# Patient Record
Sex: Female | Born: 1965
Health system: Southern US, Community
[De-identification: ages and names within clinical notes are randomized; demographics above are authoritative.]

## PROBLEM LIST (undated history)

## (undated) DIAGNOSIS — K432 Incisional hernia without obstruction or gangrene: Secondary | ICD-10-CM

## (undated) DIAGNOSIS — M1990 Unspecified osteoarthritis, unspecified site: Secondary | ICD-10-CM

## (undated) DIAGNOSIS — I10 Essential (primary) hypertension: Secondary | ICD-10-CM

## (undated) DIAGNOSIS — C801 Malignant (primary) neoplasm, unspecified: Secondary | ICD-10-CM

## (undated) DIAGNOSIS — R569 Unspecified convulsions: Secondary | ICD-10-CM

## (undated) HISTORY — DX: Morbid (severe) obesity due to excess calories: E66.01

## (undated) HISTORY — DX: Unspecified convulsions: R56.9

## (undated) HISTORY — DX: Incisional hernia without obstruction or gangrene: K43.2

## (undated) HISTORY — PX: BREAST EXCISIONAL BIOPSY: SUR124

## (undated) HISTORY — DX: Unspecified osteoarthritis, unspecified site: M19.90

---

## 1988-02-07 HISTORY — PX: GALLBLADDER SURGERY: SHX652

## 1992-02-07 HISTORY — PX: BREAST CYST EXCISION: SHX579

## 1998-06-04 ENCOUNTER — Other Ambulatory Visit: Admission: RE | Admit: 1998-06-04 | Discharge: 1998-06-04 | Payer: Self-pay | Admitting: Family Medicine

## 1998-11-05 ENCOUNTER — Other Ambulatory Visit: Admission: RE | Admit: 1998-11-05 | Discharge: 1998-11-05 | Payer: Self-pay | Admitting: Obstetrics and Gynecology

## 1998-11-05 ENCOUNTER — Encounter (INDEPENDENT_AMBULATORY_CARE_PROVIDER_SITE_OTHER): Payer: Self-pay | Admitting: Specialist

## 2004-11-21 ENCOUNTER — Other Ambulatory Visit: Admission: RE | Admit: 2004-11-21 | Discharge: 2004-11-21 | Payer: Self-pay | Admitting: Family Medicine

## 2005-02-06 HISTORY — PX: VAGINA SURGERY: SHX829

## 2005-02-16 ENCOUNTER — Encounter (INDEPENDENT_AMBULATORY_CARE_PROVIDER_SITE_OTHER): Payer: Self-pay | Admitting: Specialist

## 2005-02-16 ENCOUNTER — Ambulatory Visit (HOSPITAL_COMMUNITY): Admission: RE | Admit: 2005-02-16 | Discharge: 2005-02-16 | Payer: Self-pay | Admitting: Obstetrics and Gynecology

## 2005-12-23 ENCOUNTER — Emergency Department (HOSPITAL_COMMUNITY): Admission: EM | Admit: 2005-12-23 | Discharge: 2005-12-23 | Payer: Self-pay | Admitting: Emergency Medicine

## 2007-10-24 ENCOUNTER — Emergency Department (HOSPITAL_COMMUNITY): Admission: EM | Admit: 2007-10-24 | Discharge: 2007-10-24 | Payer: Self-pay | Admitting: Family Medicine

## 2008-02-07 HISTORY — PX: ABDOMINAL HYSTERECTOMY: SHX81

## 2008-02-07 HISTORY — PX: BREAST CYST ASPIRATION: SHX578

## 2008-02-19 ENCOUNTER — Other Ambulatory Visit: Admission: RE | Admit: 2008-02-19 | Discharge: 2008-02-19 | Payer: Self-pay | Admitting: Family Medicine

## 2008-03-14 ENCOUNTER — Emergency Department (HOSPITAL_COMMUNITY): Admission: EM | Admit: 2008-03-14 | Discharge: 2008-03-14 | Payer: Self-pay | Admitting: Emergency Medicine

## 2008-03-14 ENCOUNTER — Inpatient Hospital Stay (HOSPITAL_COMMUNITY): Admission: AD | Admit: 2008-03-14 | Discharge: 2008-03-15 | Payer: Self-pay | Admitting: Obstetrics and Gynecology

## 2008-04-01 ENCOUNTER — Encounter: Admission: RE | Admit: 2008-04-01 | Discharge: 2008-04-01 | Payer: Self-pay | Admitting: Obstetrics and Gynecology

## 2008-10-13 ENCOUNTER — Encounter: Admission: RE | Admit: 2008-10-13 | Discharge: 2008-10-13 | Payer: Self-pay | Admitting: Family Medicine

## 2008-12-24 ENCOUNTER — Emergency Department (HOSPITAL_COMMUNITY): Admission: EM | Admit: 2008-12-24 | Discharge: 2008-12-24 | Payer: Self-pay | Admitting: Emergency Medicine

## 2010-02-27 ENCOUNTER — Encounter: Payer: Self-pay | Admitting: Obstetrics and Gynecology

## 2010-03-07 ENCOUNTER — Other Ambulatory Visit: Payer: Self-pay | Admitting: General Surgery

## 2010-03-07 DIAGNOSIS — K469 Unspecified abdominal hernia without obstruction or gangrene: Secondary | ICD-10-CM

## 2010-03-14 ENCOUNTER — Other Ambulatory Visit: Payer: Self-pay

## 2010-05-11 LAB — BASIC METABOLIC PANEL
BUN: 11 mg/dL (ref 6–23)
Chloride: 105 mEq/L (ref 96–112)
GFR calc non Af Amer: >60 mL/min (ref >60)
Potassium: 4 mEq/L (ref 3.5–5.1)
Sodium: 137 mEq/L (ref 135–145)

## 2010-05-24 LAB — URINALYSIS, ROUTINE W REFLEX MICROSCOPIC
Bilirubin Urine: NEGATIVE
Glucose, UA: NEGATIVE mg/dL
Ketones, ur: NEGATIVE mg/dL
Leukocytes, UA: NEGATIVE
Protein, ur: NEGATIVE mg/dL
pH: 5 (ref 5.0–8.0)

## 2010-05-24 LAB — WET PREP, GENITAL
Clue Cells Wet Prep HPF POC: NONE SEEN
Trich, Wet Prep: NONE SEEN
Yeast Wet Prep HPF POC: NONE SEEN

## 2010-05-24 LAB — CBC
HCT: 39.5 % (ref 36.0–46.0)
Hemoglobin: 13.4 g/dL (ref 12.0–15.0)
MCV: 96.5 fL (ref 78.0–100.0)
WBC: 12.8 10*3/uL — ABNORMAL HIGH (ref 4.0–10.5)

## 2010-05-24 LAB — POCT URINALYSIS DIP (DEVICE)
Bilirubin Urine: NEGATIVE
Ketones, ur: NEGATIVE mg/dL
Nitrite: NEGATIVE
Protein, ur: NEGATIVE mg/dL

## 2010-05-24 LAB — DIFFERENTIAL
Eosinophils Absolute: 0 10*3/uL (ref 0.0–0.7)
Eosinophils Relative: 0 % (ref 0–5)
Lymphocytes Relative: 12 % (ref 12–46)
Lymphs Abs: 1.6 10*3/uL (ref 0.7–4.0)
Monocytes Absolute: 0.5 10*3/uL (ref 0.1–1.0)
Monocytes Relative: 4 % (ref 3–12)

## 2010-05-24 LAB — URINE MICROSCOPIC-ADD ON

## 2010-05-24 LAB — GC/CHLAMYDIA PROBE AMP, GENITAL
Chlamydia, DNA Probe: NEGATIVE
GC Probe Amp, Genital: NEGATIVE

## 2010-05-24 LAB — POCT PREGNANCY, URINE: Preg Test, Ur: NEGATIVE

## 2010-06-24 NOTE — H&P (Signed)
Patricia Pena, Patricia Pena               ACCOUNT NO.:  [id number]   MEDICAL RECORD NO.:  [id number]          PATIENT TYPE:  AMB   LOCATION:  SDC                           FACILITY:  [facility]   PHYSICIAN:  Janine Limbo, M.D.DATE OF BIRTH:  Feb 06, 1966   DATE OF ADMISSION:  02/16/2005  DATE OF DISCHARGE:                                HISTORY & PHYSICAL   HISTORY OF PRESENT ILLNESS:  Patricia Pena is a 45 year old female, para 3, 0,  1, 3, who presents for a hysteroscopy with dilatation and curettage, and  NovaSure ablation. The patient has been seen at the [facility] and Gynecology division of [facility]. She  complains of irregular and heavy bleeding for the past 5 weeks. She has been  treated with oral contraceptives in the past.   OBSTETRICAL HISTORY:  The patient has had three term deliveries and one  first trimester pregnancy termination.   PAST MEDICAL HISTORY:  The patient has a past history of seizures. She  denies hypertension and diabetes.   ALLERGIES:  No known drug allergies.   SOCIAL HISTORY:  The patient smokes 1/2 a pack of cigarettes to 1 pack of  cigarettes each day. She drinks alcohol socially. She denies recreational  drug use.   REVIEW OF SYSTEMS:  The patient is currently not sexually active. She does  complain of mild abdominal pain and increasing mood swings.   FAMILY HISTORY:  Noncontributory.   PHYSICAL EXAMINATION:  Weight is 291 pounds. Height is 5 feet, 4 inches.  HEENT:  Within normal limits.  CHEST:  Clear.  HEART:  Regular rate and rhythm.  ABDOMEN:  Nontender.  PELVIC EXAM:  External genitalia is normal. Vagina is normal except for  relaxation. The cervix is nontender and no lesions are present. Uterus is  normal size, shape and consistency. Adnexa no masses. Rectovaginal exam  confirms.   ASSESSMENT:  1.  Irregular bleeding.  2.  Obesity (weight 291 pounds).   PLAN:  The patient will undergo a NovaSure  endometrial ablation after  hysteroscopy with dilatation and curettage. The patient understands the  indications or her procedure and she accepts the risk of, but limited to,  anesthetic complications, bleeding, infections, and possible damage to the  surrounding organs. The patient understands that the  literature suggests that there is a 70% chance that she will not have a  cycle after this procedure.  There is a 22% chance that she will continue to have bleeding but that her  bleeding should be better than it was in the past. There is an 8% chance  that there will be no change in her bleeding pattern.      Janine Limbo, M.D.  Electronically Signed     AVS/MEDQ  D:  02/12/2005  T:  02/12/2005  Job:  [id number]   cc:   Tally Joe, M.D.  Fax: [phone]

## 2010-06-24 NOTE — Op Note (Signed)
NAMEROYAL, BEIRNE               ACCOUNT NO.:  [id number]   MEDICAL RECORD NO.:  [id number]          PATIENT TYPE:  AMB   LOCATION:  SDC                           [facility]  [facility]   PHYSICIAN:  Janine Limbo, M.D.DATE OF BIRTH:  [birth year]   DATE OF PROCEDURE:  02/16/2005  DATE OF DISCHARGE:                                 OPERATIVE REPORT   PREOPERATIVE DIAGNOSES:  1.  Irregular menstrual cycles.  2.  Obesity (weight 291 pounds).   POSTOPERATIVE DIAGNOSES:  1.  Irregular menstrual cycles.  2.  Obesity (weight 291 pounds).   PROCEDURES:  1.  Hysteroscopy.  2.  Dilatation and curettage.  3.  NovaSure ablation.   SURGEON:  Janine Limbo, M.D.   FIRST ASSISTANT:  None.   ANESTHETIC:  General.   DISPOSITION:  Patricia Pena is a 45 year old female with the above-mentioned  diagnoses.  She understands the indications for her surgical procedure and  she accepts the risks of, but not limited to, anesthetic complications,  bleeding, infection, and possible damage to surrounding organs.   FINDINGS:  The uterus was upper limits normal size.  The uterus sounded to  9.5-10 cm.  The cervical length was 3 cm.  That left a cavity length of 6.5-  7 cm.  The cavity width was 4.6 cm.  No pathologic lesions were noted within  the uterus.  There was a question of a small polyp on the posterior surface  of the lower uterine segment.  No adnexal masses were appreciated.   PROCEDURE:  The patient was taken to the operating room, where a general  anesthetic was given.  The patient's abdomen, perineum, and vagina were  prepped with multiple layers of Betadine.  The bladder was drained of urine.  Examination under anesthesia was performed.  The patient was sterilely  draped.  A paracervical block was placed using 10 mL of 0.5% Marcaine with  epinephrine.  Another 10 mL of 0.5% Marcaine with epinephrine were injected  directly into the cervix.  An endocervical curettage was performed.   The  uterus sounded to 9.5-10 cm.  The cervix measured 3 cm.  The cavity was  slightly dilated.  The diagnostic hysteroscope was inserted and the cavity  was inspected carefully.  Pictures were taken.  No lesions were noted except  for a possible few small polyps on the posterior surface of the lower  uterine segment.  The hysteroscope was removed and the cavity was then  curetted until it was felt to be completely clean.  We then inserted the  NovaSure device into the uterus.  The cavity width was noted to be 4.6 cm.  A power setting of 164 was selected.  A test procedure was performed and the  cavity was noted to be intact.  The cavity was then ablated using the  NovaSure device.  Ablation time was 70 seconds.  The patient tolerated her  procedure well.  The fluid deficit was noted to be 50 mL.  All instruments  were removed.  The exam was repeated and again the uterus was noted to be  firm.  The patient was awakened from her anesthetic without difficulty and  then taken to the recovery room in stable condition.   FOLLOW-UP INSTRUCTIONS:  The patient was given a prescription for Darvocet  and she will take one or two tablets every four hours as needed for pain.  She will return to see Dr. Stefano Gaul in two weeks for a follow-up  examination.  She was given a copy of the postoperative instruction sheet as  prepared by the [facility] for patients who have  undergone a dilatation and curettage.  She knows to call with questions or  concerns.      Janine Limbo, M.D.  Electronically Signed     AVS/MEDQ  D:  02/16/2005  T:  02/16/2005  Job:  [id number]   cc:   Tally Joe, M.D.  Fax: [phone]

## 2011-01-09 ENCOUNTER — Telehealth (INDEPENDENT_AMBULATORY_CARE_PROVIDER_SITE_OTHER): Payer: Self-pay

## 2011-01-09 ENCOUNTER — Encounter (INDEPENDENT_AMBULATORY_CARE_PROVIDER_SITE_OTHER): Payer: Self-pay | Admitting: General Surgery

## 2011-01-09 NOTE — Telephone Encounter (Signed)
Note written and at front for patient pick up. Patient made aware.

## 2011-01-09 NOTE — Telephone Encounter (Signed)
i haven't seen the pt since January 2012.  I'm willing to write a letter stating that the pt has an incisional hernia and should avoid lifting items greater than 15-20 pounds but that's all I will put in the letter.

## 2011-01-09 NOTE — Telephone Encounter (Signed)
Pt called requesting a letter from Dr Andrey Campanile stating that the pt still has a abdominal hernia b/c her son goes to [employer] living in the dorm. The son is going to move back home from the dorm to help the pt around the home lifting stuff b/c of the abdominal hernia. The pt will continue to go to [employer] but not live in the dorm. Pls call the pt when letter is ready for them.Hulda Humphrey

## 2011-02-09 ENCOUNTER — Encounter (INDEPENDENT_AMBULATORY_CARE_PROVIDER_SITE_OTHER): Payer: Self-pay | Admitting: General Surgery

## 2011-02-10 ENCOUNTER — Encounter (INDEPENDENT_AMBULATORY_CARE_PROVIDER_SITE_OTHER): Payer: Self-pay | Admitting: General Surgery

## 2011-02-10 ENCOUNTER — Ambulatory Visit (INDEPENDENT_AMBULATORY_CARE_PROVIDER_SITE_OTHER): Payer: BC Managed Care – PPO | Admitting: General Surgery

## 2011-02-10 VITALS — BP 138/90 | HR 72 | Temp 97.5°F | Resp 18 | Ht 63.0 in | Wt 324.2 lb

## 2011-02-10 DIAGNOSIS — K432 Incisional hernia without obstruction or gangrene: Secondary | ICD-10-CM

## 2011-02-10 NOTE — Patient Instructions (Signed)
We will refer to [facility] in [place] for evaluation of your abdominal hernia

## 2011-02-13 ENCOUNTER — Encounter (INDEPENDENT_AMBULATORY_CARE_PROVIDER_SITE_OTHER): Payer: Self-pay | Admitting: General Surgery

## 2011-02-13 NOTE — Progress Notes (Signed)
Patient ID: Patricia Pena, female   DOB: [birth year], 46 y.o.   MRN: [id number]  Chief Complaint  Patient presents with  . Follow-up    LTF eval incisional hernia... LOV yr ago    HPI Patricia Pena is a 46 y.o. female.  HPI 46 year old morbidly obese Caucasian female comes in for long-term followup regarding her incisional hernia. I last saw her in January 2012. I initially saw her in January 2011.  Briefly in review, the patient had undergone a laparoscopic converted to an open abdominal hysterectomy in October 2010 at [facility]. She formed an incisional hernia seen after her surgery. She saw a Careers adviser at [facility] who recommended weight loss prior to proceeding with hernia repair given her obesity. She was also told that she needed to wait several months prior to having her hernia repaired. She saw me for second opinion in January 2011. At that time she was asymptomatic with respect to her hernia except for some occasional tenderness. I recommended weight loss first in order to decrease her chance for perioperative complications as well as long-term risk of hernia recurrence. I spent a long time counseling her as to the advantages and benefits of weight loss prior to definitive hernia repair. We also talked about surgical weight loss options.  I saw her again in April 2011 and she has actually lost some weight. She lost approximately 22 pounds weighing in at 290 pounds. She had been taking phentermine. Since she was still essentially asymptomatic except for some occasional discomfort I recommended ongoing weight loss. I saw her in January 2012 unfortunately she has gained 26 pounds. We began discussed the pros and cons of hernia repair with a BMI greater than 55. She also been complaining of a lot of fatigue and I recommended that she see her primary care doctor for evaluation and screening for diabetes mellitus and/or thyroid abnormalities. However she refused.  She comes in today for long-term  followup. She states overall she's doing the same. She has occasional pain and tenderness in her abdomen. She denies any vomiting. She denies any diarrhea or constipation. She has some occasional nausea. Past Medical History  Diagnosis Date  . Arthritis   . Incisional hernia without mention of obstruction or gangrene   . Seizures     last one was in 1985  . Morbid obesity     Past Surgical History  Procedure Date  . Gallbladder surgery 1990  . Abdominal hysterectomy 2010    at [facility]  . Vagina surgery 2007  . Breast cyst excision 1994    right breast    Family History  Problem Relation Age of Onset  . Cancer Father     lung    Social History History  Substance Use Topics  . Smoking status: Current Everyday Smoker -- 0.2 packs/day  . Smokeless tobacco: Not on file  . Alcohol Use: Yes     occasionally    Allergies  Allergen Reactions  . Codeine     headache    Current Outpatient Prescriptions  Medication Sig Dispense Refill  . CALCIUM-VITAMIN D PO Take 1,200 mg by mouth daily.        . Multiple Vitamin (MULTIVITAMIN) tablet Take 1 tablet by mouth daily.          Review of Systems Review of Systems  Constitutional: Positive for fatigue. Negative for fever, appetite change and unexpected weight change.  HENT: Negative for nosebleeds and neck pain.   Eyes: Negative for  photophobia and visual disturbance.  Respiratory: Negative for chest tightness.   Cardiovascular: Negative for chest pain and palpitations.       +DOE  Gastrointestinal:       See hpi  Genitourinary: Negative for dysuria, hematuria and difficulty urinating.  Musculoskeletal:       "arthritis"  Skin: Negative.   Neurological: Positive for headaches. Negative for tremors, seizures (none in long time), speech difficulty and light-headedness. Facial asymmetry: some occassional.  Hematological: Negative.   Psychiatric/Behavioral: Negative.     Blood pressure 138/90, pulse 72, temperature 97.5 F  (36.4 C), temperature source Temporal, resp. rate 18, height 5\' 3"  (1.6 m), weight 324 lb 3.2 oz (147.056 kg).  Physical Exam Physical Exam  Vitals reviewed. Constitutional: She is oriented to person, place, and time. She appears well-developed and well-nourished. No distress.       Morbidly obese  HENT:  Head: Normocephalic and atraumatic.  Eyes: Conjunctivae are normal. No scleral icterus.  Neck: Normal range of motion. Neck supple. No JVD present. No tracheal deviation present.  Cardiovascular: Normal rate, regular rhythm and normal heart sounds.   Pulmonary/Chest: Effort normal and breath sounds normal. No respiratory distress. She has no wheezes.  Abdominal: Soft. Bowel sounds are normal. She exhibits no distension. There is no tenderness. A hernia is present. Hernia confirmed positive in the ventral area.         Right subcostal incision Lower midline incision Large central truncal obesity Large lower midline incisional hernia to right of midline. Soft, nontender. Can not really identify fascial edges  Musculoskeletal: She exhibits no edema and no tenderness.  Neurological: She is alert and oriented to person, place, and time.  Skin: Skin is warm and dry.  Psychiatric: She has a normal mood and affect. Her behavior is normal. Thought content normal.    Data Reviewed My office notes from Jan 2012, April 2011, Jan 2011 CT ABD-pelvis from Nov 2010:  CT ABDOMEN  Findings: There is a 4 mm nodular density along the right minor  fissure. Stable pleural-based density in the right middle lobe.  There is no evidence for free air. The patient now has a large  periumbilical ventral hernia which contains loops of small bowel.  There is a mild amount of edema inferior to this hernia. No  evidence for small bowel dilatation or obstruction. The  gallbladder has been removed. There is a normal appearance of the  liver, portal venous system, pancreas, spleen, adrenal glands and  kidneys.  Normal appearance of the appendix in the right lower  quadrant of the abdomen. There is no significant free fluid or  lymphadenopathy in the abdomen. No acute bony abnormalities.   IMPRESSION:  Large ventral hernia containing loops of small bowel. No evidence  for small bowel dilatation or obstruction.  4 mm nodule along the right minor fissure. If the patient is at  high risk for bronchogenic carcinoma, follow-up chest CT at 1 year  is recommended. If the patient is at low risk, no follow-up is  needed. This recommendation follows the consensus statement:  Guidelines for Management of Small Pulmonary Nodules Detected on  CT Scans: A Statement from the Fleischner Society as published in  Radiology 2005; 237:395-400. Available online at:  DietDisorder.cz.   CT PELVIS  Findings: The patient has a stable nodular structure in the right  lower abdomen that appears to represent adnexal tissue. Uterus  and left adnexa tissue had been surgically removed. The bladder is  decompressed. No significant  free fluid or lymphadenopathy in the  pelvis. No acute bony abnormalities.  IMPRESSION:  Status post hysterectomy and removal of left adnexa tissue. The  right adnexa tissue appears to remain in place.   Assessment    Large incisional hernia Morbid obesity 57 Tobacco use    Plan    Unfortunately the patient has gained weight since her last visit one year ago. Her weight at that time was 316 pounds. Her weight today is 324.2 pounds with a BMI of 57. Her incisional hernia is quite large. Given the fascial separation and her morbid obesity I believe she would need an open repair at this point. However she is at high risk for perioperative complications as well as recurrence. I again spent time explaining to her the benefits of weight loss. However the patient says that she likes her weight and that she is comfortable with her size and that she just wants her  hernia fixed.  I explained to the patient that given her morbid obesity and large abdominal hernia and that she would be best served by being evaluated by a dedicated hernia center. I explained that I would not feel comfortable operating on her. She said she understood and we both agreed on a referral to a hernia center such as [facility] in [place].  With respect to the 4 mm right lung nodule seen on the CT scan in 2010, we had ordered a followup CT scan in January 2012. However, the patient never showed up at the scan performed. The patient canceled the CT scan and did not reschedule. I think she is at low risk for this being a bronchogenic carcinoma. I will defer definitive management of this to her primary care physician.  F/u prn  Mary Sella. Andrey Campanile, MD, FACS General, Bariatric, & Minimally Invasive Surgery [facility], [place]        [facility] M 02/13/2011, 5:05 PM

## 2011-04-17 ENCOUNTER — Telehealth (INDEPENDENT_AMBULATORY_CARE_PROVIDER_SITE_OTHER): Payer: Self-pay | Admitting: General Surgery

## 2011-04-18 ENCOUNTER — Telehealth (INDEPENDENT_AMBULATORY_CARE_PROVIDER_SITE_OTHER): Payer: Self-pay | Admitting: General Surgery

## 2011-04-18 NOTE — Telephone Encounter (Signed)
Rec'd call from pt.  She admits she has been referred by Dr. Andrey Campanile to Dr. Anda Kraft at [facility] for hernia surgery, secondary to her weight.  At [facility] she was instructed to lose weight at well.  She states she has now "pulled something in her stomach" and has a hard, non-painful bulge at the old incisional site. Pt has called [facility] who rec she call her PCP.  Pt is asking if coming to see Dr. Andrey Campanile would be a possibility, but since he has already referred her to Dr. Anda Kraft, I reiterated that she should contact her PCP.  She understands and will comply.

## 2011-04-18 NOTE — Telephone Encounter (Signed)
Patient called back and spoke with Britta Mccreedy. Made her aware patient should follow up with Dr Heniford. She will let patient know.

## 2011-06-12 ENCOUNTER — Encounter (HOSPITAL_COMMUNITY): Payer: Self-pay | Admitting: Pharmacy Technician

## 2011-06-12 ENCOUNTER — Other Ambulatory Visit: Payer: Self-pay | Admitting: Cardiology

## 2011-06-13 NOTE — H&P (Signed)
    Patient: Patricia Pena, Patricia Pena Provider: Tinesha Siegrist, MD  DOB: 03/09/1965 Age: 45 Y Sex: Female Date: 06/09/2011  Phone: [phone]   Address: [address], [place], [address]  Pcp: CANDACE SMITH       Subjective:     CC:    1. MS/f/u to stress test.        HPI:  General:  45-year-old female with morbid obesity here for preoperative risk stratification At the request of Katherine Pickett, Robert Reade. I saw her previously in 2010 and performed a stress test as well as echocardiogram at that time for preoperative risk stratification. Katherine Pickett recently saw her for a presurgical workup and noticed an abnormal EKG. Her prior EKG showed fairly nonspecific ST T wave changes. Her most recent EKG however demonstrates T-wave inversion in the precordial leads concerning for possible ischemia. Because of this change, I suggested further evaluation.  Diaphram hurts from hernia. Collar bone hurts during epitical. She is experiencing chest pain with stressful situations such as argument with son. She does say she is able to exercise on ellipitcal low level.  I performed NUC stress: showed anterior wall reversible defect which was a change from prior study in comparison. Her ECG as well showed precordial and inferior TWI.  Because of these changes (can not fully explain by breast attenuation) I recommended cardiac cath. Radial approach would be feasable given her abdominal obesity. She was teary eyed during discussion, mother was here as well. I told her that I did not feel comfortable allowing her to proceed with surgery at this point without further evaluation.  She wants to think about it. I went over risks and benefits (stroke MI death) of cath. She says she will call on Monday. .        Medical History: CIN-1, cryotherapy, Fibroids.        Family History:        Social History:  General:  History of smoking  cigarettes: Former smoker no Smoking, quit in 2008 after 20  years, started back 2012 and quit again January 2013 .  no Alcohol.  Occupation: work at [employer]. .        Medications: CLA 1000 MG Capsule as directed , Vitamin D 2000 UNIT Capsule 1 capsule each day, Medication List reviewed and reconciled with the patient       Allergies: Codeine (for allergy): headache.       Objective:     Vitals: Wt 299, Wt change 4 lb, Ht 62, BMI 54.68, Pulse sitting 88, BP sitting 136/90.       Examination:        Assessment:     Assessment:  1. Abnormal cardiovascular stress test - 794.39 (Primary)    Plan:     1. Abnormal cardiovascular stress test  See above discussion (25 minutes spent with patient and mother in counselling and review of testing.).     

## 2011-06-14 ENCOUNTER — Ambulatory Visit (HOSPITAL_COMMUNITY)
Admission: RE | Admit: 2011-06-14 | Discharge: 2011-06-14 | Disposition: A | Discharge status: Home / Self Care | Payer: BC Managed Care – PPO | Source: Ambulatory Visit | Attending: Cardiology | Admitting: Cardiology

## 2011-06-14 ENCOUNTER — Encounter (HOSPITAL_COMMUNITY)
Admission: RE | Disposition: A | Discharge status: Home / Self Care | Payer: Self-pay | Source: Ambulatory Visit | Attending: Cardiology

## 2011-06-14 DIAGNOSIS — R079 Chest pain, unspecified: Secondary | ICD-10-CM | POA: Insufficient documentation

## 2011-06-14 HISTORY — PX: LEFT HEART CATHETERIZATION WITH CORONARY ANGIOGRAM: SHX5451

## 2011-06-14 SURGERY — LEFT HEART CATHETERIZATION WITH CORONARY ANGIOGRAM
Anesthesia: LOCAL

## 2011-06-14 MED ORDER — ASPIRIN 81 MG PO CHEW
324.0000 mg | CHEWABLE_TABLET | ORAL | Status: AC
  Administered 2011-06-14: 324 mg via ORAL
  Filled 2011-06-14: qty 4

## 2011-06-14 MED ORDER — SODIUM CHLORIDE 0.9 % IV SOLN
INTRAVENOUS | Status: DC
  Administered 2011-06-14: 1000 mL via INTRAVENOUS

## 2011-06-14 MED ORDER — SODIUM CHLORIDE 0.9 % IJ SOLN: 3.0000 mL | INTRAMUSCULAR | Status: DC | PRN

## 2011-06-14 MED ORDER — DIAZEPAM 5 MG PO TABS
5.0000 mg | ORAL_TABLET | ORAL | Status: AC
  Administered 2011-06-14: 5 mg via ORAL
  Filled 2011-06-14: qty 1

## 2011-06-14 MED ORDER — SODIUM CHLORIDE 0.9 % IV SOLN: 250.0000 mL | INTRAVENOUS | Status: DC | PRN

## 2011-06-14 MED ORDER — ONDANSETRON HCL 4 MG/2ML IJ SOLN: 4.0000 mg | Freq: Four times a day (QID) | INTRAMUSCULAR | Status: DC | PRN

## 2011-06-14 MED ORDER — SODIUM CHLORIDE 0.9 % IJ SOLN: 3.0000 mL | Freq: Two times a day (BID) | INTRAMUSCULAR | Status: DC

## 2011-06-14 MED ORDER — ACETAMINOPHEN 325 MG PO TABS: 650.0000 mg | ORAL_TABLET | ORAL | Status: DC | PRN

## 2011-06-14 MED ORDER — SODIUM CHLORIDE 0.9 % IV SOLN: 1.0000 mL/kg/h | INTRAVENOUS | Status: DC

## 2011-06-14 NOTE — Interval H&P Note (Signed)
History and Physical Interval Note:  06/14/2011 9:21 AM  Patricia Pena  has presented today for surgery, with the diagnosis of abnormal stress  The various methods of treatment have been discussed with the patient and family. After consideration of risks, benefits and other options for treatment, the patient has consented to  Procedure(s) (LRB): LEFT HEART CATHETERIZATION WITH CORONARY ANGIOGRAM (N/A) as a surgical intervention .  The patients' history has been reviewed, patient examined, no change in status, stable for surgery.  I have reviewed the patients' chart and labs.  Questions were answered to the patient's satisfaction.     Patricia Pena  We discussed the risks and benefits of cardiac catheterization including stroke, heart attack, death, loss of limb, bleeding, renal involvement. She is willing to proceed

## 2011-06-14 NOTE — H&P (View-Only) (Signed)
    Patient: Patricia Pena, Patricia Pena Provider: Donato Schultz, MD  DOB: Dec 07, 1965 Age: 46 Y Sex: Female Date: 06/09/2011  Phone: [phone]   Address: [address] [place], [id number]  Pcp: CANDACE SMITH       Subjective:     CC:    1. MS/f/u to stress test.        HPI:  General:  46 year old female with morbid obesity here for preoperative risk stratification At the request of Jeri Cos. I saw her previously in 2010 and performed a stress test as well as echocardiogram at that time for preoperative risk stratification. Ricci Barker recently saw her for a presurgical workup and noticed an abnormal EKG. Her prior EKG showed fairly nonspecific ST T wave changes. Her most recent EKG however demonstrates T-wave inversion in the precordial leads concerning for possible ischemia. Because of this change, I suggested further evaluation.  Diaphram hurts from hernia. Collar bone hurts during epitical. She is experiencing chest pain with stressful situations such as argument with son. She does say she is able to exercise on ellipitcal low level.  I performed NUC stress: showed anterior wall reversible defect which was a change from prior study in comparison. Her ECG as well showed precordial and inferior TWI.  Because of these changes (can not fully explain by breast attenuation) I recommended cardiac cath. Radial approach would be feasable given her abdominal obesity. She was teary eyed during discussion, mother was here as well. I told her that I did not feel comfortable allowing her to proceed with surgery at this point without further evaluation.  She wants to think about it. I went over risks and benefits (stroke MI death) of cath. She says she will call on Monday. .        Medical History: CIN-1, cryotherapy, Fibroids.        Family History:        Social History:  General:  History of smoking  cigarettes: Former smoker no Smoking, quit in 2008 after 20  years, started back 2012 and quit again January 2013 .  no Alcohol.  Occupation: work at [employer]. .        Medications: CLA 1000 MG Capsule as directed , Vitamin D 2000 UNIT Capsule 1 capsule each day, Medication List reviewed and reconciled with the patient       Allergies: Codeine (for allergy): headache.       Objective:     Vitals: Wt 299, Wt change 4 lb, Ht 62, BMI 54.68, Pulse sitting 88, BP sitting 136/90.       Examination:        Assessment:     Assessment:  1. Abnormal cardiovascular stress test - 794.39 (Primary)    Plan:     1. Abnormal cardiovascular stress test  See above discussion (25 minutes spent with patient and mother in counselling and review of testing.).

## 2011-06-14 NOTE — Progress Notes (Signed)
Reverse Allen's test positive prior to removal of TR band at 1127 and gauze dressing applied covered with a tegaderm. Right radial dressing reassessed at 1155, CDI.  Patient given discharge instructions.

## 2011-06-14 NOTE — Discharge Instructions (Signed)
Radial Site Care Refer to this sheet in the next few weeks. These instructions provide you with information on caring for yourself after your procedure. Your caregiver may also give you more specific instructions. Your treatment has been planned according to current medical practices, but problems sometimes occur. Call your caregiver if you have any problems or questions after your procedure. HOME CARE INSTRUCTIONS  You may shower the day after the procedure.Remove the bandage (dressing) and gently wash the site with plain soap and water.Gently pat the site dry.   Do not apply powder or lotion to the site.   Do not submerge the affected site in water for 3 to 5 days.   Inspect the site at least twice daily.   Do not flex or bend the affected arm for 24 hours.   No lifting over 5 pounds (2.3 kg) for 5 days after your procedure.   Do not drive home if you are discharged the same day of the procedure. Have someone else drive you.   You may drive 24 hours after the procedure unless otherwise instructed by your caregiver.   Do not operate machinery or power tools for 24 hours.   A responsible adult should be with you for the first 24 hours after you arrive home.  What to expect:  Any bruising will usually fade within 1 to 2 weeks.   Blood that collects in the tissue (hematoma) may be painful to the touch. It should usually decrease in size and tenderness within 1 to 2 weeks.  SEEK IMMEDIATE MEDICAL CARE IF:  You have unusual pain at the radial site.   You have redness, warmth, swelling, or pain at the radial site.   You have drainage (other than a small amount of blood on the dressing).   You have chills.   You have a fever or persistent symptoms for more than 72 hours.   You have a fever and your symptoms suddenly get worse.   Your arm becomes pale, cool, tingly, or numb.   You have heavy bleeding from the site. Hold pressure on the site.  Document Released: 02/25/2010  Document Revised: 01/12/2011 Document Reviewed: 02/25/2010 Exit[employer] Patient Information 2012 [employer]. 

## 2011-06-14 NOTE — CV Procedure (Signed)
PROCEDURE:  Left heart catheterization with selective coronary angiography, left ventriculogram via the radial artery approach.  INDICATIONS:  46 year old female with morbid obesity, ventral hernia awaiting surgical repair. She came to me for preoperative risk assessment where a nuclear stress test demonstrated a reversible defect in the anterior wall distribution. Ejection fraction was 50%. She has occasional chest discomfort especially when under stressful situations, such as arguing with her son. She is however able to exercise on an elliptical machine without any serious difficulty. Her EKG is also abnormal showing T-wave inversion in the precordial leads.  The risks, benefits, and details of the procedure were explained to the patient, including possibilities of stroke, heart attack, death, renal impairment, arterial damage, bleeding.  The patient verbalized understanding and wanted to proceed.  Informed written consent was obtained.  PROCEDURE TECHNIQUE:  Allen's test was performed pre-and post procedure and was normal. The right radial artery site was prepped and draped in a sterile fashion. One percent lidocaine was used for local anesthesia. Using the modified Seldinger technique a 5 French hydrophilic sheath was inserted into the radial artery without difficulty. 3 mg of verapamil was administered via the sheath. A Judkins right #4 catheter with the guidance of a Versicore wire was placed in the right coronary cusp and selectively cannulated the right coronary artery. After traversing the aortic arch, 5000 units of heparin IV was administered. A Judkins left #3.5 catheter was used to selectively cannulate the left main artery. Multiple views with hand injection of Omnipaque were obtained. Catheter a pigtail catheter was used to cross into the left ventricle, hemodynamics were obtained, and a left ventriculogram was performed in the RAO position with power injection. Following the procedure, sheath  was removed, patient was hemodynamically stable, hemostasis was maintained with a Terumo T band.   CONTRAST:  Total of 85 ml.    FLOUROSCOPY TIME: 3.7 min.  COMPLICATIONS:  None.    HEMODYNAMICS:  Aortic pressure was 133/73, mean 99 mmHg; LV systolic pressure was 133 mmHg; LVEDP16 mmHg.  There was no gradient between the left ventricle and aorta.    ANGIOGRAPHIC DATA:    Left main: No angiographically significant disease  Left anterior descending (LAD): 3 significant diagonal branches. Minor luminal irregularities especially in the distal vessel. Overall, no angiographically significant disease.  Circumflex artery (CIRC):  2 obtuse marginal branches, minor luminal irregularities. Small distal caliber vessel. No angiographically significant disease.  Right coronary artery (RCA): Minor tapering at the ostium of the right coronary artery. The distal vessel once again demonstrates small caliber arteries, highly tortuous. No angiographically significant disease present.  LEFT VENTRICULOGRAM:  Left ventricular angiogram was done in the 30 RAO projection and revealed normal left ventricular wall motion and systolic function with an estimated ejection fraction of 55%.   IMPRESSIONS:  No angiographically significant CAD, however distal vasculature is small in caliber. Abnormality on nuclear stress test likely secondary to breast attenuation. Normal left ventricular systolic function.  LVEDP 16 mmHg.  Ejection fraction 55%.  RECOMMENDATION:  Continue with aggressive medical management, weight loss. She may proceed with ventral hernia repair.

## 2011-06-15 MED FILL — Nicardipine HCl IV Soln 2.5 MG/ML: INTRAVENOUS | Qty: 1 | Status: AC

## 2011-06-16 MED FILL — Midazolam HCl Inj 2 MG/2ML (Base Equivalent): INTRAMUSCULAR | Qty: 2 | Status: AC

## 2011-06-16 MED FILL — Fentanyl Citrate Inj 0.05 MG/ML: INTRAMUSCULAR | Qty: 2 | Status: AC

## 2011-06-16 MED FILL — Heparin Sodium (Porcine) Inj 1000 Unit/ML: INTRAMUSCULAR | Qty: 10 | Status: AC

## 2011-11-01 ENCOUNTER — Other Ambulatory Visit (HOSPITAL_COMMUNITY): Payer: Self-pay | Admitting: Surgery

## 2011-11-01 DIAGNOSIS — R222 Localized swelling, mass and lump, trunk: Secondary | ICD-10-CM

## 2011-11-01 DIAGNOSIS — IMO0002 Reserved for concepts with insufficient information to code with codable children: Secondary | ICD-10-CM

## 2013-03-07 ENCOUNTER — Ambulatory Visit (INDEPENDENT_AMBULATORY_CARE_PROVIDER_SITE_OTHER): Payer: BC Managed Care – PPO | Admitting: Family Medicine

## 2013-03-07 DIAGNOSIS — Z111 Encounter for screening for respiratory tuberculosis: Secondary | ICD-10-CM

## 2013-03-07 DIAGNOSIS — Z0000 Encounter for general adult medical examination without abnormal findings: Secondary | ICD-10-CM

## 2013-03-07 NOTE — Patient Instructions (Signed)
Please try to locate record of your MMR vaccine and hepatitis B vaccination.  You need to have had a tetanus shot within the last 10 years. If your primary care does not have record of one, you will need one.  The PPD skin test will be applied today. You need to return between 48 and 72 hours from now to have the skin test read and documented.  A major goal should be regular exercise and working on eating less in order to achieve significant weight loss.  Return if problems

## 2013-03-07 NOTE — Progress Notes (Signed)

## 2013-03-07 NOTE — Progress Notes (Signed)
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 She has a form that needs to be completed. She has no major acute medical complaints today.  Past medical history: Operations: Cardiac cath reportedly normal prior to getting her other surgeries. She had a ventral herniorrhaphy repair, followed by an infection, then underwent a panniculectomy. 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 She is divorced. Lives with her mother and her 87 39's-year-old sons. Has a little exercise on her elliptical, not frequently. Has 5 dogs she walks occasionally.  Review of systems: Constitutional: No complaints HEENT: Unremarkable except she wears reading glasses. Respiratory: Unremarkable Cardiovascular: Unremarkable Gastrointestinal: Unremarkable Genitourinary: Unremarkable Musculoskeletal: Unremarkable Dermatologic: Unremarkable Neurologic: Unremarkable Psychiatric: Unremarkable  Physical exam: Morbidly obese lady in no major acute distress. TMs normal. Eyes PERRLA. Benign. Throat clear. Neck supple without nodes thyromegaly. No carotid bruits. Chest clear to auscultation. Heart regular without murmurs gallops or arrhythmias. And soft without mass or tenderness. Extremities without edema. Skin has many skin tags around her neck, probably 100 of them. Deep tender reflexes symmetrical. Spine normal.  Assessment: Physical examination Morbid obesity Fibrocutaneous skin tags  Plan: Patient declined routine screening lab Patient does not have a record of her vaccinations. She was told she needs to come up with that or have MMR titer and HBsAg done. She probably needs a tetanus vaccine anyhow. She is status.

## 2013-03-09 ENCOUNTER — Ambulatory Visit (INDEPENDENT_AMBULATORY_CARE_PROVIDER_SITE_OTHER): Payer: BC Managed Care – PPO

## 2013-03-09 DIAGNOSIS — Z111 Encounter for screening for respiratory tuberculosis: Secondary | ICD-10-CM

## 2013-03-10 LAB — TB SKIN TEST
Induration: 0 mm
TB SKIN TEST: NEGATIVE

## 2013-03-10 NOTE — Addendum Note (Signed)
Addended by: Kyra Manges on: 03/10/2013 10:58 AM   Modules accepted: Orders

## 2013-03-11 ENCOUNTER — Ambulatory Visit (INDEPENDENT_AMBULATORY_CARE_PROVIDER_SITE_OTHER): Payer: BC Managed Care – PPO | Admitting: Family Medicine

## 2013-03-11 DIAGNOSIS — Z9229 Personal history of other drug therapy: Secondary | ICD-10-CM

## 2013-03-11 DIAGNOSIS — Z23 Encounter for immunization: Secondary | ICD-10-CM

## 2013-08-14 ENCOUNTER — Encounter: Payer: Self-pay | Admitting: Cardiology

## 2013-08-14 ENCOUNTER — Telehealth: Payer: Self-pay | Admitting: *Deleted

## 2013-08-14 DIAGNOSIS — Z87898 Personal history of other specified conditions: Secondary | ICD-10-CM | POA: Insufficient documentation

## 2013-08-14 DIAGNOSIS — M1990 Unspecified osteoarthritis, unspecified site: Secondary | ICD-10-CM | POA: Insufficient documentation

## 2013-08-14 NOTE — Telephone Encounter (Signed)
Erroneous encounter

## 2013-12-10 NOTE — Progress Notes (Signed)
error 

## 2014-01-15 ENCOUNTER — Encounter (HOSPITAL_COMMUNITY): Payer: Self-pay | Admitting: Cardiology

## 2015-08-02 ENCOUNTER — Ambulatory Visit (INDEPENDENT_AMBULATORY_CARE_PROVIDER_SITE_OTHER): Payer: BLUE CROSS/BLUE SHIELD | Admitting: Family Medicine

## 2015-08-02 VITALS — BP 154/98 | HR 98 | Temp 98.2°F | Resp 20 | Ht 63.0 in | Wt 291.0 lb

## 2015-08-02 DIAGNOSIS — R030 Elevated blood-pressure reading, without diagnosis of hypertension: Secondary | ICD-10-CM

## 2015-08-02 DIAGNOSIS — R000 Tachycardia, unspecified: Secondary | ICD-10-CM

## 2015-08-02 DIAGNOSIS — Z566 Other physical and mental strain related to work: Secondary | ICD-10-CM

## 2015-08-02 DIAGNOSIS — R0989 Other specified symptoms and signs involving the circulatory and respiratory systems: Secondary | ICD-10-CM

## 2015-08-02 DIAGNOSIS — IMO0001 Reserved for inherently not codable concepts without codable children: Secondary | ICD-10-CM

## 2015-08-02 DIAGNOSIS — I10 Essential (primary) hypertension: Secondary | ICD-10-CM

## 2015-08-02 LAB — POCT URINALYSIS DIP (MANUAL ENTRY)
Bilirubin, UA: NEGATIVE
Blood, UA: NEGATIVE
Glucose, UA: NEGATIVE
Ketones, POC UA: NEGATIVE
Nitrite, UA: NEGATIVE
PROTEIN UA: NEGATIVE
Spec Grav, UA: 1.025
UROBILINOGEN UA: 0.2
pH, UA: 5

## 2015-08-02 MED ORDER — HYDROCHLOROTHIAZIDE 25 MG PO TABS: 25.0000 mg | ORAL_TABLET | Freq: Every day | ORAL | Status: DC

## 2015-08-02 NOTE — Patient Instructions (Addendum)
IF you received an x-ray today, you will receive an invoice from [facility]. Please contact [facility] at [phone] with questions or concerns regarding your invoice.   IF you received labwork today, you will receive an invoice from [facility]. Please contact [facility] at [phone] with questions or concerns regarding your invoice.   Our billing staff will not be able to assist you with questions regarding bills from these companies.  You will be contacted with the lab results as soon as they are available. The fastest way to get your results is to activate your My Chart account. Instructions are located on the last page of this paperwork. If you have not heard from [place] regarding the results in 2 weeks, please contact this office.    Hypertension Hypertension, commonly called high blood pressure, is when the force of blood pumping through your arteries is too strong. Your arteries are the blood vessels that carry blood from your heart throughout your body. A blood pressure reading consists of a higher number over a lower number, such as 110/72. The higher number (systolic) is the pressure inside your arteries when your heart pumps. The lower number (diastolic) is the pressure inside your arteries when your heart relaxes. Ideally you want your blood pressure below 120/80. Hypertension forces your heart to work harder to pump blood. Your arteries may become narrow or stiff. Having untreated or uncontrolled hypertension can cause heart attack, stroke, kidney disease, and other problems. RISK FACTORS Some risk factors for high blood pressure are controllable. Others are not.  Risk factors you cannot control include:   Race. You may be at higher risk if you are African American.  Age. Risk increases with age.  Gender. Men are at higher risk than women before age 28 years. After age 76, women are at higher risk than men. Risk factors you can  control include:  Not getting enough exercise or physical activity.  Being overweight.  Getting too much fat, sugar, calories, or salt in your diet.  Drinking too much alcohol. SIGNS AND SYMPTOMS Hypertension does not usually cause signs or symptoms. Extremely high blood pressure (hypertensive crisis) may cause headache, anxiety, shortness of breath, and nosebleed. DIAGNOSIS To check if you have hypertension, your health care provider will measure your blood pressure while you are seated, with your arm held at the level of your heart. It should be measured at least twice using the same arm. Certain conditions can cause a difference in blood pressure between your right and left arms. A blood pressure reading that is higher than normal on one occasion does not mean that you need treatment. If it is not clear whether you have high blood pressure, you may be asked to return on a different day to have your blood pressure checked again. Or, you may be asked to monitor your blood pressure at home for 1 or more weeks. TREATMENT Treating high blood pressure includes making lifestyle changes and possibly taking medicine. Living a healthy lifestyle can help lower high blood pressure. You may need to change some of your habits. Lifestyle changes may include:  Following the DASH diet. This diet is high in fruits, vegetables, and whole grains. It is low in salt, red meat, and added sugars.  Keep your sodium intake below 2,300 mg per day.  Getting at least 30-45 minutes of aerobic exercise at least 4 times per week.  Losing weight if necessary.  Not smoking.  Limiting alcoholic beverages.  Learning ways to reduce stress. Your health care provider may prescribe medicine if lifestyle changes are not enough to get your blood pressure under control, and if one of the following is true:  You are 82-8 years of age and your systolic blood pressure is above 140.  You are 57 years of age or older, and  your systolic blood pressure is above 150.  Your diastolic blood pressure is above 90.  You have diabetes, and your systolic blood pressure is over 403 or your diastolic blood pressure is over 90.  You have kidney disease and your blood pressure is above 140/90.  You have heart disease and your blood pressure is above 140/90. Your personal target blood pressure may vary depending on your medical conditions, your age, and other factors. HOME CARE INSTRUCTIONS  Have your blood pressure rechecked as directed by your health care provider.   Take medicines only as directed by your health care provider. Follow the directions carefully. Blood pressure medicines must be taken as prescribed. The medicine does not work as well when you skip doses. Skipping doses also puts you at risk for problems.  Do not smoke.   Monitor your blood pressure at home as directed by your health care provider. SEEK MEDICAL CARE IF:   You think you are having a reaction to medicines taken.  You have recurrent headaches or feel dizzy.  You have swelling in your ankles.  You have trouble with your vision. SEEK IMMEDIATE MEDICAL CARE IF:  You develop a severe headache or confusion.  You have unusual weakness, numbness, or feel faint.  You have severe chest or abdominal pain.  You vomit repeatedly.  You have trouble breathing. MAKE SURE YOU:   Understand these instructions.  Will watch your condition.  Will get help right away if you are not doing well or get worse.   This information is not intended to replace advice given to you by your health care provider. Make sure you discuss any questions you have with your health care provider.   Document Released: 01/23/2005 Document Revised: 06/09/2014 Document Reviewed: 11/15/2012 Elsevier Interactive Patient Education 2016 [place] Your High Blood Pressure Blood pressure is a measurement of how forceful your blood is pressing against  the walls of the arteries. Arteries are muscular tubes within the circulatory system. Blood pressure does not stay the same. Blood pressure rises when you are active, excited, or nervous; and it lowers during sleep and relaxation. If the numbers measuring your blood pressure stay above normal most of the time, you are at risk for health problems. High blood pressure (hypertension) is a long-term (chronic) condition in which blood pressure is elevated. A blood pressure reading is recorded as two numbers, such as 120 over 80 (or 120/80). The first, higher number is called the systolic pressure. It is a measure of the pressure in your arteries as the heart beats. The second, lower number is called the diastolic pressure. It is a measure of the pressure in your arteries as the heart relaxes between beats.  Keeping your blood pressure in a normal range is important to your overall health and prevention of health problems, such as heart disease and stroke. When your blood pressure is uncontrolled, your heart has to work harder than normal. High blood pressure is a very common condition in adults because blood pressure tends to rise with age. Men and women are equally likely to have hypertension but at different times in life. Before age  70, men are more likely to have hypertension. After 52 years of age, women are more likely to have it. Hypertension is especially common in African Americans. This condition often has no signs or symptoms. The cause of the condition is usually not known. Your caregiver can help you come up with a plan to keep your blood pressure in a normal, healthy range. BLOOD PRESSURE STAGES Blood pressure is classified into four stages: normal, prehypertension, stage 1, and stage 2. Your blood pressure reading will be used to determine what type of treatment, if any, is necessary. Appropriate treatment options are tied to these four stages:  Normal  Systolic pressure (mm Hg): below  120.  Diastolic pressure (mm Hg): below 80. Prehypertension  Systolic pressure (mm Hg): 120 to 139.  Diastolic pressure (mm Hg): 80 to 89. Stage1  Systolic pressure (mm Hg): 140 to 159.  Diastolic pressure (mm Hg): 90 to 99. Stage2  Systolic pressure (mm Hg): 160 or above.  Diastolic pressure (mm Hg): 100 or above. RISKS RELATED TO HIGH BLOOD PRESSURE Managing your blood pressure is an important responsibility. Uncontrolled high blood pressure can lead to:  A heart attack.  A stroke.  A weakened blood vessel (aneurysm).  Heart failure.  Kidney damage.  Eye damage.  Metabolic syndrome.  Memory and concentration problems. HOW TO MANAGE YOUR BLOOD PRESSURE Blood pressure can be managed effectively with lifestyle changes and medicines (if needed). Your caregiver will help you come up with a plan to bring your blood pressure within a normal range. Your plan should include the following: Education  Read all information provided by your caregivers about how to control blood pressure.  Educate yourself on the latest guidelines and treatment recommendations. New research is always being done to further define the risks and treatments for high blood pressure. Lifestylechanges  Control your weight.  Avoid smoking.  Stay physically active.  Reduce the amount of salt in your diet.  Reduce stress.  Control any chronic conditions, such as high cholesterol or diabetes.  Reduce your alcohol intake. Medicines  Several medicines (antihypertensive medicines) are available, if needed, to bring blood pressure within a normal range. Communication  Review all the medicines you take with your caregiver because there may be side effects or interactions.  Talk with your caregiver about your diet, exercise habits, and other lifestyle factors that may be contributing to high blood pressure.  See your caregiver regularly. Your caregiver can help you create and adjust your plan  for managing high blood pressure. RECOMMENDATIONS FOR TREATMENT AND FOLLOW-UP  The following recommendations are based on current guidelines for managing high blood pressure in nonpregnant adults. Use these recommendations to identify the proper follow-up period or treatment option based on your blood pressure reading. You can discuss these options with your caregiver.  Systolic pressure of 431 to 540 or diastolic pressure of 80 to 89: Follow up with your caregiver as directed.  Systolic pressure of 086 to 761 or diastolic pressure of 90 to 100: Follow up with your caregiver within 2 months.  Systolic pressure above 950 or diastolic pressure above 932: Follow up with your caregiver within 1 month.  Systolic pressure above 671 or diastolic pressure above 245: Consider antihypertensive therapy; follow up with your caregiver within 1 week.  Systolic pressure above 809 or diastolic pressure above 983: Begin antihypertensive therapy; follow up with your caregiver within 1 week.   This information is not intended to replace advice given to you by your health care  provider. Make sure you discuss any questions you have with your health care provider.   Document Released: 10/18/2011 Document Reviewed: 10/18/2011 Elsevier Interactive Patient Education [employer].

## 2015-08-02 NOTE — Progress Notes (Signed)
Subjective:  By signing my name below, I, [name], attest that this documentation has been prepared under the direction and in the presence of [name], MD. Electronically Signed: [name], [place]. 08/02/2015 , 7:57 PM .  Patient was seen in Room 14 .   Patient ID: Patricia Pena, female    DOB: 10-15-1965, 50 y.o.   MRN: [id number] Chief Complaint  Patient presents with  . Hypertension    x2 weeks   HPI Patricia Pena is a 50 y.o. female who presents to [facility] complaining of elevated blood pressure over the last 2 weeks. Patient states having a lot of stress with work and personal stress. She works in [employer], quantity at work, and often drink coffee and energy drinks. She has been checking her BP, worst running about 170/110. She also notes being scared because she would check her BP in the morning and her diastolic number would run over 100. She would also occasionally wake up with some chest tightness. Prior to 2 weeks ago, her BP was running about 154/80. She has been waking up with headache intermittently; tried ibuprofen without relief. She denies eye floaters. She denies fatigue during the day, urinary symptoms, bowel symptoms, changes in swelling in her legs, appetite loss, or chest pressure. She denies alcohol use. She denies smoking history. She denies sleep issues as she feels rested when waking up.   She's been trying to fix it herself by watching her sodium. She's stopped drinking energy drinks and would only drink coffee (24oz) in the morning. She's also been cutting back on soda, drinking 2 cans in a week. She's also been taking coenzyme q-10. She's been exercising on the elliptical and doing yard work.   She denies seeing PCP in a while.  She stopped donating blood last week due to elevated BP.  Her mother has afib.   Past Medical History  Diagnosis Date  . Arthritis   . Incisional hernia without mention of obstruction or gangrene   . Seizures ([place])     last one was  in 1985  . Morbid obesity ([place])    Prior to Admission medications   Medication Sig Start Date End Date Taking? Authorizing Provider  CALCIUM-VITAMIN D PO Take 2,000 mg by mouth daily.    Yes Historical Provider, MD  ibuprofen (ADVIL,MOTRIN) 200 MG tablet Take 800 mg by mouth every 6 (six) hours as needed. For pain   Yes Historical Provider, MD   Allergies  Allergen Reactions  . Codeine Other (See Comments)    headache   Depression screen [facility] 2/9 08/02/2015  Decreased Interest 0  Down, Depressed, Hopeless 0  PHQ - 2 Score 0     Review of Systems  Constitutional: Positive for activity change (increase). Negative for fever, chills, fatigue and unexpected weight change.  Eyes: Positive for redness. Negative for visual disturbance.  Respiratory: Positive for chest tightness. Negative for cough, shortness of breath and wheezing.   Cardiovascular: Positive for leg swelling (dependent pedal edema, resolves wiht elevation). Negative for chest pain and palpitations.  Gastrointestinal: Negative for nausea, vomiting, diarrhea and constipation.  Endocrine: Negative for polydipsia, polyphagia and polyuria.  Genitourinary: Negative for dysuria and frequency.  Musculoskeletal: Positive for arthralgias.  Allergic/Immunologic: Negative for immunocompromised state.  Neurological: Positive for headaches. Negative for dizziness, syncope, facial asymmetry and light-headedness.  Psychiatric/Behavioral: Positive for agitation (stressed). Negative for confusion, sleep disturbance and dysphoric mood.       Objective:   Physical Exam  Constitutional:  She is oriented to person, place, and time. She appears well-developed and well-nourished. No distress.  HENT:  Head: Normocephalic and atraumatic.  Eyes: EOM are normal. Pupils are equal, round, and reactive to light.  Neck: Neck supple. No thyromegaly present.  Cardiovascular: Regular rhythm, S1 normal, S2 normal and normal heart sounds.  Tachycardia  present.   No murmur heard. Pulmonary/Chest: Effort normal and breath sounds normal. No respiratory distress.  Musculoskeletal: Normal range of motion.  Lymphadenopathy:    She has no cervical adenopathy.  Neurological: She is alert and oriented to person, place, and time.  Skin: Skin is warm and dry.  Psychiatric: She has a normal mood and affect. Her behavior is normal.  Nursing note and vitals reviewed.   BP 154/96 mmHg  Pulse 98  Temp(Src) 98.2 F (36.8 C) (Oral)  Resp 20  Ht '5\' 3"'$  (1.6 m)  Wt 291 lb (131.997 kg)  BMI 51.56 kg/m2  SpO2 95%   EKG: normal sinus rhythm, some flipped t-waves in v4 otherwise normal She had flipped t-waves throughout all medial chest leads except v6 on EKG from 2013.   Results for orders placed or performed in visit on 08/02/15  POCT urinalysis dipstick  Result Value Ref Range   Color, UA yellow yellow   Clarity, UA clear clear   Glucose, UA negative negative   Bilirubin, UA negative negative   Ketones, POC UA negative negative   Spec Grav, UA 1.025    Blood, UA negative negative   pH, UA 5.0    Protein Ur, POC negative negative   Urobilinogen, UA 0.2    Nitrite, UA Negative Negative   Leukocytes, UA Trace (A) Negative       Assessment & Plan:   1. Elevated blood pressure   2. Stress at work   3. Tachycardia with hypertension   Pt really wants to control her BP through tlc but agrees to start hctz for now and can come off when BP is at/below goal. She has already started to decrease caffeine and increase exercise. Working on [employer]. Lots of stressors. Fatigue, morning HAs, morning elev BP but no other sxs of sleep apnea.  Will cont on high K diet and increase H2O while on hctz. Will cont to monitor BP at home to ensure response, recheck in 4-6 wks with fasting lipid panel and repeat bmp.  Orders Placed This Encounter  Procedures  . Comprehensive metabolic panel  . TSH  . CBC  . POCT urinalysis dipstick  . EKG 12-Lead     Meds ordered this encounter  Medications  . hydrochlorothiazide (HYDRODIURIL) 25 MG tablet    Sig: Take 1 tablet (25 mg total) by mouth daily.    Dispense:  30 tablet    Refill:  3    I personally performed the services described in this documentation, which was scribed in my presence. The recorded information has been reviewed and considered, and addended by me as needed.   [name], M.D.  [facility] [address] [place], [place] [address] [phone] phone [phone] fax  08/02/2015 11:44 PM

## 2015-08-03 LAB — CBC
HCT: 43.7 % (ref 35.0–45.0)
Hemoglobin: 15.1 g/dL (ref 11.7–15.5)
MCH: 32.6 pg (ref 27.0–33.0)
MCHC: 34.6 g/dL (ref 32.0–36.0)
MCV: 94.4 fL (ref 80.0–100.0)
MPV: 10.2 fL (ref 7.5–12.5)
PLATELETS: 262 10*3/uL (ref 140–400)
RBC: 4.63 MIL/uL (ref 3.80–5.10)
RDW: 13 % (ref 11.0–15.0)
WBC: 9 10*3/uL (ref 3.8–10.8)

## 2015-08-03 LAB — COMPREHENSIVE METABOLIC PANEL
ALBUMIN: 4.2 g/dL (ref 3.6–5.1)
ALK PHOS: 57 U/L (ref 33–130)
ALT: 14 U/L (ref 6–29)
AST: 18 U/L (ref 10–35)
BUN: 16 mg/dL (ref 7–25)
CALCIUM: 9.7 mg/dL (ref 8.6–10.4)
CO2: 23 mmol/L (ref 20–31)
Chloride: 103 mmol/L (ref 98–110)
Creat: 0.78 mg/dL (ref 0.50–1.05)
Glucose, Bld: 107 mg/dL — ABNORMAL HIGH (ref 65–99)
POTASSIUM: 4.3 mmol/L (ref 3.5–5.3)
Sodium: 139 mmol/L (ref 135–146)
TOTAL PROTEIN: 7.4 g/dL (ref 6.1–8.1)
Total Bilirubin: 0.5 mg/dL (ref 0.2–1.2)

## 2015-08-03 LAB — TSH: TSH: 1.17 m[IU]/L

## 2015-08-14 ENCOUNTER — Telehealth: Payer: Self-pay

## 2015-08-14 NOTE — Telephone Encounter (Signed)
Patient is concerned about her blood pressure not going down. She stated she is taking a water pill and she think it isn't strong enough. Patient stated the medication doesn't kick in until 12 midnight. [phone]

## 2015-08-16 ENCOUNTER — Encounter: Payer: Self-pay | Admitting: Family Medicine

## 2015-08-16 MED ORDER — LISINOPRIL-HYDROCHLOROTHIAZIDE 20-25 MG PO TABS
1.0000 | ORAL_TABLET | Freq: Every day | ORAL | Status: DC
Start: 2015-08-16 — End: 2015-09-25

## 2015-08-16 NOTE — Telephone Encounter (Signed)
Attempted to call pt, left VM for pt to call back asap  

## 2015-08-16 NOTE — Telephone Encounter (Signed)
What is her blood pressure?? If it is still over >140/90 all of the time, she can switch to lisinopril-hctz which I sent in to her pharmacy. This medicine normally is much more effective in lowering blood pressure and should protect the kidneys but in some rare conditions could cause kidney irritation so she need to come back into clinic in 1-2 wks after starting the new medicine to recheck FASTING labs and review her home BP. She should bring her home cuff in with her for review and check accuracy.

## 2015-08-16 NOTE — Telephone Encounter (Signed)
Expand All Collapse All     Subjective:  By signing my name below, I, Patricia Pena, attest that this documentation has been prepared under the direction and in the presence of [name], MD. Electronically Signed: Moises Pena, [place]. 08/02/2015 , 7:57 PM .  Patient was seen in Room 14 .   Patient ID: Patricia Pena, female DOB: [birth year], 50 y.o. MRN: [id number] Chief Complaint  Patient presents with  . Hypertension    x2 weeks   HPI Patricia Pena is a 50 y.o. female who presents to [facility] complaining of elevated Pena pressure over the last 2 weeks. Patient states having a lot of stress with work and personal stress. She works in [employer], quantity at work, and often drink coffee and energy drinks. She has been checking her BP, worst running about 170/110. She also notes being scared because she would check her BP in the morning and her diastolic number would run over 100. She would also occasionally wake up with some chest tightness. Prior to 2 weeks ago, her BP was running about 154/80. She has been waking up with headache intermittently; tried ibuprofen without relief. She denies eye floaters. She denies fatigue during the day, urinary symptoms, bowel symptoms, changes in swelling in her legs, appetite loss, or chest pressure. She denies alcohol use. She denies smoking history. She denies sleep issues as she feels rested when waking up.   She's been trying to fix it herself by watching her sodium. She's stopped drinking energy drinks and would only drink coffee (24oz) in the morning. She's also been cutting back on soda, drinking 2 cans in a week. She's also been taking coenzyme q-10. She's been exercising on the elliptical and doing yard work.   She denies seeing PCP in a while.  She stopped donating Pena last week due to elevated BP.  Her mother has afib.   Past Medical History  Diagnosis Date  . Arthritis   . Incisional hernia without mention of obstruction  or gangrene   . Seizures ([place])     last one was in 1985  . Morbid obesity ([place])    Prior to Admission medications   Medication Sig Start Date End Date Taking? Authorizing Provider  CALCIUM-VITAMIN D PO Take 2,000 mg by mouth daily.    Yes Historical Provider, MD  ibuprofen (ADVIL,MOTRIN) 200 MG tablet Take 800 mg by mouth every 6 (six) hours as needed. For pain   Yes Historical Provider, MD   Allergies  Allergen Reactions  . Codeine Other (See Comments)    headache   Depression screen [facility] 2/9 08/02/2015  Decreased Interest 0  Down, Depressed, Hopeless 0  PHQ - 2 Score 0     Review of Systems  Constitutional: Positive for activity change (increase). Negative for fever, chills, fatigue and unexpected weight change.  Eyes: Positive for redness. Negative for visual disturbance.  Respiratory: Positive for chest tightness. Negative for cough, shortness of breath and wheezing.  Cardiovascular: Positive for leg swelling (dependent pedal edema, resolves wiht elevation). Negative for chest pain and palpitations.  Gastrointestinal: Negative for nausea, vomiting, diarrhea and constipation.  Endocrine: Negative for polydipsia, polyphagia and polyuria.  Genitourinary: Negative for dysuria and frequency.  Musculoskeletal: Positive for arthralgias.  Allergic/Immunologic: Negative for immunocompromised state.  Neurological: Positive for headaches. Negative for dizziness, syncope, facial asymmetry and light-headedness.  Psychiatric/Behavioral: Positive for agitation (stressed). Negative for confusion, sleep disturbance and dysphoric mood.       Objective:   Physical Exam  Constitutional: She is oriented to person, place, and time. She appears well-developed and well-nourished. No distress.  HENT:  Head: Normocephalic and atraumatic.  Eyes: EOM are normal. Pupils are equal, round, and reactive to light.  Neck: Neck supple. No thyromegaly  present.  Cardiovascular: Regular rhythm, S1 normal, S2 normal and normal heart sounds. Tachycardia present.  No murmur heard. Pulmonary/Chest: Effort normal and breath sounds normal. No respiratory distress.  Musculoskeletal: Normal range of motion.  Lymphadenopathy:   She has no cervical adenopathy.  Neurological: She is alert and oriented to person, place, and time.  Skin: Skin is warm and dry.  Psychiatric: She has a normal mood and affect. Her behavior is normal.  Nursing note and vitals reviewed.   BP 154/96 mmHg  Pulse 98  Temp(Src) 98.2 F (36.8 C) (Oral)  Resp 20  Ht '5\' 3"'$  (1.6 m)  Wt 291 lb (131.997 kg)  BMI 51.56 kg/m2  SpO2 95%   EKG: normal sinus rhythm, some flipped t-waves in v4 otherwise normal She had flipped t-waves throughout all medial chest leads except v6 on EKG from 2013.   Results for orders placed or performed in visit on 08/02/15  POCT urinalysis dipstick  Result Value Ref Range   Color, UA yellow yellow   Clarity, UA clear clear   Glucose, UA negative negative   Bilirubin, UA negative negative   Ketones, POC UA negative negative   Spec Grav, UA 1.025    Pena, UA negative negative   pH, UA 5.0    Protein Ur, POC negative negative   Urobilinogen, UA 0.2    Nitrite, UA Negative Negative   Leukocytes, UA Trace (A) Negative       Assessment & Plan:   1. Elevated Pena pressure   2. Stress at work   3. Tachycardia with hypertension   Pt really wants to control her BP through tlc but agrees to start hctz for now and can come off when BP is at/below goal. She has already started to decrease caffeine and increase exercise. Working on [employer]. Lots of stressors. Fatigue, morning HAs, morning elev BP but no other sxs of sleep apnea. Will cont on high K diet and increase H2O while on hctz. Will cont to monitor BP at home to ensure response, recheck in 4-6 wks with fasting lipid  panel and repeat bmp.       Please advise.

## 2015-08-20 ENCOUNTER — Telehealth: Payer: Self-pay

## 2015-08-20 NOTE — Telephone Encounter (Signed)
Dr. Brigitte Pulse   Pt. Wanted to know should she still be taking the HCTZ since her labs came back normal especially the salt levels in her blood, she did say her blood pressure has been running in the 140s and 80s and would prefer to not be in the HCTZ at all and wanted to know if she can stop it since all her labs came back normal. She said at her job that they will be doing annual health and she will get her blood work rechecked during that time.

## 2015-08-21 NOTE — Telephone Encounter (Signed)
See phone call note about her BP from 7/10 when she was calling in because she thought the hctz wasn't working and her BP was still to high. It looks like we hadn't been able to pass that message on to her yet.  Yes, she still needs to take the hctz - the fact that the salts in her blood are normal tells [place] that her kidneys are doing a their job and helps reassure [place] that IT IS SAFE to use the hctz and that there is less likely to have a serious underlying medical condition with her pituitary gland, adrenals, or kidneys.   A BP of 140/80 is still to high and would recommend to add on an additional blood pressure medication WITH the hctz to get the BP down to 120s/70s.  With BP the lower the better - to low is when you feel dizzy or lightheaded with a racing heart.  These cut-offs to be <140 on the top number and <90 on the bottom that they do at job fairs and annual health screenings are an artificial construct we use to help triage groups of people but when you have no other medical problems, making sure you control your BP with pills until you change your lifestyle enough with diet, exercise, lots of water and protein, no salt, and weightloss and then gradually come off your BP medicine will ensure you are less likely to sustain damage to your brain, eyes, heart, kidneys, etc in the interim.  See other message for change in BP med.  Check BP after changing med. If new med is to strong for her, she may be able to cut it in half.  If she has her BP and basic metabolic panel checked in 2 wks after starting the new med at work and drop off a copy, I may be willing to refill med without a return office visit depending on the results.

## 2015-08-24 NOTE — Telephone Encounter (Signed)
Pt advised.

## 2015-08-24 NOTE — Telephone Encounter (Signed)
Pt would like to hold off on the blood pressure medication because her BP has been in the 130's with just the HCTZ. She will call us with bp readings for the next week to see how it's going. She will get the new medication but will hold off until she updates me next week.

## 2015-08-25 NOTE — Telephone Encounter (Signed)
Sounds good thanks

## 2015-09-25 ENCOUNTER — Telehealth: Payer: Self-pay | Admitting: Family Medicine

## 2015-09-25 MED ORDER — HYDROCHLOROTHIAZIDE 25 MG PO TABS: 25.0000 mg | ORAL_TABLET | Freq: Every day | ORAL | 3 refills | Status: DC

## 2015-09-25 NOTE — Telephone Encounter (Signed)
Received fasting labs from pt's work. Nml cmp, gluc was 114 a1c 5.7 nml tsh, nml cbc, nml uric acid, nml ldh, nml iron Lipids excellent - ldl 89, hdl 54, trig 77 total 158  Ok for pt to stay on hctz 25 qd.

## 2015-09-27 NOTE — Telephone Encounter (Signed)
Advised lady to have pt return my call.

## 2015-09-27 NOTE — Telephone Encounter (Signed)
Pt advised.

## 2015-10-18 ENCOUNTER — Telehealth: Payer: Self-pay

## 2015-10-18 NOTE — Telephone Encounter (Signed)
Msg is for Dr. Brigitte Pulse, pt wants to know if she can be called in estrogen pills.  Please advise  [phone]

## 2015-10-19 NOTE — Telephone Encounter (Signed)
Absolutely not, there can be significant cancer and cardiovascular risk with these so def requires an OV to see if pt is a candidate and require reg in office monitoring if started.

## 2015-10-19 NOTE — Telephone Encounter (Signed)
Spoke with pts mother and asked her to tell pt to call us back asap

## 2015-10-21 NOTE — Telephone Encounter (Signed)
Pt informed me she has made an appt to address this issue. She states appt is for tomorrow at 230

## 2015-10-22 ENCOUNTER — Ambulatory Visit (INDEPENDENT_AMBULATORY_CARE_PROVIDER_SITE_OTHER): Payer: BLUE CROSS/BLUE SHIELD | Admitting: Family Medicine

## 2015-10-22 VITALS — BP 132/80 | HR 92 | Temp 97.9°F | Resp 17 | Ht 62.5 in | Wt 276.0 lb

## 2015-10-22 DIAGNOSIS — R030 Elevated blood-pressure reading, without diagnosis of hypertension: Secondary | ICD-10-CM | POA: Diagnosis not present

## 2015-10-22 DIAGNOSIS — G478 Other sleep disorders: Secondary | ICD-10-CM

## 2015-10-22 DIAGNOSIS — N951 Menopausal and female climacteric states: Secondary | ICD-10-CM

## 2015-10-22 DIAGNOSIS — N809 Endometriosis, unspecified: Secondary | ICD-10-CM | POA: Diagnosis not present

## 2015-10-22 DIAGNOSIS — IMO0001 Reserved for inherently not codable concepts without codable children: Secondary | ICD-10-CM

## 2015-10-22 NOTE — Patient Instructions (Signed)
     IF you received an x-ray today, you will receive an invoice from [facility]. Please contact [facility] at [phone] with questions or concerns regarding your invoice.   IF you received labwork today, you will receive an invoice from [facility]. Please contact [facility] at [phone] with questions or concerns regarding your invoice.   Our billing staff will not be able to assist you with questions regarding bills from these companies.  You will be contacted with the lab results as soon as they are available. The fastest way to get your results is to activate your My Chart account. Instructions are located on the last page of this paperwork. If you have not heard from us regarding the results in 2 weeks, please contact this office.      

## 2015-10-22 NOTE — Progress Notes (Signed)
Subjective:  By signing my name below, I, Patricia Pena, attest that this documentation has been prepared under the direction and in the presence of [name], MD. Electronically Signed: Moises Pena, [place]. 10/22/2015 , 4:11 PM .  Patient was seen in Room 14 .   Patient ID: Patricia Pena, female    DOB: [birth year], 50 y.o.   MRN: [id number] Chief Complaint  Patient presents with  . other    patient is requesting estrogen    HPI Patricia Pena is a 50 y.o. female who presents to [facility] who is requesting starting estrogen. Patient wants to start this because she noticed her Pena pressure has been rising. She's also noticed night sweats that started a few years ago. She's had history of hysterectomy and partial oophorectomy due to fibroids 4 years ago. She denies family history of uterine or breast cancer. She denies family history of heart disease in the women; though, her mother was recently diagnosed a fib.   Patient states she's not taking BP medication because she's managing her Pena pressure by not eating white foods. She declines offer to see cardiology or gynecologist for additional BP management.   Past Medical History:  Diagnosis Date  . Arthritis   . Incisional hernia without mention of obstruction or gangrene   . Morbid obesity ([place])   . Seizures ([place])    last one was in 1985   Prior to Admission medications   Medication Sig Start Date End Date Taking? Authorizing Provider  CALCIUM-VITAMIN D PO Take 2,000 mg by mouth daily.    Yes Historical Provider, MD  ibuprofen (ADVIL,MOTRIN) 200 MG tablet Take 800 mg by mouth every 6 (six) hours as needed. For pain   Yes Historical Provider, MD  hydrochlorothiazide (HYDRODIURIL) 25 MG tablet Take 1 tablet (25 mg total) by mouth daily. Patient not taking: Reported on 10/22/2015 09/25/15   Shawnee Knapp, MD   Allergies  Allergen Reactions  . Codeine Other (See Comments)    headache   Review of Systems  Constitutional: Negative  for fatigue and unexpected weight change.  Respiratory: Negative for chest tightness and shortness of breath.   Cardiovascular: Negative for chest pain, palpitations and leg swelling.  Gastrointestinal: Negative for abdominal pain and Pena in stool.  Neurological: Negative for dizziness, syncope, light-headedness and headaches.       Objective:   Physical Exam  Constitutional: She is oriented to person, place, and time. She appears well-developed and well-nourished. No distress.  HENT:  Head: Normocephalic and atraumatic.  Eyes: EOM are normal. Pupils are equal, round, and reactive to light.  Neck: Neck supple.  Cardiovascular: Normal rate.   Pulmonary/Chest: Effort normal. No respiratory distress.  Musculoskeletal: Normal range of motion.  Neurological: She is alert and oriented to person, place, and time.  Skin: Skin is warm and dry.  Psychiatric: She has a normal mood and affect. Her behavior is normal.  Nursing note and vitals reviewed.   BP 132/80 (BP Location: Right Arm, Patient Position: Sitting, Cuff Size: Normal)   Pulse 92   Temp 97.9 F (36.6 C) (Oral)   Resp 17   Ht 5' 2.5" (1.588 m)   Wt 276 lb (125.2 kg)   SpO2 94%   BMI 49.68 kg/m     Assessment & Plan:   1. Perimenopausal vasomotor symptoms   2. Elevated Pena pressure   3. Endometriosis   4. Poor sleep pattern    I do not feel comfortable starting HRT  on pt as she is at high risk with h/o elevated Pena pressure, poor medical compliance/f/u in the past, and the fact that she has been post-menopausal for the past 3-4 yrs so restarting now increases MI/CVD risk. Pt's main reason for wanting to restart HRT is that she is convinced that this would lower her BP as she did not have elevated BP prior to her hysterectomy & oophorectomy. No sig vasomotor sxs. Explained to pt that BP often does increase with age and even if HRT did improve her #s of which I am doubtful, she would still be at increased risk of  cardiac complications due to the hormones themselves.  Offered pt referral to gyn and/or cards to discuss further but she declines.   I personally performed the services described in this documentation, which was scribed in my presence. The recorded information has been reviewed and considered, and addended by me as needed.   [name], M.D.  [facility] [address] [place], [place] [address] [phone] phone [phone] fax  11/07/15 12:07 AM

## 2015-12-01 ENCOUNTER — Encounter (HOSPITAL_COMMUNITY): Payer: Self-pay | Admitting: *Deleted

## 2015-12-01 ENCOUNTER — Ambulatory Visit (HOSPITAL_COMMUNITY)
Admission: EM | Admit: 2015-12-01 | Discharge: 2015-12-01 | Disposition: A | Discharge status: Home / Self Care | Payer: BLUE CROSS/BLUE SHIELD

## 2015-12-01 DIAGNOSIS — M791 Myalgia, unspecified site: Secondary | ICD-10-CM

## 2015-12-01 DIAGNOSIS — R69 Illness, unspecified: Secondary | ICD-10-CM

## 2015-12-01 DIAGNOSIS — J111 Influenza due to unidentified influenza virus with other respiratory manifestations: Secondary | ICD-10-CM

## 2015-12-01 NOTE — ED Triage Notes (Signed)
Generalised     Body      Aches   Cough    And    Congested        Sinus  Drainage         with   Symptoms  For a  Few    Days        Pt  Has  Been  Taking  otc  Motrin  For  The  Symptoms   She  Is  Masked  And  Is  In a  Private  Room

## 2015-12-01 NOTE — ED Provider Notes (Signed)
CSN: [id number]     Arrival date & time 12/01/15  1010 History   First MD Initiated Contact with Patient 12/01/15 1106     Chief Complaint  Patient presents with  . Generalized Body Aches   (Consider location/radiation/quality/duration/timing/severity/associated sxs/prior Treatment) HPI Pt presents with sore throat, body aches, fever, chills for 5 days Home treatment has been OTC meds without much relief of symptoms Fever is improved for short periods of time with OTC antipyretics. Pain score is 4 mostly from coughing and body aches Taking fluids, no appetite No flu shot Has been exposed to others with similar symptoms.  Denies: CP, SOB, vomiting or diarrhea.  Past Medical History:  Diagnosis Date  . Arthritis   . Incisional hernia without mention of obstruction or gangrene   . Morbid obesity ([place])   . Seizures ([place])    last one was in 1985   Past Surgical History:  Procedure Laterality Date  . ABDOMINAL HYSTERECTOMY  2010   at [facility]  . BREAST CYST EXCISION  1994   right breast  . GALLBLADDER SURGERY  1990  . LEFT HEART CATHETERIZATION WITH CORONARY ANGIOGRAM N/A 06/14/2011   Procedure: LEFT HEART CATHETERIZATION WITH CORONARY ANGIOGRAM;  Surgeon: Candee Furbish, MD;  Location: [facility] CATH LAB;  Service: Cardiovascular;  Laterality: N/A;  . VAGINA SURGERY  2007   Family History  Problem Relation Age of Onset  . Cancer Father     lung   Social History  Substance Use Topics  . Smoking status: Former Smoker    Packs/day: 0.25  . Smokeless tobacco: Not on file  . Alcohol use Yes     Comment: occasionally   OB History    No data available     Review of Systems  Denies: HEADACHE, NAUSEA, ABDOMINAL PAIN, CHEST PAIN, CONGESTION, DYSURIA, SHORTNESS OF BREATH Allergies  Codeine  Home Medications   Prior to Admission medications   Medication Sig Start Date End Date Taking? Authorizing Provider  CALCIUM-VITAMIN D PO Take 2,000 mg by mouth daily.     Historical Provider, MD   ibuprofen (ADVIL,MOTRIN) 200 MG tablet Take 800 mg by mouth every 6 (six) hours as needed. For pain    Historical Provider, MD   Meds Ordered and Administered this Visit  Medications - No data to display  BP (!) 158/102 (BP Location: Left Arm)   Pulse 78   Temp 98.6 F (37 C) (Oral)   Resp 18   SpO2 100%  No data found.   Physical Exam NURSES NOTES AND VITAL SIGNS REVIEWED. CONSTITUTIONAL: Well developed, well nourished, no acute distress HEENT: normocephalic, atraumatic EYES: Conjunctiva normal NECK:normal ROM, supple, no adenopathy PULMONARY:No respiratory distress, normal effort ABDOMINAL: Soft, ND, NT BS+, No CVAT MUSCULOSKELETAL: Normal ROM of all extremities,  SKIN: warm and dry without rash PSYCHIATRIC: Mood and affect, behavior are normal  Urgent Care Course   Clinical Course    Procedures (including critical care time)  Labs Review Labs Reviewed - No data to display  Imaging Review No results found.   Visual Acuity Review  Right Eye Distance:   Left Eye Distance:   Bilateral Distance:    Right Eye Near:   Left Eye Near:    Bilateral Near:         MDM   1. Influenza-like illness   2. Myalgia     Patient is reassured that there are no issues that require transfer to higher level of care at this time or additional tests. Patient  is advised to continue home symptomatic treatment. Patient is advised that if there are new or worsening symptoms to attend the emergency department, contact primary care provider, or return to UC. Instructions of care provided discharged home in stable condition.    THIS NOTE WAS GENERATED USING A VOICE RECOGNITION SOFTWARE PROGRAM. ALL REASONABLE EFFORTS  WERE MADE TO PROOFREAD THIS DOCUMENT FOR ACCURACY.  I have verbally reviewed the discharge instructions with the patient. A printed AVS was given to the patient.  All questions were answered prior to discharge.      Konrad Felix, [place] 12/01/15 1124

## 2015-12-01 NOTE — Discharge Instructions (Signed)
PUSH FLUIDS  MOTRIN FOR BODY ACHES  REST

## 2015-12-29 ENCOUNTER — Ambulatory Visit (INDEPENDENT_AMBULATORY_CARE_PROVIDER_SITE_OTHER): Payer: BLUE CROSS/BLUE SHIELD | Admitting: Family Medicine

## 2015-12-29 VITALS — BP 130/80 | HR 93 | Temp 98.4°F | Resp 17 | Ht 62.5 in | Wt 272.0 lb

## 2015-12-29 DIAGNOSIS — I10 Essential (primary) hypertension: Secondary | ICD-10-CM | POA: Insufficient documentation

## 2015-12-29 MED ORDER — HYDROCHLOROTHIAZIDE 25 MG PO TABS: 12.5000 mg | ORAL_TABLET | Freq: Every day | ORAL | 0 refills | Status: DC

## 2015-12-29 NOTE — Progress Notes (Signed)
   Subjective: CC: BP issue KDX:IPJASN Patricia Pena is a 50 y.o. female presenting to clinic today for same day appointment. PCP: [name], MD Concerns today include:  Elevated blood pressure Patient reports that she has never been formally diagnosed with HTN.  She notes that she was prescribed medication at one point but never started the medicine.  She notes that she has been initiating lifestyle changes which has helped some but she knows that she is not consistently at goal.  She reports a maternal family history of a-fib and heart failure.  She denies family history of stroke, heart attack, hypertension.  Blood pressure at home: 128-160/85-95. Blood pressure today: 130/80. ROS: Denies headache, visual changes, nausea, vomiting, chest pain, abdominal pain or shortness of breath.  She notes occ lightheadness when she has not eaten for a prolonged period of time. She reports she does not watch her salt intake.  Additionally, she donates plasma and was told that her BP needed to be controlled in order to continue donating.  Social History Reviewed: former smoker. FamHx and MedHx reviewed.  Please see EMR.  ROS: Per HPI  Objective: Office vital signs reviewed. BP 130/80 (BP Location: Right Arm, Patient Position: Sitting, Cuff Size: Normal)   Pulse 93   Temp 98.4 F (36.9 C) (Oral)   Resp 17   Ht 5' 2.5" (1.588 m)   Wt 272 lb (123.4 kg)   SpO2 94%   BMI 48.96 kg/m   Physical Examination:  General: Awake, alert, obese, No acute distress Cardio: regular rate and rhythm, S1S2 heard, no murmurs appreciated Pulm: clear to auscultation bilaterally, no wheezes, rhonchi or rales, normal WOB on room air Extremities: warm, well perfused, No edema, cyanosis or clubbing; +2 pulses bilaterally MSK: Normal gait and station  Assessment/ Plan: 50 y.o. female   1. Essential hypertension.  Patient with elevated BP on 2 separate occasions.  BP goal for age is <140/90.  07/2015 BMP reviewed.  Normal  renal function and electrolytes.  Currently, normotensive so will start on low dose thiazide and titrate up if needed.2 - Continue weight loss and diet modification - hydrochlorothiazide (HYDRODIURIL) 25 MG tablet; Take 0.5 tablets (12.5 mg total) by mouth daily.  Dispense: 45 tablet; Refill: 0 - Return precautions reviewed - Follow up in 1 week for BP check  2. Morbid obesity ([place]) - Weight loss/ lifestyle modifications discussed  Janora Norlander, DO PGY-3, [facility]

## 2015-12-29 NOTE — Patient Instructions (Addendum)
I have resent the prescription for the blood pressure pill that was started over the summer.  TAKE 1/2 TABLET (12.'5mg'$ ) DAILY.  A new prescription has been sent to your pharmacy.  Keep a blood pressure log at home.  Take your blood pressure at the same time each day.  Follow up in 1 week for blood pressure check.  Your goal BP is less than 140/90.   Hypertension Hypertension, commonly called high blood pressure, is when the force of blood pumping through your arteries is too strong. Your arteries are the blood vessels that carry blood from your heart throughout your body. A blood pressure reading consists of a higher number over a lower number, such as 110/72. The higher number (systolic) is the pressure inside your arteries when your heart pumps. The lower number (diastolic) is the pressure inside your arteries when your heart relaxes. Ideally you want your blood pressure below 120/80. Hypertension forces your heart to work harder to pump blood. Your arteries may become narrow or stiff. Having untreated or uncontrolled hypertension can cause heart attack, stroke, kidney disease, and other problems. What increases the risk? Some risk factors for high blood pressure are controllable. Others are not. Risk factors you cannot control include:  Race. You may be at higher risk if you are African American.  Age. Risk increases with age.  Gender. Men are at higher risk than women before age 42 years. After age 74, women are at higher risk than men. Risk factors you can control include:  Not getting enough exercise or physical activity.  Being overweight.  Getting too much fat, sugar, calories, or salt in your diet.  Drinking too much alcohol. What are the signs or symptoms? Hypertension does not usually cause signs or symptoms. Extremely high blood pressure (hypertensive crisis) may cause headache, anxiety, shortness of breath, and nosebleed. How is this diagnosed? To check if you have  hypertension, your health care provider will measure your blood pressure while you are seated, with your arm held at the level of your heart. It should be measured at least twice using the same arm. Certain conditions can cause a difference in blood pressure between your right and left arms. A blood pressure reading that is higher than normal on one occasion does not mean that you need treatment. If it is not clear whether you have high blood pressure, you may be asked to return on a different day to have your blood pressure checked again. Or, you may be asked to monitor your blood pressure at home for 1 or more weeks. How is this treated? Treating high blood pressure includes making lifestyle changes and possibly taking medicine. Living a healthy lifestyle can help lower high blood pressure. You may need to change some of your habits. Lifestyle changes may include:  Following the DASH diet. This diet is high in fruits, vegetables, and whole grains. It is low in salt, red meat, and added sugars.  Keep your sodium intake below 2,300 mg per day.  Getting at least 30-45 minutes of aerobic exercise at least 4 times per week.  Losing weight if necessary.  Not smoking.  Limiting alcoholic beverages.  Learning ways to reduce stress. Your health care provider may prescribe medicine if lifestyle changes are not enough to get your blood pressure under control, and if one of the following is true:  You are 26-34 years of age and your systolic blood pressure is above 140.  You are 17 years of age or older,  and your systolic blood pressure is above 150.  Your diastolic blood pressure is above 90.  You have diabetes, and your systolic blood pressure is over 324 or your diastolic blood pressure is over 90.  You have kidney disease and your blood pressure is above 140/90.  You have heart disease and your blood pressure is above 140/90. Your personal target blood pressure may vary depending on your  medical conditions, your age, and other factors. Follow these instructions at home:  Have your blood pressure rechecked as directed by your health care provider.  Take medicines only as directed by your health care provider. Follow the directions carefully. Blood pressure medicines must be taken as prescribed. The medicine does not work as well when you skip doses. Skipping doses also puts you at risk for problems.  Do not smoke.  Monitor your blood pressure at home as directed by your health care provider. Contact a health care provider if:  You think you are having a reaction to medicines taken.  You have recurrent headaches or feel dizzy.  You have swelling in your ankles.  You have trouble with your vision. Get help right away if:  You develop a severe headache or confusion.  You have unusual weakness, numbness, or feel faint.  You have severe chest or abdominal pain.  You vomit repeatedly.  You have trouble breathing. This information is not intended to replace advice given to you by your health care provider. Make sure you discuss any questions you have with your health care provider. Document Released: 01/23/2005 Document Revised: 07/01/2015 Document Reviewed: 11/15/2012 Elsevier Interactive Patient Education  2017 [employer].    IF you received an x-ray today, you will receive an invoice from [facility]. Please contact [facility] at [phone] with questions or concerns regarding your invoice.   IF you received labwork today, you will receive an invoice from [employer]. Please contact [facility] at [phone] with questions or concerns regarding your invoice.   Our billing staff will not be able to assist you with questions regarding bills from these companies.  You will be contacted with the lab results as soon as they are available. The fastest way to get your results is to activate your My Chart account.  Instructions are located on the last page of this paperwork. If you have not heard from [place] regarding the results in 2 weeks, please contact this office.

## 2016-01-05 ENCOUNTER — Ambulatory Visit: Payer: BLUE CROSS/BLUE SHIELD

## 2016-01-06 ENCOUNTER — Ambulatory Visit (INDEPENDENT_AMBULATORY_CARE_PROVIDER_SITE_OTHER): Payer: BLUE CROSS/BLUE SHIELD | Admitting: Family Medicine

## 2016-01-06 VITALS — BP 130/90 | HR 101 | Temp 98.2°F | Resp 18 | Ht 62.5 in | Wt 273.2 lb

## 2016-01-06 DIAGNOSIS — I10 Essential (primary) hypertension: Secondary | ICD-10-CM | POA: Diagnosis not present

## 2016-01-06 MED ORDER — HYDROCHLOROTHIAZIDE 25 MG PO TABS: 25.0000 mg | ORAL_TABLET | Freq: Every day | ORAL | 0 refills | Status: DC

## 2016-01-06 MED ORDER — LISINOPRIL 10 MG PO TABS: 10.0000 mg | ORAL_TABLET | Freq: Every day | ORAL | 0 refills | Status: DC

## 2016-01-06 NOTE — Progress Notes (Signed)
Patient ID: Patricia Pena, female    DOB: 13-Feb-1965, 50 y.o.   MRN: [id number]  PCP: [name], MD  Chief Complaint  Patient presents with  . Blood Pressure Check    Subjective:   HPI 50 year female presents for follow-up hypertension. Pt has been previously seen here at [facility] although she presents newly to me during this visit. Reports that she will be losing her insurance at the end of this month. She is trying to get blood pressure controlled prior to the end of the year. She was previously placed on Lisinopril and reports that she never took medication as she was in denial that she suffered from hypertension. During an office visit on 12/29/1015, she  placed on hydrochlorothiazide which she reports has been unsuccessful in lowering her blood pressure. She reports the following readings: highest 147/96 and the lowest 131/89 out of 7 daily readings. Most of the 7 readings have been greater than 193 systolic and greater than 80 diastolic. She is willing to start on the lisinopril in order to obtain control of blood pressure. Denies chest pain, headaches, or dizziness.  Social History   Social History  . Marital status: Divorced    Spouse name: N/A  . Number of children: N/A  . Years of education: N/A   Occupational History  . Not on file.   Social History Main Topics  . Smoking status: Former Smoker    Packs/day: 1.50    Years: 35.00    Quit date: 12/28/2009  . Smokeless tobacco: Never Used  . Alcohol use Yes     Comment: occasionally  . Drug use: No  . Sexual activity: No   Other Topics Concern  . Not on file   Social History Narrative  . No narrative on file    Family History  Problem Relation Age of Onset  . Cancer Father     lung   Review of Systems See HPI    Patient Active Problem List   Diagnosis Date Noted  . Essential hypertension 12/29/2015  . Arthritis   . Seizures ([place])   . Morbid obesity ([place]) 02/10/2011  . Incisional hernia without  mention of obstruction or gangrene 02/10/2011     Prior to Admission medications   Medication Sig Start Date End Date Taking? Authorizing Provider  OVER THE COUNTER MEDICATION    Yes Historical Provider, MD  CALCIUM-VITAMIN D PO Take 2,000 mg by mouth daily.     Historical Provider, MD  co-enzyme Q-10 50 MG capsule Take 50 mg by mouth daily.    Historical Provider, MD  hydrochlorothiazide (HYDRODIURIL) 25 MG tablet Take 0.5 tablets (12.5 mg total) by mouth daily. 12/29/15   Ashly Windell Moulding, DO  ibuprofen (ADVIL,MOTRIN) 200 MG tablet Take 800 mg by mouth every 6 (six) hours as needed. For pain    Historical Provider, MD     Allergies  Allergen Reactions  . Codeine Other (See Comments)    headache       Objective:  Physical Exam  Constitutional: She is oriented to person, place, and time. She appears well-developed and well-nourished.  HENT:  Head: Normocephalic and atraumatic.  Eyes: Conjunctivae and EOM are normal. Pupils are equal, round, and reactive to light.  Neck: Normal range of motion. Neck supple.  Cardiovascular: Normal rate, regular rhythm, normal heart sounds and intact distal pulses.   No murmur heard. Pulmonary/Chest: Effort normal and breath sounds normal.  Musculoskeletal: Normal range of motion.  Neurological: She is  alert and oriented to person, place, and time.  Skin: Skin is warm and dry.  Psychiatric: She has a normal mood and affect. Her behavior is normal. Judgment and thought content normal.    Vitals:   01/06/16 1442  BP: (!) 148/90  Pulse: (!) 101  Resp: 18  Temp: 98.2 F (36.8 C)   Assessment & Plan:  1. Essential hypertension, uncontrolled  . lisinopril (PRINIVIL,ZESTRIL) 10 MG tablet    Sig: Take 1 tablet (10 mg total) by mouth daily.  . hydrochlorothiazide (HYDRODIURIL) 25 MG tablet    Sig: Take 1 tablet (25 mg total) by mouth daily.   Return the week of December 18th for a hypertension recheck.  Carroll Sage. Kenton Kingfisher, MSN,  FNP-C [facility]

## 2016-01-06 NOTE — Patient Instructions (Addendum)
Increase hydrochlorothiazide 25 mg and resume Lisinopril 10 mg once daily for both medications. Continue to monitor blood pressure daily. Limit foods that are high in sodium and avoid table salt. Return for follow-up the week of December 18.    IF you received an x-ray today, you will receive an invoice from [facility]. Please contact [facility] Radiology at [phone] with questions or concerns regarding your invoice.   IF you received labwork today, you will receive an invoice from [employer]. Please contact [facility] at [phone] with questions or concerns regarding your invoice.   Our billing staff will not be able to assist you with questions regarding bills from these companies.  You will be contacted with the lab results as soon as they are available. The fastest way to get your results is to activate your My Chart account. Instructions are located on the last page of this paperwork. If you have not heard from [place] regarding the results in 2 weeks, please contact this office.     IF you received an x-ray today, you will receive an invoice from [facility]. Please contact [facility] at [phone] with questions or concerns regarding your invoice.   IF you received labwork today, you will receive an invoice from [employer]. Please contact [facility] at [phone] with questions or concerns regarding your invoice.   Our billing staff will not be able to assist you with questions regarding bills from these companies.  You will be contacted with the lab results as soon as they are available. The fastest way to get your results is to activate your My Chart account. Instructions are located on the last page of this paperwork. If you have not heard from [place] regarding the results in 2 weeks, please contact this office.    Hypertension Hypertension is another name for high blood pressure. High blood pressure  forces your heart to work harder to pump blood. A blood pressure reading has two numbers, which includes a higher number over a lower number (example: 110/72). Follow these instructions at home:  Have your blood pressure rechecked by your doctor.  Only take medicine as told by your doctor. Follow the directions carefully. The medicine does not work as well if you skip doses. Skipping doses also puts you at risk for problems.  Do not smoke.  Monitor your blood pressure at home as told by your doctor. Contact a doctor if:  You think you are having a reaction to the medicine you are taking.  You have repeat headaches or feel dizzy.  You have puffiness (swelling) in your ankles.  You have trouble with your vision. Get help right away if:  You get a very bad headache and are confused.  You feel weak, numb, or faint.  You get chest or belly (abdominal) pain.  You throw up (vomit).  You cannot breathe very well. This information is not intended to replace advice given to you by your health care provider. Make sure you discuss any questions you have with your health care provider. Document Released: 07/12/2007 Document Revised: 07/01/2015 Document Reviewed: 11/15/2012 Elsevier Interactive Patient Education  2017 [employer].

## 2016-01-28 ENCOUNTER — Ambulatory Visit (INDEPENDENT_AMBULATORY_CARE_PROVIDER_SITE_OTHER): Payer: BLUE CROSS/BLUE SHIELD | Admitting: Physician Assistant

## 2016-01-28 VITALS — BP 130/86 | HR 83 | Temp 98.6°F | Resp 18 | Ht 62.0 in | Wt 273.8 lb

## 2016-01-28 DIAGNOSIS — Z1159 Encounter for screening for other viral diseases: Secondary | ICD-10-CM | POA: Diagnosis not present

## 2016-01-28 DIAGNOSIS — Z87898 Personal history of other specified conditions: Secondary | ICD-10-CM | POA: Diagnosis not present

## 2016-01-28 DIAGNOSIS — Z114 Encounter for screening for human immunodeficiency virus [HIV]: Secondary | ICD-10-CM | POA: Diagnosis not present

## 2016-01-28 DIAGNOSIS — R7309 Other abnormal glucose: Secondary | ICD-10-CM | POA: Diagnosis not present

## 2016-01-28 DIAGNOSIS — I10 Essential (primary) hypertension: Secondary | ICD-10-CM

## 2016-01-28 MED ORDER — HYDROCHLOROTHIAZIDE 25 MG PO TABS: 25.0000 mg | ORAL_TABLET | Freq: Every day | ORAL | 3 refills | Status: DC

## 2016-01-28 MED ORDER — LISINOPRIL 10 MG PO TABS: 10.0000 mg | ORAL_TABLET | Freq: Every day | ORAL | 4 refills | Status: DC

## 2016-01-28 NOTE — Patient Instructions (Addendum)
  Eat healthy - try to drink more of your water earlier in the day to prevent urination at night.   IF you received an x-ray today, you will receive an invoice from [facility] Radiology. Please contact [facility] Radiology at [phone] with questions or concerns regarding your invoice.   IF you received labwork today, you will receive an invoice from [facility]. Please contact [facility] at [phone] with questions or concerns regarding your invoice.   Our billing staff will not be able to assist you with questions regarding bills from these companies.  You will be contacted with the lab results as soon as they are available. The fastest way to get your results is to activate your My Chart account. Instructions are located on the last page of this paperwork. If you have not heard from [place] regarding the results in 2 weeks, please contact this office.

## 2016-01-28 NOTE — Progress Notes (Signed)
   Patricia Pena  MRN: [id number] DOB: 12-17-1965  Subjective:  Pt presents to clinic for medication refill.  She has been doing good.  She checks her BP weekly when she donated plasma and since she has started medication to has been normal per the plasma clinic.    Review of Systems  Constitutional: Negative for chills and fever.  Respiratory: Negative for shortness of breath.   Cardiovascular: Negative for chest pain, palpitations and leg swelling.  Neurological: Negative for headaches.    Patient Active Problem List   Diagnosis Date Noted  . Essential hypertension 12/29/2015  . Arthritis   . History of seizures   . Morbid obesity ([place]) 02/10/2011  . Incisional hernia without mention of obstruction or gangrene 02/10/2011    Current Outpatient Prescriptions on File Prior to Visit  Medication Sig Dispense Refill  . CALCIUM-VITAMIN D PO Take 2,000 mg by mouth daily.     Marland Kitchen co-enzyme Q-10 50 MG capsule Take 50 mg by mouth daily.    Marland Kitchen ibuprofen (ADVIL,MOTRIN) 200 MG tablet Take 800 mg by mouth every 6 (six) hours as needed. For pain    . OVER THE COUNTER MEDICATION      No current facility-administered medications on file prior to visit.     Allergies  Allergen Reactions  . Codeine Other (See Comments)    headache    Pt patients past, family and social history were reviewed and updated.   Objective:  BP 130/86 (BP Location: Right Arm, Patient Position: Sitting, Cuff Size: Large)   Pulse 83   Temp 98.6 F (37 C) (Oral)   Resp 18   Ht '5\' 2"'$  (1.575 m)   Wt 273 lb 12.8 oz (124.2 kg)   SpO2 97%   BMI 50.08 kg/m   Physical Exam  Constitutional: She is oriented to person, place, and time and well-developed, well-nourished, and in no distress.  HENT:  Head: Normocephalic and atraumatic.  Right Ear: Hearing and external ear normal.  Left Ear: Hearing and external ear normal.  Eyes: Conjunctivae are normal.  Neck: Normal range of motion.  Cardiovascular: Normal rate,  regular rhythm and normal heart sounds.   No murmur heard. Pulmonary/Chest: Effort normal and breath sounds normal.  Musculoskeletal:       Right lower leg: She exhibits no edema.       Left lower leg: She exhibits no edema.  Neurological: She is alert and oriented to person, place, and time. Gait normal.  Skin: Skin is warm and dry.  Psychiatric: Mood, memory, affect and judgment normal.  Vitals reviewed.   Assessment and Plan :  Elevated glucose - Plan: Hemoglobin A1c  History of seizures  Essential hypertension - Plan: CMP14+EGFR, hydrochlorothiazide (HYDRODIURIL) 25 MG tablet, lisinopril (PRINIVIL,ZESTRIL) 10 MG tablet- continue current medication  Encounter for screening for HIV - Plan: HIV antibody  Need for hepatitis C screening test - Plan: HCV Antibody RFX to Quant PCR  Patricia Hummingbird PA-C  [facility] 01/28/2016 4:19 PM

## 2016-01-29 LAB — CMP14+EGFR
ALBUMIN: 4.2 g/dL (ref 3.5–5.5)
ALK PHOS: 53 IU/L (ref 39–117)
ALT: 13 IU/L (ref 0–32)
AST: 16 IU/L (ref 0–40)
Albumin/Globulin Ratio: 1.6 (ref 1.2–2.2)
BUN / CREAT RATIO: 20 (ref 9–23)
BUN: 15 mg/dL (ref 6–24)
CO2: 29 mmol/L (ref 18–29)
CREATININE: 0.75 mg/dL (ref 0.57–1.00)
Calcium: 9.6 mg/dL (ref 8.7–10.2)
Chloride: 101 mmol/L (ref 96–106)
GFR calc Af Amer: 106 mL/min/{1.73_m2} (ref >59)
GFR calc non Af Amer: 92 mL/min/{1.73_m2} (ref >59)
GLOBULIN, TOTAL: 2.7 g/dL (ref 1.5–4.5)
Glucose: 100 mg/dL — ABNORMAL HIGH (ref 65–99)
Potassium: 4.3 mmol/L (ref 3.5–5.2)
SODIUM: 145 mmol/L — AB (ref 134–144)
Total Protein: 6.9 g/dL (ref 6.0–8.5)

## 2016-01-29 LAB — HEMOGLOBIN A1C
Est. average glucose Bld gHb Est-mCnc: 117 mg/dL
Hgb A1c MFr Bld: 5.7 % — ABNORMAL HIGH (ref 4.8–5.6)

## 2016-01-29 LAB — HIV ANTIBODY (ROUTINE TESTING W REFLEX): HIV Screen 4th Generation wRfx: NONREACTIVE

## 2016-01-29 LAB — HCV INTERPRETATION

## 2016-01-29 LAB — HCV AB W REFLEX TO QUANT PCR

## 2016-02-02 ENCOUNTER — Encounter: Payer: Self-pay | Admitting: *Deleted

## 2016-03-24 ENCOUNTER — Other Ambulatory Visit: Payer: Self-pay | Admitting: Family Medicine

## 2016-03-24 DIAGNOSIS — I10 Essential (primary) hypertension: Secondary | ICD-10-CM

## 2016-07-17 ENCOUNTER — Encounter (HOSPITAL_COMMUNITY): Payer: Self-pay | Admitting: Emergency Medicine

## 2016-07-17 ENCOUNTER — Ambulatory Visit (HOSPITAL_COMMUNITY)
Admission: EM | Admit: 2016-07-17 | Discharge: 2016-07-17 | Disposition: A | Discharge status: Home / Self Care | Payer: BLUE CROSS/BLUE SHIELD | Attending: Internal Medicine | Admitting: Internal Medicine

## 2016-07-17 DIAGNOSIS — L259 Unspecified contact dermatitis, unspecified cause: Secondary | ICD-10-CM

## 2016-07-17 DIAGNOSIS — R21 Rash and other nonspecific skin eruption: Secondary | ICD-10-CM | POA: Diagnosis not present

## 2016-07-17 MED ORDER — TRIAMCINOLONE ACETONIDE 0.1 % EX CREA: 1.0000 "application " | TOPICAL_CREAM | Freq: Two times a day (BID) | CUTANEOUS | 0 refills | Status: DC

## 2016-07-17 MED ORDER — PREDNISONE 20 MG PO TABS: ORAL_TABLET | ORAL | 0 refills | Status: DC

## 2016-07-17 NOTE — Discharge Instructions (Signed)
The type rash on your face is consistent with a contact dermatitis but this is not for certain. It may be best to not put any type of makeup or other chemicals on your face except for the prescribed medicine for a while to see if it gradually goes away on its own. Take the prednisone as directed and each time take with food. Make an appointment with your primary care provider for follow-up. It may be wise to obtain some allergy testing.

## 2016-07-17 NOTE — ED Triage Notes (Signed)
Initially eyes itchy, then went to face for 2 months.  Rash on arms one week ago.  Rash seems to improve, then worsens.

## 2016-07-17 NOTE — ED Provider Notes (Signed)
CSN: [id number]     Arrival date & time 07/17/16  1002 History   First MD Initiated Contact with Patient 07/17/16 1118     Chief Complaint  Patient presents with  . Rash   (Consider location/radiation/quality/duration/timing/severity/associated sxs/prior Treatment) 51 year old obese female complaining of a rash to the face for about 2 months. She states it tends to come and go but is there more so than not. It is itchy in thick and red. She states it started in her eyes with redness and itchiness and then developed bilateral erythema to the face. Denies any known application of a chemical or other substances that would cause it. She does use makeup regularly. It is a makeup which she has never been allergic to. For one day she had a couple of allergic spots to both shoulders but that has resolved. Denies generalized rash. Denies problems with breathing, wheezing, cough or edema. The patient believes it may be due to the fact that at one point she ate pineapple and a few minutes later she developed a rash on her face. This was 2 weeks ago. The rash persists as it did before.      Past Medical History:  Diagnosis Date  . Arthritis   . Incisional hernia without mention of obstruction or gangrene   . Morbid obesity ([place])   . Seizures ([place])    last one was in 1985   Past Surgical History:  Procedure Laterality Date  . ABDOMINAL HYSTERECTOMY  2010   at [facility]  . BREAST CYST EXCISION  1994   right breast  . GALLBLADDER SURGERY  1990  . LEFT HEART CATHETERIZATION WITH CORONARY ANGIOGRAM N/A 06/14/2011   Procedure: LEFT HEART CATHETERIZATION WITH CORONARY ANGIOGRAM;  Surgeon: Candee Furbish, MD;  Location: [facility] CATH LAB;  Service: Cardiovascular;  Laterality: N/A;  . VAGINA SURGERY  2007   Family History  Problem Relation Age of Onset  . Cancer Father        lung   Social History  Substance Use Topics  . Smoking status: Former Smoker    Packs/day: 1.50    Years: 35.00    Quit date:  12/28/2009  . Smokeless tobacco: Never Used  . Alcohol use Yes     Comment: occasionally   OB History    No data available     Review of Systems  Constitutional: Negative.   HENT: Negative for congestion, ear pain, mouth sores, rhinorrhea, sneezing, sore throat and trouble swallowing.   Eyes: Positive for redness and itching.  Respiratory: Negative.  Negative for cough, shortness of breath and wheezing.   Gastrointestinal: Negative.   Skin: Positive for color change and rash.  Neurological: Negative.   All other systems reviewed and are negative.   Allergies  Codeine  Home Medications   Prior to Admission medications   Medication Sig Start Date End Date Taking? Authorizing Provider  Cyanocobalamin (VITAMIN B-12 PO) Take by mouth.   Yes [provider]  CALCIUM-VITAMIN D PO Take 2,000 mg by mouth daily.     [provider]  co-enzyme Q-10 50 MG capsule Take 50 mg by mouth daily.    [provider]  hydrochlorothiazide (HYDRODIURIL) 25 MG tablet Take 1 tablet (25 mg total) by mouth daily. 01/28/16   Weber, Damaris Hippo, PA-C  ibuprofen (ADVIL,MOTRIN) 200 MG tablet Take 800 mg by mouth every 6 (six) hours as needed. For pain    [provider]  lisinopril (PRINIVIL,ZESTRIL) 10 MG tablet Take 1 tablet (  10 mg total) by mouth daily. 01/28/16   Weber, Damaris Hippo, PA-C  predniSONE (DELTASONE) 20 MG tablet 3 Tabs PO Days 1-3, then 2 tabs PO Days 4-6, then 1 tab PO Day 7-9, then Half Tab PO Day 10-12 07/17/16   Janne Napoleon, NP  triamcinolone cream (KENALOG) 0.1 % Apply 1 application topically 2 (two) times daily. 07/17/16   Janne Napoleon, NP   Meds Ordered and Administered this Visit  Medications - No data to display  BP (!) 143/89 (BP Location: Left Arm) Comment: large cuff  Pulse 77   Temp 97.7 F (36.5 C) (Oral)   Resp 18   SpO2 97%  No data found.   Physical Exam  Constitutional: She is oriented to person, place, and time. She appears well-developed  and well-nourished. No distress.  HENT:  Head: Normocephalic and atraumatic.  Nose: Nose normal.  Mouth/Throat: Oropharynx is clear and moist. No oropharyngeal exudate.  Eyes: Conjunctivae and EOM are normal. Pupils are equal, round, and reactive to light.  Eyes primarily clear today with minimal erythema to the lower conjunctiva.  Neck: Normal range of motion. Neck supple.  Cardiovascular: Normal rate.   Pulmonary/Chest: Effort normal.  Musculoskeletal: Normal range of motion. She exhibits no edema or deformity.  Neurological: She is alert and oriented to person, place, and time.  Skin: Skin is warm and dry. Capillary refill takes less than 2 seconds.  There is thick raised rough erythematous patches to the bilateral face and portions of the anterior neck. A few satellite papular lesions. No lesions to other body surface areas.  Psychiatric: She has a normal mood and affect. Her behavior is normal.  Nursing note and vitals reviewed.   Urgent Care Course     Procedures (including critical care time)  Labs Review Labs Reviewed - No data to display  Imaging Review No results found.   Visual Acuity Review  Right Eye Distance:   Left Eye Distance:   Bilateral Distance:    Right Eye Near:   Left Eye Near:    Bilateral Near:         MDM   1. Contact dermatitis, unspecified contact dermatitis type, unspecified trigger   2. Rash and nonspecific skin eruption    The type rash on your face is consistent with a contact dermatitis but this is not for certain. It may be best to not put any type of makeup or other chemicals on your face except for the prescribed medicine for a while to see if it gradually goes away on its own. Take the prednisone as directed and each time take with food. Make an appointment with your primary care provider for follow-up. It may be wise to obtain some allergy testing. Meds ordered this encounter  Medications  . Cyanocobalamin (VITAMIN B-12 PO)     Sig: Take by mouth.  . triamcinolone cream (KENALOG) 0.1 %    Sig: Apply 1 application topically 2 (two) times daily.    Dispense:  30 g    Refill:  0    Order Specific Question:   Supervising Provider    Answer:   Sherlene Shams [[id number]]  . predniSONE (DELTASONE) 20 MG tablet    Sig: 3 Tabs PO Days 1-3, then 2 tabs PO Days 4-6, then 1 tab PO Day 7-9, then Half Tab PO Day 10-12    Dispense:  20 tablet    Refill:  0    Order Specific Question:   Supervising Provider  AnswerSherlene Shams [[id number]]       Janne Napoleon, NP 07/17/16 1142

## 2016-09-21 LAB — BASIC METABOLIC PANEL WITH GFR
BUN: 11 (ref 4–21)
Creatinine: 0.8 (ref <=1.1)
Potassium: 4.1 (ref 3.4–5.3)
Sodium: 140 (ref 137–147)

## 2016-09-21 LAB — LIPID PANEL
Cholesterol: 172 (ref 0–200)
HDL: 49 (ref 35–70)
LDL Cholesterol: 111
Triglycerides: 61 (ref 40–160)

## 2016-09-21 LAB — HEPATIC FUNCTION PANEL
ALK PHOS: 45 (ref 25–125)
ALT: 12 (ref 7–35)
AST: 16 (ref 13–35)
BILIRUBIN, TOTAL: 0.2

## 2016-09-21 LAB — CBC AND DIFFERENTIAL
HCT: 39 (ref 36–46)
Hemoglobin: 13.3 (ref 12.0–16.0)
Platelets: 234 (ref 150–399)

## 2016-09-21 LAB — HEMOGLOBIN A1C: HEMOGLOBIN A1C: 5.8

## 2017-02-15 ENCOUNTER — Other Ambulatory Visit: Payer: Self-pay | Admitting: Physician Assistant

## 2017-02-15 DIAGNOSIS — I10 Essential (primary) hypertension: Secondary | ICD-10-CM

## 2017-03-05 ENCOUNTER — Other Ambulatory Visit: Payer: Self-pay

## 2017-03-05 ENCOUNTER — Telehealth: Payer: Self-pay | Admitting: Family Medicine

## 2017-03-05 DIAGNOSIS — I10 Essential (primary) hypertension: Secondary | ICD-10-CM

## 2017-03-05 MED ORDER — HYDROCHLOROTHIAZIDE 25 MG PO TABS: 25.0000 mg | ORAL_TABLET | Freq: Every day | ORAL | 0 refills | Status: DC

## 2017-03-05 NOTE — Telephone Encounter (Signed)
Copied from [place]. Topic: Quick Communication - Rx Refill/Question >> Mar 05, 2017  9:11 AM Synthia Innocent wrote: Medication: lisinopril (PRINIVIL,ZESTRIL) 10 MG tablet and  hydrochlorothiazide (HYDRODIURIL) 25 MG tablet   Has the patient contacted their pharmacy? Yes.     (Agent: If no, request that the patient contact the pharmacy for the refill.)   Preferred Pharmacy (with phone number or street name):[facility]   Agent: Please be advised that RX refills may take up to 3 business days. We ask that you follow-up with your pharmacy.

## 2017-03-21 ENCOUNTER — Other Ambulatory Visit: Payer: Self-pay

## 2017-03-21 ENCOUNTER — Ambulatory Visit: Payer: BLUE CROSS/BLUE SHIELD | Admitting: Physician Assistant

## 2017-03-21 ENCOUNTER — Encounter: Payer: Self-pay | Admitting: Physician Assistant

## 2017-03-21 VITALS — BP 124/85 | HR 110 | Temp 98.3°F | Resp 16 | Ht 62.0 in | Wt 269.8 lb

## 2017-03-21 DIAGNOSIS — R21 Rash and other nonspecific skin eruption: Secondary | ICD-10-CM | POA: Diagnosis not present

## 2017-03-21 DIAGNOSIS — L309 Dermatitis, unspecified: Secondary | ICD-10-CM | POA: Diagnosis not present

## 2017-03-21 MED ORDER — METHYLPREDNISOLONE SODIUM SUCC 125 MG IJ SOLR: 125.0000 mg | Freq: Once | INTRAMUSCULAR | Status: DC

## 2017-03-21 MED ORDER — CRISABOROLE 2 % EX OINT: 1.0000 | TOPICAL_OINTMENT | Freq: Two times a day (BID) | CUTANEOUS | 1 refills | Status: DC

## 2017-03-21 MED ORDER — PREDNISONE 20 MG PO TABS: ORAL_TABLET | ORAL | 0 refills | Status: AC

## 2017-03-21 MED ORDER — METHYLPREDNISOLONE SODIUM SUCC 125 MG IJ SOLR
125.0000 mg | Freq: Once | INTRAMUSCULAR | Status: AC
  Administered 2017-03-21: 125 mg via INTRAMUSCULAR
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
## 2017-03-21 NOTE — Patient Instructions (Addendum)
Apply vaseline to your eczema and face often. Come back in two weeks so we can do your physical.    IF you received an x-ray today, you will receive an invoice from [facility]. Please contact [facility] at [phone] with questions or concerns regarding your invoice.   IF you received labwork today, you will receive an invoice from [facility]. Please contact [facility] at [phone] with questions or concerns regarding your invoice.   Our billing staff will not be able to assist you with questions regarding bills from these companies.  You will be contacted with the lab results as soon as they are available. The fastest way to get your results is to activate your My Chart account. Instructions are located on the last page of this paperwork. If you have not heard from [place] regarding the results in 2 weeks, please contact this office.

## 2017-03-22 LAB — LUPUS ANTICOAGULANT PANEL
Dilute Viper Venom Time: 34.6 s (ref 0.0–47.0)
PTT Lupus Anticoagulant: 31.1 s (ref 0.0–51.9)

## 2017-03-29 ENCOUNTER — Telehealth: Payer: Self-pay | Admitting: Family Medicine

## 2017-03-29 NOTE — Telephone Encounter (Signed)
Left message for pt to call office back. Need to discuss current symptoms an why refill of prednisone is needed. Pt was seen for OV with Waverly Ferrari on 2/13.

## 2017-03-29 NOTE — Telephone Encounter (Signed)
Copied from [place] [id number]. Topic: Quick Communication - Rx Refill/Question >> Mar 29, 2017  3:23 PM Burnis Medin, NT wrote: Medication: predniSONE (DELTASONE) 20 MG tablet   Has the patient contacted their pharmacy? Yes   (Agent: If no, request that the patient contact the pharmacy for the refill.)   Preferred Pharmacy (with phone number or street name[id number] [address], [place] [phone] (Phone) [phone] (Fax)     Agent: Please be advised that RX refills may take up to 3 business days. We ask that you follow-up with your pharmacy.

## 2017-03-29 NOTE — Telephone Encounter (Signed)
Copied from [place] [id number]. Topic: Quick Communication - Lab Results >> Mar 29, 2017  3:21 PM Burnis Medin, NT wrote: Patient called and said she never received her results for her lupus test and would like a call back.

## 2017-03-30 NOTE — Telephone Encounter (Signed)
Patient is calling because today was her last dose of prednisone and her face is acting like it wants to get worse again. She is reporting 75% better- but now she is afraid that her symptoms will return since she has finished the medications. Does provider want to refill the medication - she does have an appointment for her physical on 3/4. She also needs a refill on her BP medication- lisinopril 10 mg. Patient request message on her voice mail- [phone]

## 2017-04-02 ENCOUNTER — Other Ambulatory Visit: Payer: Self-pay | Admitting: Family Medicine

## 2017-04-02 DIAGNOSIS — I10 Essential (primary) hypertension: Secondary | ICD-10-CM

## 2017-04-02 MED ORDER — LISINOPRIL 10 MG PO TABS: 10.0000 mg | ORAL_TABLET | Freq: Every day | ORAL | 0 refills | Status: DC

## 2017-04-02 NOTE — Telephone Encounter (Signed)
Pt given 15 day refill until seen for appt on 04/09/17.

## 2017-04-02 NOTE — Telephone Encounter (Signed)
Copied from [place] #[id number]. Topic: Quick Communication - See Telephone Encounter >> Apr 02, 2017  4:56 PM Vernona Rieger wrote: CRM for notification. See Telephone encounter for:   04/02/17.  Patient has OV next week, she said she really needs her lisinopril (PRINIVIL,ZESTRIL) 10 MG tablet refilled, just enough to get her by until then. Please advise. She said that she already contacted pharmacy about this.   [id number] [address], [place]

## 2017-04-03 ENCOUNTER — Ambulatory Visit: Payer: Self-pay

## 2017-04-03 NOTE — Telephone Encounter (Signed)
Patient called to say "the cream that was prescribed is burning my face and it has been burning since I started it on 03/21/17. Also the prednisone cleared the rash, but as soon as I started tapering the dose, the rash came back and the itching is unbearable. I have an appointment on Monday with Patricia Pena. Do I just suffer through until then?" I advised I could make a late evening appointment, if it is available. She said "let me wait to see what he says about sending in some more prednisone and what to do about this itching and burning." I advised this would be sent to Patricia Pena for reviewing and his recommendation. Someone from the office will notify you with his decision. She verbalized understanding.  Reason for Disposition . Caller has NON-URGENT medication question about med that PCP prescribed and triager unable to answer question  Answer Assessment - Initial Assessment Questions 1. SYMPTOMS: "Do you have any symptoms?"     Yes, burning, itching 2. SEVERITY: If symptoms are present, ask "Are they mild, moderate or severe?"    Severe itching, mild burning  Protocols used: MEDICATION QUESTION CALL-A-AH

## 2017-04-03 NOTE — Telephone Encounter (Signed)
I did not see pt for rash but will defer prednisone extension to [place].  Please let pt know that we will not have time to address her acute facial rash eval this at her CPE considering she has never had a CPE before in [place], and has not been seen for her chronic medical conditions/chronic med refills in >15 mos which I assume she is expecting to have addressed at her wellness exam as well already, and this will be a new problem to me for which she is already partway through work-up/treatment by another provider. Therefore, if her rash worsens, then needs to be seen again in OV in same day appointment to cont the work-up or I can refer her to derm.  15 tabs of lisinopril were appropriately sent in to pt's pharm yest by [facility] RN to cover pt till her appt.

## 2017-04-04 NOTE — Telephone Encounter (Signed)
I tried to call.  Lets try her again this afternoon.  I don't want to just keep her on pred if she is still getting better. Is she using the cream? [name], MS, PA-C 12:38 PM, 04/04/2017

## 2017-04-05 ENCOUNTER — Emergency Department (HOSPITAL_COMMUNITY)
Admission: EM | Admit: 2017-04-05 | Discharge: 2017-04-05 | Disposition: A | Discharge status: Home / Self Care | Payer: BLUE CROSS/BLUE SHIELD | Attending: Emergency Medicine | Admitting: Emergency Medicine

## 2017-04-05 ENCOUNTER — Other Ambulatory Visit: Payer: Self-pay

## 2017-04-05 ENCOUNTER — Ambulatory Visit: Payer: Self-pay | Admitting: *Deleted

## 2017-04-05 ENCOUNTER — Encounter (HOSPITAL_COMMUNITY): Payer: Self-pay | Admitting: Emergency Medicine

## 2017-04-05 DIAGNOSIS — R21 Rash and other nonspecific skin eruption: Secondary | ICD-10-CM | POA: Diagnosis not present

## 2017-04-05 DIAGNOSIS — Z79899 Other long term (current) drug therapy: Secondary | ICD-10-CM | POA: Insufficient documentation

## 2017-04-05 DIAGNOSIS — Z87891 Personal history of nicotine dependence: Secondary | ICD-10-CM | POA: Diagnosis not present

## 2017-04-05 DIAGNOSIS — L509 Urticaria, unspecified: Secondary | ICD-10-CM

## 2017-04-05 DIAGNOSIS — I10 Essential (primary) hypertension: Secondary | ICD-10-CM | POA: Insufficient documentation

## 2017-04-05 MED ORDER — PREDNISONE 10 MG PO TABS: ORAL_TABLET | ORAL | 0 refills | Status: DC

## 2017-04-05 MED ORDER — PREDNISONE 20 MG PO TABS
60.0000 mg | ORAL_TABLET | Freq: Once | ORAL | Status: AC
Start: 2017-04-05 — End: 2017-04-05
  Administered 2017-04-05: 60 mg via ORAL
  Filled 2017-04-05: qty 3

## 2017-04-05 MED ORDER — HYDROXYZINE HCL 25 MG PO TABS: 25.0000 mg | ORAL_TABLET | Freq: Four times a day (QID) | ORAL | 0 refills | Status: DC | PRN

## 2017-04-05 NOTE — ED Triage Notes (Signed)
Patient here with allergic reaction to some unknown product.  Her face is bright red, itchy and swelling noted to eye lids bilaterally.  She denies any shortness of breath or tongue swelling.  She states that this has been going on for over a year.

## 2017-04-05 NOTE — Telephone Encounter (Signed)
Looks like the ED referred her and also placed her on 2 weeks of pred. No further action required at this time.  [name], MS, PA-C 12:02 PM, 04/05/2017

## 2017-04-05 NOTE — Telephone Encounter (Signed)
Pt reports facial rash , intermittent x several months. Worsening 03/21/17, seen by M.Clark PA-C. States prednisone helped some until course completed, "then came back." Some swelling at times around eye area. Denies any swelling of tongue, no difficulty swallowing, no SOB. Could not use cream Rx due to severe burning and itching. Pt presented to ED last night (See photo in chart.) Pt tearful during call, states "I can't get in to see a dermatologist for 2-4 months. I can't live like this." Pt prescribed prednisone and vistaril last night in ED. States "a little relief, not much."  Pt is requesting referral to dermatologist ASAP. Pt aware she may need to be seen by PCP first.  Note routed to practice.  Pt instructed to go to ED for any SOB, tongue, throat swelling, symptoms worsens, rash spreads.   Reason for Disposition . Localized rash present > 7 days  Answer Assessment - Initial Assessment Questions 1. APPEARANCE of RASH: "Describe the rash."     Red, burning. Photo in chart from ED visit last night. 2. LOCATION: "Where is the rash located?"      Face only 3. NUMBER: "How many spots are there?"      Entire face 4. SIZE: "How big are the spots?" (Inches, centimeters or compare to size of a coin)       5. ONSET: "When did the rash start?"      Intermittent several months.  Worsened 03/21/17, seen by M.Clark 6. ITCHING: "Does the rash itch?" If so, ask: "How bad is the itch?"  (Scale 1-10; or mild, moderate, severe)    Itching and burning 10/10 7. PAIN: "Does the rash hurt?" If so, ask: "How bad is the pain?"  (Scale 1-10; or mild, moderate, severe)     [place] 8. OTHER SYMPTOMS: "Do you have any other symptoms?" (e.g., fever)     no 9. PREGNANCY: "Is there any chance you are pregnant?" "When was your last menstrual period?"     no  Protocols used: RASH OR REDNESS - LOCALIZED-A-AH

## 2017-04-05 NOTE — ED Provider Notes (Signed)
[facility] EMERGENCY DEPARTMENT Provider Note   CSN: [id number] Arrival date & time: 04/05/17  0045     History   Chief Complaint Chief Complaint  Patient presents with  . Allergic Reaction    HPI Patricia Pena is a 52 y.o. female.  The history is provided by the patient.  Allergic Reaction  Presenting symptoms: rash   Presenting symptoms: no difficulty swallowing   Severity:  Moderate Duration: Several months ago. Context comment:  Suspect occupational exposure Relieved by:  Steroids Worsened by:  Nothing Ineffective treatments: Ointment. Patient presents for rash to her face intermittently over the past several months.  She reports at times it will improve with oral steroids and then return.  She reports it is itching and painful.  She has been seen by her primary doctor, is getting worked up for a malar rash and eczema.  She has been using ointments they have prescribed that have not improved her symptoms She denies any any acute worsening tonight Denies any facial or tongue swelling.  No shortness of breath.  No vomiting/diarrhea.  There is no rash anywhere else in her body.  She suspects this is related to her job, but is unclear if anything else is causing it.  No other new medications She denies any lesions in her mouth Past Medical History:  Diagnosis Date  . Arthritis   . Incisional hernia without mention of obstruction or gangrene   . Morbid obesity ([place])   . Seizures ([place])    last one was in 1985    Patient Active Problem List   Diagnosis Date Noted  . Essential hypertension 12/29/2015  . Arthritis   . History of seizures   . Morbid obesity ([place]) 02/10/2011  . Incisional hernia without mention of obstruction or gangrene 02/10/2011    Past Surgical History:  Procedure Laterality Date  . ABDOMINAL HYSTERECTOMY  2010   at [facility]  . BREAST CYST EXCISION  1994   right breast  . GALLBLADDER SURGERY  1990  . LEFT HEART CATHETERIZATION  WITH CORONARY ANGIOGRAM N/A 06/14/2011   Procedure: LEFT HEART CATHETERIZATION WITH CORONARY ANGIOGRAM;  Surgeon: Candee Furbish, MD;  Location: [facility] CATH LAB;  Service: Cardiovascular;  Laterality: N/A;  . VAGINA SURGERY  2007    OB History    No data available       Home Medications    Prior to Admission medications   Medication Sig Start Date End Date Taking? Authorizing Provider  CALCIUM-VITAMIN D PO Take 2,000 mg by mouth daily.     [provider]  co-enzyme Q-10 50 MG capsule Take 50 mg by mouth daily.    [provider]  Crisaborole (EUCRISA) 2 % OINT Apply 1 Dose topically 2 (two) times daily. Apply thin layer to the face twice daily. 03/21/17   Tereasa Coop, PA-C  Cyanocobalamin (VITAMIN B-12 PO) Take by mouth.    [provider]  hydrochlorothiazide (HYDRODIURIL) 25 MG tablet Take 1 tablet (25 mg total) by mouth daily. 03/05/17   Gale Journey, Damaris Hippo, PA-C  hydrOXYzine (ATARAX/VISTARIL) 25 MG tablet Take 1 tablet (25 mg total) by mouth every 6 (six) hours as needed for itching. 04/05/17   Ripley Fraise, MD  ibuprofen (ADVIL,MOTRIN) 200 MG tablet Take 800 mg by mouth every 6 (six) hours as needed. For pain    [provider]  lisinopril (PRINIVIL,ZESTRIL) 10 MG tablet Take 1 tablet (10 mg total) by mouth daily. 04/02/17   [name]  N, MD  predniSONE (DELTASONE) 10 MG tablet Take 5 tablets PO daily for 5 days, 4 tablets PO daily for 4 days, 3 tablets PO daily for 3 days, 2 tablets PO daily for 2 days, then take one pill PO for one day then STOP 04/05/17   Ripley Fraise, MD  triamcinolone cream (KENALOG) 0.1 % Apply 1 application topically 2 (two) times daily. Patient not taking: Reported on 03/21/2017 07/17/16   Janne Napoleon, NP    Family History Family History  Problem Relation Age of Onset  . Cancer Father        lung    Social History Social History   Tobacco Use  . Smoking status: Former Smoker    Packs/day: 1.50    Years: 35.00    Pack  years: 52.50    Last attempt to quit: 12/28/2009    Years since quitting: 7.2  . Smokeless tobacco: Never Used  Substance Use Topics  . Alcohol use: Yes    Comment: occasionally  . Drug use: No     Allergies   Codeine   Review of Systems Review of Systems  Constitutional: Negative for fever.  HENT: Negative for facial swelling and trouble swallowing.   Respiratory: Negative for shortness of breath.   Gastrointestinal: Negative for diarrhea and vomiting.  Skin: Positive for rash.  All other systems reviewed and are negative.    Physical Exam Updated Vital Signs BP 125/77   Pulse 86   Temp 98 F (36.7 C) (Oral)   Resp 16   SpO2 99%   Physical Exam CONSTITUTIONAL: Well developed/well nourished HEAD: Normocephalic/atraumatic EYES: EOMI/PERRL ENMT: Mucous membranes moist, no angioedema, see photo No oral lesions noted NECK: supple no meningeal signs SPINE/BACK:entire spine nontender CV: S1/S2 noted, no murmurs/rubs/gallops noted LUNGS: Lungs are clear to auscultation bilaterally, no apparent distress ABDOMEN: soft NEURO: Pt is awake/alert/appropriate, moves all extremitiesx4.  No facial droop.   EXTREMITIES: pulses normal/equal, full ROM SKIN: No rash to extremities or torso PSYCH: no abnormalities of mood noted, alert and oriented to situation    Patient gave verbal permission to utilize photo for medical documentation only The image was not stored on any personal device ED Treatments / Results  Labs (all labs ordered are listed, but only abnormal results are displayed) Labs Reviewed - No data to display  EKG  EKG Interpretation None       Radiology No results found.  Procedures Procedures (including critical care time)  Medications Ordered in ED Medications  predniSONE (DELTASONE) tablet 60 mg (60 mg Oral Given 04/05/17 0126)     Initial Impression / Assessment and Plan / ED Course  I have reviewed the triage vital signs and the nursing  notes.      Patient presents for facial rash she has had intermittently for months.  She admits that there is no acute change tonight.  She reports she checked in to the ER to get some answers.  She reports she does get relief with prednisone.  She is only had prednisone once in the past 6 months.  We will give her a 2-week course of prednisone, and prescribe Vistaril.  Will give her multiple referrals to dermatology.  There is no angioedema or any other signs of anaphylaxis.  Final Clinical Impressions(s) / ED Diagnoses   Final diagnoses:  Hives  Rash    ED Discharge Orders        Ordered    predniSONE (DELTASONE) 10 MG tablet  04/05/17 0150    hydrOXYzine (ATARAX/VISTARIL) 25 MG tablet  Every 6 hours PRN     04/05/17 0150       Ripley Fraise, MD 04/05/17 0205

## 2017-04-05 NOTE — Telephone Encounter (Signed)
There is no referral entered from our office. Do we want to place referral and send or does pt need to advise ED who referred her? Please advise.

## 2017-04-05 NOTE — Telephone Encounter (Signed)
Its negative.

## 2017-04-05 NOTE — Telephone Encounter (Signed)
Pt called.  Wants lupus result - not released yet

## 2017-04-05 NOTE — Telephone Encounter (Signed)
Spoke with pt. Advised of Lupus results - negative. Advised our referrals dept is attempting to find Dermatologist that can see her prior to May. She requested to leave a message if she is not at home.

## 2017-04-05 NOTE — Telephone Encounter (Signed)
Spoke with pt.  States she used cream until yesterday - face on fire.  Stopped the cream.  Pt states it got so bad she went to ED last pm.   ED put her back on prednisone.  Pt states out of work today due to pain in face - unable to smile, talk, etc without pain.  PT wants referral to dermatologist that will see her before MAY.    Message sent to [name] and Annie/Referrals.

## 2017-04-05 NOTE — ED Notes (Signed)
ED Provider at bedside. 

## 2017-04-05 NOTE — Telephone Encounter (Signed)
Have spoken to pt twice this am.  She is aware the hospital referred her to a dermatologist, and our referral staff is attempting to find a dermatologist who will see her as soon as possible, not in May.

## 2017-04-07 DIAGNOSIS — L309 Dermatitis, unspecified: Secondary | ICD-10-CM | POA: Diagnosis not present

## 2017-04-09 ENCOUNTER — Encounter: Payer: BLUE CROSS/BLUE SHIELD | Admitting: Physician Assistant

## 2017-04-09 ENCOUNTER — Other Ambulatory Visit: Payer: Self-pay | Admitting: Physician Assistant

## 2017-04-09 DIAGNOSIS — R21 Rash and other nonspecific skin eruption: Secondary | ICD-10-CM

## 2017-04-09 NOTE — Telephone Encounter (Signed)
I have placed the referral. [name], MS, PA-C 5:01 PM, 04/09/2017

## 2017-04-11 NOTE — Telephone Encounter (Signed)
Referral sent 3/6

## 2017-04-13 ENCOUNTER — Encounter: Payer: BLUE CROSS/BLUE SHIELD | Admitting: Physician Assistant

## 2017-04-16 ENCOUNTER — Ambulatory Visit: Payer: BLUE CROSS/BLUE SHIELD | Admitting: Physician Assistant

## 2017-04-16 VITALS — BP 134/84 | HR 99 | Temp 98.5°F | Resp 16 | Ht 62.0 in | Wt 275.0 lb

## 2017-04-16 DIAGNOSIS — I10 Essential (primary) hypertension: Secondary | ICD-10-CM

## 2017-04-16 MED ORDER — LISINOPRIL-HYDROCHLOROTHIAZIDE 20-25 MG PO TABS
1.0000 | ORAL_TABLET | Freq: Every day | ORAL | 3 refills | Status: DC
Start: 2017-04-16 — End: 2018-03-30

## 2017-04-16 NOTE — Progress Notes (Signed)
    04/16/2017 3:35 PM   DOB: [birth year] / MRN: [id number]  SUBJECTIVE:  Patricia Pena is a 52 y.o. female presenting for hypertension medication refills.  She denies a history of diabetes.  She only wants to focus on her blood pressure medications today.  She denies chest pain, shortness of breath, DOE, sudden vision changes.  She is taking prednisone chronically at this point for [facility] rash.  She has been to dermatology and they have started her on steroid cream and have continued her prednisone.  The patient thinks the rash is secondary to an exposure during a specific route at her job.  She is allergic to codeine.   She  has a past medical history of Arthritis, Incisional hernia without mention of obstruction or gangrene, Morbid obesity ([place]), and Seizures ([place]).    She  reports that she quit smoking about 7 years ago. She has a 52.50 pack-year smoking history. she has never used smokeless tobacco. She reports that she drinks alcohol. She reports that she does not use drugs. She  reports that she does not engage in sexual activity. The patient  has a past surgical history that includes Gallbladder surgery (1990); Abdominal hysterectomy (2010); Vagina surgery (2007); Breast cyst excision (1994); and left heart catheterization with coronary angiogram (N/A, 06/14/2011).  Her family history includes Cancer in her father.  ROS  Per HPI  The problem list and medications were reviewed and updated by myself where necessary and exist elsewhere in the encounter.   OBJECTIVE:  BP 134/84   Pulse 99   Temp 98.5 F (36.9 C) (Oral)   Resp 16   Ht 5\' 2"  (1.575 m)   Wt 275 lb (124.7 kg)   SpO2 96%   BMI 50.30 kg/m   Physical Exam  Constitutional: She is active.  Non-toxic appearance.  Cardiovascular: Normal rate, regular rhythm, S1 normal, S2 normal, normal heart sounds and intact distal pulses. Exam reveals no gallop, no friction rub and no decreased pulses.  No murmur  heard. Pulmonary/Chest: Effort normal. No tachypnea. She has no rales.  Abdominal: She exhibits no distension.  Musculoskeletal: She exhibits no edema.  Neurological: She is alert.  Skin: Skin is warm and dry. No rash noted. She is not diaphoretic. No pallor.    No results found for this or any previous visit (from the past 72 hour(s)).  No results found.  ASSESSMENT AND PLAN:  Diagnoses and all orders for this visit:  Essential hypertension -     lisinopril-hydrochlorothiazide (PRINZIDE,ZESTORETIC) 20-25 MG tablet; Take 1 tablet by mouth daily. -     Creatinine, serum -     Potassium    The patient is advised to call or return to clinic if she does not see an improvement in symptoms, or to seek the care of the closest emergency department if she worsens with the above plan.   [name], MHS, PA-C [facility] 04/16/2017 3:35 PM

## 2017-04-16 NOTE — Patient Instructions (Signed)
     IF you received an x-ray today, you will receive an invoice from [facility]. Please contact [facility] at [phone] with questions or concerns regarding your invoice.   IF you received labwork today, you will receive an invoice from [facility]. Please contact [facility] at [phone] with questions or concerns regarding your invoice.   Our billing staff will not be able to assist you with questions regarding bills from these companies.  You will be contacted with the lab results as soon as they are available. The fastest way to get your results is to activate your My Chart account. Instructions are located on the last page of this paperwork. If you have not heard from us regarding the results in 2 weeks, please contact this office.     

## 2017-04-17 LAB — CREATININE, SERUM
Creatinine, Ser: 0.74 mg/dL (ref 0.57–1.00)
GFR calc Af Amer: 107 mL/min/{1.73_m2} (ref >59)
GFR calc non Af Amer: 93 mL/min/{1.73_m2} (ref >59)

## 2017-04-17 LAB — POTASSIUM: Potassium: 4.3 mmol/L (ref 3.5–5.2)

## 2017-04-18 DIAGNOSIS — L309 Dermatitis, unspecified: Secondary | ICD-10-CM | POA: Diagnosis not present

## 2017-05-07 DIAGNOSIS — L309 Dermatitis, unspecified: Secondary | ICD-10-CM | POA: Diagnosis not present

## 2017-05-08 DIAGNOSIS — L309 Dermatitis, unspecified: Secondary | ICD-10-CM | POA: Diagnosis not present

## 2017-05-21 ENCOUNTER — Encounter: Payer: Self-pay | Admitting: Physician Assistant

## 2017-05-21 ENCOUNTER — Ambulatory Visit: Payer: BLUE CROSS/BLUE SHIELD | Admitting: Physician Assistant

## 2017-05-21 ENCOUNTER — Other Ambulatory Visit: Payer: Self-pay

## 2017-05-21 ENCOUNTER — Ambulatory Visit (INDEPENDENT_AMBULATORY_CARE_PROVIDER_SITE_OTHER): Payer: BLUE CROSS/BLUE SHIELD

## 2017-05-21 VITALS — BP 130/82 | HR 78 | Temp 98.0°F | Resp 16 | Ht 62.8 in | Wt 278.0 lb

## 2017-05-21 DIAGNOSIS — M25551 Pain in right hip: Secondary | ICD-10-CM | POA: Diagnosis not present

## 2017-05-21 DIAGNOSIS — Z0000 Encounter for general adult medical examination without abnormal findings: Secondary | ICD-10-CM

## 2017-05-21 DIAGNOSIS — I10 Essential (primary) hypertension: Secondary | ICD-10-CM | POA: Diagnosis not present

## 2017-05-21 DIAGNOSIS — M160 Bilateral primary osteoarthritis of hip: Secondary | ICD-10-CM | POA: Diagnosis not present

## 2017-05-21 LAB — POCT WET + KOH PREP
Trich by wet prep: ABSENT
YEAST BY KOH: ABSENT
Yeast by wet prep: ABSENT

## 2017-05-21 MED ORDER — CYCLOBENZAPRINE HCL 10 MG PO TABS: 5.0000 mg | ORAL_TABLET | Freq: Three times a day (TID) | ORAL | 0 refills | Status: DC | PRN

## 2017-05-21 NOTE — Progress Notes (Signed)
05/21/2017 9:18 AM   DOB: March 15, 1965 / MRN: [id number]  SUBJECTIVE:  Patricia Pena is a 52 y.o. female presenting for   She is allergic to codeine.   She  has a past medical history of Arthritis, Incisional hernia without mention of obstruction or gangrene, Morbid obesity ([place]), and Seizures ([place]).    She  reports that she quit smoking about 7 years ago. She has a 52.50 pack-year smoking history. She has never used smokeless tobacco. She reports that she drinks alcohol. She reports that she does not use drugs. She  reports that she does not engage in sexual activity. The patient  has a past surgical history that includes Gallbladder surgery (1990); Abdominal hysterectomy (2010); Vagina surgery (2007); Breast cyst excision (1994); and left heart catheterization with coronary angiogram (N/A, 06/14/2011).  Her family history includes Cancer in her father.  Review of Systems  Constitutional: Negative for chills, diaphoresis and fever.  HENT: Negative for sore throat.   Respiratory: Negative for shortness of breath.   Cardiovascular: Negative for chest pain, orthopnea and leg swelling.  Gastrointestinal: Negative for abdominal pain, blood in stool, constipation, diarrhea, melena and nausea.  Genitourinary: Negative for dysuria, flank pain, frequency, hematuria and urgency.  Skin: Negative for rash.  Neurological: Negative for tingling, tremors, sensory change, speech change, focal weakness and headaches.    The problem list and medications were reviewed and updated by myself where necessary and exist elsewhere in the encounter.   OBJECTIVE:  BP 130/82   Pulse 78   Temp 98 F (36.7 C) (Oral)   Resp 16   Ht 5' 2.8" (1.595 m)   Wt 278 lb (126.1 kg)   SpO2 98%   BMI 49.57 kg/m   Lab Results  Component Value Date   HGBA1C 5.8 09/21/2016    Physical Exam  Constitutional: She is oriented to person, place, and time. She appears well-developed and well-nourished. No distress.    Eyes: Pupils are equal, round, and reactive to light. EOM are normal.  Neck: Normal range of motion.  Cardiovascular: Normal rate and intact distal pulses.  Pulmonary/Chest: Effort normal.  Abdominal: Soft. Bowel sounds are normal. She exhibits no distension and no mass. There is no tenderness. There is no rebound and no guarding. No hernia.  Genitourinary: Vagina normal. No vaginal discharge found.  Genitourinary Comments: No cervix could be identified on exam.  Negative for tenderness on bimanual exam.   Musculoskeletal: Normal range of motion. She exhibits no edema, tenderness or deformity.  Right hip pain with faber and fadir, + acetabular grind. R knee negative for tenderness. ROM normal.  Ligaments intact to challenge.   Right leg - for swelling at the smallest circumference of the calf.   Neurological: She is alert and oriented to person, place, and time. No cranial nerve deficit.  Skin: Skin is warm and dry. She is not diaphoretic.  Psychiatric: She has a normal mood and affect.  Vitals reviewed.   Results for orders placed or performed in visit on 05/21/17 (from the past 72 hour(s))  POCT Wet + KOH Prep     Status: Abnormal   Collection Time: 05/21/17  9:10 AM  Result Value Ref Range   Yeast by KOH Absent Absent   Yeast by wet prep Absent Absent   WBC by wet prep None (A) Few   Clue Cells Wet Prep HPF POC None None   Trich by wet prep Absent Absent   Bacteria Wet Prep HPF POC  Few Few   Epithelial Cells By [employer] Pref ([facility]) Few None, Few, Too numerous to count   RBC,UR,HPF,POC None None RBC/hpf    Dg Hip Unilat W Or W/o Pelvis 2-3 Views Right  Result Date: 05/21/2017 CLINICAL DATA:  Right hip pain.  No known injury. EXAM: DG HIP (WITH OR WITHOUT PELVIS) 2-3V RIGHT COMPARISON:  None. FINDINGS: There is no acute bony or joint abnormality. Mild joint space narrowing about both hips appears symmetric from right to left. No focal bony lesion or evidence of avascular necrosis of  the femoral heads. Moderate degenerative change about the symphysis pubis is noted. Soft tissue structures are unremarkable. IMPRESSION: Mild to moderate bilateral hip osteoarthritis. No acute finding. Moderate osteoarthritis symphysis pubis. Electronically Signed   By: Inge Rise M.D.   On: 05/21/2017 09:15    ASSESSMENT AND PLAN:  [name] was seen today for pelvic pain.  Diagnoses and all orders for this visit:  Pain of right hip joint: See rads. See AVS for plan. RTC as needed. Would consider ortho referral if not improving with step therapy.  -     cyclobenzaprine (FLEXERIL) 10 MG tablet; Take 0.5-1 tablets (5-10 mg total) by mouth 3 (three) times daily as needed for muscle spasms. Do not mix with narcotics. May cause drowsiness. -     DG HIP UNILAT W OR W/O PELVIS 2-3 VIEWS RIGHT; Future  Routine health maintenance -     Hemoglobin A1c -     POCT Wet + KOH Prep  Essential hypertension -     Renal Function Panel    The patient is advised to call or return to clinic if she does not see an improvement in symptoms, or to seek the care of the closest emergency department if she worsens with the above plan.   [name], MHS, PA-C [facility] 05/21/2017 9:18 AM

## 2017-05-21 NOTE — Patient Instructions (Addendum)
  For pain in your hip take tylenol 1000 mg every eight hours as needed.  Add flexeril 5-10 mg every 8 hours as needed for breakthrough pain.    IF you received an x-ray today, you will receive an invoice from [facility]. Please contact [facility] at [phone] with questions or concerns regarding your invoice.   IF you received labwork today, you will receive an invoice from [facility]. Please contact [facility] at [phone] with questions or concerns regarding your invoice.   Our billing staff will not be able to assist you with questions regarding bills from these companies.  You will be contacted with the lab results as soon as they are available. The fastest way to get your results is to activate your My Chart account. Instructions are located on the last page of this paperwork. If you have not heard from [place] regarding the results in 2 weeks, please contact this office.

## 2017-05-22 ENCOUNTER — Telehealth: Payer: Self-pay | Admitting: Physician Assistant

## 2017-05-22 ENCOUNTER — Other Ambulatory Visit: Payer: Self-pay

## 2017-05-22 DIAGNOSIS — M25551 Pain in right hip: Secondary | ICD-10-CM

## 2017-05-22 LAB — RENAL FUNCTION PANEL
ALBUMIN: 4.1 g/dL (ref 3.5–5.5)
BUN / CREAT RATIO: 15 (ref 9–23)
BUN: 13 mg/dL (ref 6–24)
CALCIUM: 9.5 mg/dL (ref 8.7–10.2)
CHLORIDE: 101 mmol/L (ref 96–106)
CO2: 26 mmol/L (ref 20–29)
Creatinine, Ser: 0.84 mg/dL (ref 0.57–1.00)
GFR calc Af Amer: 92 mL/min/{1.73_m2} (ref >59)
GFR calc non Af Amer: 80 mL/min/{1.73_m2} (ref >59)
Glucose: 107 mg/dL — ABNORMAL HIGH (ref 65–99)
POTASSIUM: 3.8 mmol/L (ref 3.5–5.2)
Phosphorus: 3.8 mg/dL (ref 2.5–4.5)
Sodium: 143 mmol/L (ref 134–144)

## 2017-05-22 LAB — HEMOGLOBIN A1C
Est. average glucose Bld gHb Est-mCnc: 137 mg/dL
Hgb A1c MFr Bld: 6.4 % — ABNORMAL HIGH (ref 4.8–5.6)

## 2017-05-22 MED ORDER — MELOXICAM 7.5 MG PO TABS: 7.5000 mg | ORAL_TABLET | Freq: Two times a day (BID) | ORAL | 3 refills | Status: DC

## 2017-05-22 NOTE — Telephone Encounter (Signed)
Medication ordered. Pt advised not take any nsaids with this medication.

## 2017-05-22 NOTE — Telephone Encounter (Signed)
Please advise 

## 2017-05-22 NOTE — Telephone Encounter (Signed)
Copied from [place] [id number]. Topic: Quick Communication - Rx Refill/Question >> May 22, 2017  9:23 AM Margot Ables wrote: Medication: pt requesting something stronger - pt states that she is in a lot of pain, she cannot sleep - pt has taken ibuprofen and muscle relaxer prescribed yesterday and it doesn't do anything - she said she needs something stronger because she works on her feet all day - pt is at work so please leave detailed msg and she can check at [employer] (with phone number or street name): [id number] [address], [place] [phone] (Phone) [phone] (Fax)

## 2017-05-22 NOTE — Telephone Encounter (Signed)
Please write for Meloxicam 7.5 bid. #60 r3. If allergy to sulfa then naprosyn 375 bid #60 r3.

## 2017-05-23 ENCOUNTER — Telehealth: Payer: Self-pay

## 2017-05-23 MED ORDER — TRAMADOL HCL 50 MG PO TABS: 50.0000 mg | ORAL_TABLET | Freq: Three times a day (TID) | ORAL | 0 refills | Status: AC | PRN

## 2017-05-23 NOTE — Telephone Encounter (Signed)
Pt states that her pain is not in control. Per advice you stated you would order tramadol for pt.

## 2017-05-23 NOTE — Telephone Encounter (Signed)
Phone call to patient, relayed message from provider. Patient states, "That's all he can do for people with arthritis? Is just more prednisone?" Patient urged to make appointment if her symptoms are not resolving with current treatment, she is agreeable. Transferred up to front desk to schedule.

## 2017-05-23 NOTE — Telephone Encounter (Signed)
Copied from [place] [phone]. Topic: Quick Communication - Rx Refill/Question >> May 23, 2017  9:19 AM Darl Householder, RMA wrote: Patient calling to check status of medication increase, please return pt call

## 2017-05-23 NOTE — Telephone Encounter (Signed)
Phone call to patient. She states that she picked meloxicam 7.5mg  up last night and she is still painful.   Rates pain at a 7 on a 0-10 scale constantly, will spike up to an 8. 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 Has been supplementing meloxicam with Tylenol 1000mg  three times a day. She states, "I'm really hurting" and "My job isn't really understanding, I'd rather be home sitting on my heating pad".   Patient is requesting a stronger dosage of Meloxicam be sent to her pharmacy. Patient advised if she is still painful with Meloxicam she may need an appointment to be seen. She is agreeable to scheduling an additional visit if needed.   Provider, please advise.

## 2017-05-23 NOTE — Telephone Encounter (Signed)
That is the strongest dose.  It is equivalent to 800 mg of ibuprofen three times a day.  No stronger medicine available in the class. Please call her and explain. The other option I have is more prednisone. [name], MS, PA-C 1:44 PM, 05/23/2017

## 2017-05-25 ENCOUNTER — Other Ambulatory Visit: Payer: Self-pay

## 2017-05-25 ENCOUNTER — Encounter: Payer: Self-pay | Admitting: Physician Assistant

## 2017-05-25 ENCOUNTER — Ambulatory Visit: Payer: BLUE CROSS/BLUE SHIELD | Admitting: Physician Assistant

## 2017-05-25 VITALS — BP 128/88 | HR 93 | Temp 98.1°F | Resp 18 | Ht 62.0 in | Wt 275.2 lb

## 2017-05-25 DIAGNOSIS — M160 Bilateral primary osteoarthritis of hip: Secondary | ICD-10-CM

## 2017-05-25 NOTE — Progress Notes (Signed)
05/26/2017 8:51 AM   DOB: September 23, 1965 / MRN: [id number]  SUBJECTIVE:  Patricia Pena is a 52 y.o. female presenting  to discuss her options for bilateral hip pain.  Most recent office visit revealed normal abdominal exam along with normal labs and bilateral hip osteoarthritis.  She was prescribed meloxicam 7.5 to 15 mg daily and tells me that today she is about 75% improved.  She does not understand her medication options at this time.  She says Tylenol does not help at all.  She feels the meloxicam is dosed too low and she would like a stronger medication.  She does not want to take tramadol because she is trying to get a new job and is worried about getting drug tested.  She does not want an orthopedic referral because she will not take a shot in the hip nor will she undergo surgery.  She is not looked into water aerobics yet and she prefers not to see a physical therapist at this time.  She is allergic to codeine.   She  has a past medical history of Arthritis, Incisional hernia without mention of obstruction or gangrene, Morbid obesity ([place]), and Seizures ([place]).    She  reports that she quit smoking about 7 years ago. She has a 52.50 pack-year smoking history. She has never used smokeless tobacco. She reports that she drinks alcohol. She reports that she does not use drugs. She  reports that she does not engage in sexual activity. The patient  has a past surgical history that includes Gallbladder surgery (1990); Abdominal hysterectomy (2010); Vagina surgery (2007); Breast cyst excision (1994); and left heart catheterization with coronary angiogram (N/A, 06/14/2011).  Her family history includes Cancer in her father.  Review of Systems  Constitutional: Negative for chills, diaphoresis and fever.  Gastrointestinal: Negative for nausea.  Musculoskeletal: Positive for joint pain.  Skin: Negative for rash.  Neurological: Negative for dizziness.    The problem list and medications were reviewed  and updated by myself where necessary and exist elsewhere in the encounter.   OBJECTIVE:  BP 128/88 (BP Location: Right Arm, Patient Position: Sitting, Cuff Size: Large)   Pulse 93   Temp 98.1 F (36.7 C) (Oral)   Resp 18   Ht 5\' 2"  (1.575 m)   Wt 275 lb 3.2 oz (124.8 kg)   SpO2 95%   BMI 50.33 kg/m   Physical Exam  Constitutional: She is oriented to person, place, and time. She appears well-nourished. No distress.  Eyes: Pupils are equal, round, and reactive to light. EOM are normal.  Cardiovascular: Normal rate.  Pulmonary/Chest: Effort normal.  Abdominal: She exhibits no distension.  Neurological: She is alert and oriented to person, place, and time. No cranial nerve deficit. Gait normal.  Skin: Skin is dry. She is not diaphoretic.  Psychiatric: She has a normal mood and affect.  Vitals reviewed.   No results found for this or any previous visit (from the past 72 hour(s)).  No results found.  ASSESSMENT AND PLAN:  Patricia Pena was seen today for arthritis and follow-up.  Diagnoses and all orders for this visit:  Bilateral primary osteoarthritis of hip Comments: We had a long discussion about the spectrum of treatment for arthritis from exercise to surgery.  She has a better understanding of her options at this time.  She wants to continue with meloxicam at this time and add in Tylenol as needed.  She will take tramadol for breakthrough pain.  She will contact  me if she changes her mind about orthopedic referral or PT referral.  She will look into water aerobics as well.  The patient is advised to call or return to clinic if she does not see an improvement in symptoms, or to seek the care of the closest emergency department if she worsens with the above plan.   [name], MHS, PA-C [facility] 05/26/2017 8:51 AM

## 2017-05-25 NOTE — Patient Instructions (Signed)
     IF you received an x-ray today, you will receive an invoice from [facility]. Please contact [facility] at [phone] with questions or concerns regarding your invoice.   IF you received labwork today, you will receive an invoice from [facility]. Please contact [facility] at [phone] with questions or concerns regarding your invoice.   Our billing staff will not be able to assist you with questions regarding bills from these companies.  You will be contacted with the lab results as soon as they are available. The fastest way to get your results is to activate your My Chart account. Instructions are located on the last page of this paperwork. If you have not heard from us regarding the results in 2 weeks, please contact this office.     

## 2017-08-20 ENCOUNTER — Other Ambulatory Visit: Payer: Self-pay

## 2017-08-20 ENCOUNTER — Ambulatory Visit (INDEPENDENT_AMBULATORY_CARE_PROVIDER_SITE_OTHER): Payer: BLUE CROSS/BLUE SHIELD

## 2017-08-20 ENCOUNTER — Encounter: Payer: Self-pay | Admitting: Physician Assistant

## 2017-08-20 ENCOUNTER — Ambulatory Visit: Payer: BLUE CROSS/BLUE SHIELD | Admitting: Physician Assistant

## 2017-08-20 VITALS — BP 116/78 | HR 90 | Temp 98.5°F | Resp 18 | Ht 62.0 in | Wt 282.6 lb

## 2017-08-20 DIAGNOSIS — F17210 Nicotine dependence, cigarettes, uncomplicated: Secondary | ICD-10-CM

## 2017-08-20 DIAGNOSIS — R0989 Other specified symptoms and signs involving the circulatory and respiratory systems: Secondary | ICD-10-CM | POA: Diagnosis not present

## 2017-08-20 DIAGNOSIS — M160 Bilateral primary osteoarthritis of hip: Secondary | ICD-10-CM | POA: Diagnosis not present

## 2017-08-20 DIAGNOSIS — J40 Bronchitis, not specified as acute or chronic: Secondary | ICD-10-CM

## 2017-08-20 MED ORDER — AZITHROMYCIN 250 MG PO TABS: ORAL_TABLET | ORAL | 0 refills | Status: AC

## 2017-08-20 NOTE — Patient Instructions (Addendum)
  Get some rest. Drink plenty of fluids. Take antibiotics.     IF you received an x-ray today, you will receive an invoice from [facility]. Please contact [facility] at [phone] with questions or concerns regarding your invoice.   IF you received labwork today, you will receive an invoice from [facility]. Please contact [facility] at [phone] with questions or concerns regarding your invoice.   Our billing staff will not be able to assist you with questions regarding bills from these companies.  You will be contacted with the lab results as soon as they are available. The fastest way to get your results is to activate your My Chart account. Instructions are located on the last page of this paperwork. If you have not heard from [place] regarding the results in 2 weeks, please contact this office.

## 2017-08-20 NOTE — Progress Notes (Signed)
08/20/2017 5:32 PM   DOB: March 10, 1965 / MRN: [id number]  SUBJECTIVE:  Patricia Pena is a 52 y.o. female presenting for chest congestion, nasal congestion, cough with discolored sputum. Symptoms present for about 1 week.  The problem was better but is now worse. She has tried Mucinex with mild relief.  She has a history of radiographic hip arthritis and has tried NSAIDS with mild relief.  Sj  She is allergic to codeine.   She  has a past medical history of Arthritis, Incisional hernia without mention of obstruction or gangrene, Morbid obesity ([place]), and Seizures ([place]).    She  reports that she quit smoking about 7 years ago. She has a 52.50 pack-year smoking history. She has never used smokeless tobacco. She reports that she drinks alcohol. She reports that she does not use drugs. She  reports that she does not engage in sexual activity. The patient  has a past surgical history that includes Gallbladder surgery (1990); Abdominal hysterectomy (2010); Vagina surgery (2007); Breast cyst excision (1994); and left heart catheterization with coronary angiogram (N/A, 06/14/2011).  Her family history includes Cancer in her father.  Review of Systems  Constitutional: Negative for chills, diaphoresis and fever.  Eyes: Negative.   Respiratory: Negative for cough, hemoptysis, sputum production, shortness of breath and wheezing.   Cardiovascular: Negative for chest pain, orthopnea and leg swelling.  Gastrointestinal: Negative for nausea.  Skin: Negative for rash.  Neurological: Negative for dizziness, sensory change, speech change, focal weakness and headaches.    The problem list and medications were reviewed and updated by myself where necessary and exist elsewhere in the encounter.   OBJECTIVE:  BP 116/78   Pulse 90   Temp 98.5 F (36.9 C) (Oral)   Resp 18   Ht 5\' 2"  (1.575 m)   Wt 282 lb 9.6 oz (128.2 kg)   SpO2 95%   BMI 51.69 kg/m   Wt Readings from Last 3 Encounters:  08/20/17  282 lb 9.6 oz (128.2 kg)  05/25/17 275 lb 3.2 oz (124.8 kg)  05/21/17 278 lb (126.1 kg)   Temp Readings from Last 3 Encounters:  08/20/17 98.5 F (36.9 C) (Oral)  05/25/17 98.1 F (36.7 C) (Oral)  05/21/17 98 F (36.7 C) (Oral)   BP Readings from Last 3 Encounters:  08/20/17 116/78  05/25/17 128/88  05/21/17 130/82   Pulse Readings from Last 3 Encounters:  08/20/17 90  05/25/17 93  05/21/17 78    Physical Exam  Constitutional: She is oriented to person, place, and time. She appears well-nourished.  Non-toxic appearance. No distress.  Eyes: Pupils are equal, round, and reactive to light. EOM are normal.  Cardiovascular: Normal rate, regular rhythm, S1 normal, S2 normal, normal heart sounds and intact distal pulses. Exam reveals no gallop, no friction rub and no decreased pulses.  No murmur heard. Pulmonary/Chest: Effort normal. No stridor. No respiratory distress. She has no wheezes. She has no rales.  Abdominal: She exhibits no distension.  Musculoskeletal: She exhibits no edema or tenderness.  Neurological: She is alert and oriented to person, place, and time. She has normal strength and normal reflexes. She is not disoriented. She displays no atrophy. No cranial nerve deficit or sensory deficit. She exhibits normal muscle tone. Coordination and gait normal.  Skin: Skin is warm and dry. She is not diaphoretic. No pallor.  Psychiatric: She has a normal mood and affect. Her behavior is normal.  Vitals reviewed.   Lab Results  Component Value  Date   HGBA1C 6.4 (H) 05/21/2017    Lab Results  Component Value Date   WBC 9.0 08/02/2015   HGB 13.3 09/21/2016   HCT 39 09/21/2016   MCV 94.4 08/02/2015   PLT 234 09/21/2016    Lab Results  Component Value Date   CREATININE 0.84 05/21/2017   BUN 13 05/21/2017   NA 143 05/21/2017   K 3.8 05/21/2017   CL 101 05/21/2017   CO2 26 05/21/2017    Lab Results  Component Value Date   ALT 12 09/21/2016   AST 16 09/21/2016    ALKPHOS 45 09/21/2016   BILITOT <0.2 01/28/2016    Lab Results  Component Value Date   TSH 1.17 08/02/2015    Lab Results  Component Value Date   CHOL 172 09/21/2016   HDL 49 09/21/2016   LDLCALC 111 09/21/2016   TRIG 61 09/21/2016     ASSESSMENT AND PLAN:  Patricia Pena was seen today for cough.  Diagnoses and all orders for this visit:  Bronchitis: Given problem to patient would likely benefit from antibiotic and steroid.  She refuses to be on a steroid at this time.  Will start azithromycin and have advised that she continue her current at-home regimen. -     DG Chest 2 View; Future -     azithromycin (ZITHROMAX) 250 MG tablet; Take two tabs on day on and one daily thereafter.  Smokes with greater than 40 pack year history  Bilateral primary osteoarthritis of hip -     Ambulatory referral to Orthopedic Surgery    The patient is advised to call or return to clinic if she does not see an improvement in symptoms, or to seek the care of the closest emergency department if she worsens with the above plan.   [name], MHS, PA-C [facility] [place] [facility] [place] 08/20/2017 5:32 PM

## 2017-08-27 ENCOUNTER — Telehealth: Payer: Self-pay | Admitting: Family Medicine

## 2017-08-27 NOTE — Telephone Encounter (Signed)
Copied from [place] [id number]. Topic: Inquiry >> Aug 27, 2017  9:31 AM Charlynn Court wrote: Reason for CRM: Pt wants information about her arthritis. She wants someone to call her back.

## 2017-08-28 ENCOUNTER — Telehealth: Payer: Self-pay | Admitting: Family Medicine

## 2017-08-28 NOTE — Telephone Encounter (Signed)
Copied from [place] [id number]. Topic: General - Other >> Aug 28, 2017  8:47 AM Judyann Munson wrote: Reason for CRM: Pt wants information about her arthritis. She wants someone to call her back  at around 5pm. Her best contact [phone]. Please advise

## 2017-08-29 ENCOUNTER — Telehealth: Payer: Self-pay | Admitting: Physician Assistant

## 2017-08-29 NOTE — Telephone Encounter (Signed)
Copied from [place] [id number]. Topic: General - Other >> Aug 29, 2017  1:17 PM Cecelia Byars, NT wrote: Reason for CRM: Patient called and said she would  rather have a call from [facility] himself if possible for some issues she would rather discuss with him and she will be home after 300 pm please advise  [phone]  she is also aware  it will be Monday or so before he responds .

## 2017-08-30 NOTE — Telephone Encounter (Signed)
Spoke to patient, she has some concerns about her bone pain (arthritis) and depression she wants to talk to you about. Patient did not want give additional details.  Please advise, thank you.

## 2017-08-30 NOTE — Telephone Encounter (Signed)
Pt states she would rather speak to you herself instead of another clinical staff member

## 2017-08-31 ENCOUNTER — Encounter: Payer: Self-pay | Admitting: Physician Assistant

## 2017-09-01 NOTE — Telephone Encounter (Signed)
Office visit

## 2017-09-03 NOTE — Telephone Encounter (Signed)
Left detailed message that the patient would need an appointment

## 2017-09-13 ENCOUNTER — Ambulatory Visit (INDEPENDENT_AMBULATORY_CARE_PROVIDER_SITE_OTHER): Payer: BLUE CROSS/BLUE SHIELD | Admitting: Orthopaedic Surgery

## 2017-09-13 ENCOUNTER — Encounter (INDEPENDENT_AMBULATORY_CARE_PROVIDER_SITE_OTHER): Payer: Self-pay | Admitting: Orthopaedic Surgery

## 2017-09-13 VITALS — BP 132/86 | HR 93 | Ht 63.5 in | Wt 280.0 lb

## 2017-09-13 DIAGNOSIS — M25551 Pain in right hip: Secondary | ICD-10-CM | POA: Diagnosis not present

## 2017-09-13 DIAGNOSIS — M25552 Pain in left hip: Secondary | ICD-10-CM

## 2017-09-13 NOTE — Progress Notes (Signed)
Office Visit Note   Patient: Patricia Pena           Date of Birth: 1965/04/10           MRN: [id number] Visit Date: 09/13/2017              Requested by: Tereasa Coop, PA-C [place], [place] [address] PCP: Tereasa Coop, PA-C   Assessment & Plan: Visit Diagnoses:  1. Pain of both hip joints      Plan: Patricia Pena has mild osteoarthritis of both hips.  Presently she is not symptomatic.  Long discussion regarding multiple joint complaints and need for weight loss and an exercise regimen.  We have also discussed appropriate medicines when she is symptomatic.  We will plan to see back on a as needed basis. Mild arthritis of both hips by plain films.  Concerned that her young age that she will have problems in the future and have discussed at length exercises and weight loss.  No specific treatment for her hips at this point.  She might even be having some referred pain from her lumbar spine  Follow-Up Instructions: Return if symptoms worsen or fail to improve.   Orders:  No orders of the defined types were placed in this encounter.  No orders of the defined types were placed in this encounter.     Procedures: No procedures performed   Clinical Data: No additional findings.   Subjective: Chief Complaint  Patient presents with  . New Patient (Initial Visit)    BI LAT HIP PAIN FOR 2-3 YRS GETTING WORSE PT.WAS REFERRED BY DR. Carlis Abbott ALSO HAS MULTIPLE JOINT PAIN FOR 2-3.  HAS HX OF OSTEOARTHIRITIS  Patricia Pena the office for evaluation of multiple joint complaints and more recently a problem with both of her "hips".  She is had some issues off and on for many years including problems with her shoulders and knees and hips and even her back.  She works for [employer] in [employer] and is on her feet most of the day.  She also was hanging close on a hanger with overhead activity.  She is had a number of issues with rashes and has been seen by a  dermatologist and her family physician.  She thinks that she may have eczema.  She is been evaluated for possible lupus.  She denies any specific injury or trauma but did develop insidious onset of groin pain and bilateral posterior hip pain several months ago x-rays of her pelvis were obtained which I reviewed on the PACS system.  Mild arthritis of both hip joints.  Recently she is asymptomatic.  There is a strong family history of osteoporosis and she is taking prophylactically calcium and vitamin D.  She has tried Tylenol and aspirin in the past when she is having pain it seems to make a little difference.  She has not had any numbness or tingling she is aware that she is overweight.  BMI is 48  HPI  Review of Systems  Constitutional: Negative for fatigue and fever.  HENT: Negative for ear pain.   Eyes: Negative for pain.  Respiratory: Negative for cough and shortness of breath.   Cardiovascular: Negative for leg swelling.  Gastrointestinal: Negative for constipation and diarrhea.  Genitourinary: Negative for difficulty urinating.  Musculoskeletal: Positive for back pain and neck pain.  Skin: Positive for rash.  Allergic/Immunologic: Negative for food allergies.  Hematological: Bruises/bleeds easily.  Psychiatric/Behavioral: Negative for sleep disturbance.  Objective: Vital Signs: BP 132/86 (BP Location: Left Arm, Patient Position: Sitting, Cuff Size: Normal)   Pulse 93   Ht 5' 3.5" (1.613 m)   Wt 280 lb (127 kg)   BMI 48.82 kg/m   Physical Exam  Constitutional: She is oriented to person, place, and time. She appears well-developed and well-nourished.  HENT:  Mouth/Throat: Oropharynx is clear and moist.  Eyes: Pupils are equal, round, and reactive to light. EOM are normal.  Pulmonary/Chest: Effort normal.  Neurological: She is alert and oriented to person, place, and time.  Skin: Skin is warm and dry.  Psychiatric: She has a normal mood and affect. Her behavior is normal.      Ortho Exam awake alert and oriented x3.  Comfortable sitting.  Full overhead motion of both shoulders with a little "ache".  Good strength.  No impingement or empty can testing.  Does have skin rashes in both of her hands that looks like eczema.  No pain.  No swelling.  No facial rash.  Painless range of motion of both hips with internal/external rotation. very large legs and large abdominal panniculus.  Walks without a limp.  Straight leg raise negative.  Motor and sensory exam intact Specialty Comments:  No specialty comments available.  Imaging: No results found.   PMFS History: Patient Active Problem List   Diagnosis Date Noted  . Essential hypertension 12/29/2015  . Arthritis   . History of seizures   . Morbid obesity ([place]) 02/10/2011  . Incisional hernia without mention of obstruction or gangrene 02/10/2011   Past Medical History:  Diagnosis Date  . Arthritis   . Incisional hernia without mention of obstruction or gangrene   . Morbid obesity ([place])   . Seizures ([place])    last one was in 1985    Family History  Problem Relation Age of Onset  . Cancer Father        lung    Past Surgical History:  Procedure Laterality Date  . ABDOMINAL HYSTERECTOMY  2010   at [facility]  . BREAST CYST EXCISION  1994   right breast  . GALLBLADDER SURGERY  1990  . LEFT HEART CATHETERIZATION WITH CORONARY ANGIOGRAM N/A 06/14/2011   Procedure: LEFT HEART CATHETERIZATION WITH CORONARY ANGIOGRAM;  Surgeon: Candee Furbish, MD;  Location: [facility] CATH LAB;  Service: Cardiovascular;  Laterality: N/A;  . VAGINA SURGERY  2007   Social History   Occupational History  . Not on file  Tobacco Use  . Smoking status: Former Smoker    Packs/day: 1.50    Years: 35.00    Pack years: 52.50    Last attempt to quit: 12/28/2009    Years since quitting: 7.7  . Smokeless tobacco: Never Used  Substance and Sexual Activity  . Alcohol use: Yes    Comment: occasionally  . Drug use: No  . Sexual activity: Never

## 2017-09-15 ENCOUNTER — Encounter: Payer: Self-pay | Admitting: Urgent Care

## 2017-09-15 ENCOUNTER — Ambulatory Visit: Payer: BLUE CROSS/BLUE SHIELD | Admitting: Urgent Care

## 2017-09-15 ENCOUNTER — Other Ambulatory Visit: Payer: Self-pay

## 2017-09-15 VITALS — BP 129/84 | HR 67 | Temp 98.4°F | Resp 16 | Ht 63.5 in | Wt 288.2 lb

## 2017-09-15 DIAGNOSIS — F329 Major depressive disorder, single episode, unspecified: Secondary | ICD-10-CM

## 2017-09-15 DIAGNOSIS — R21 Rash and other nonspecific skin eruption: Secondary | ICD-10-CM

## 2017-09-15 MED ORDER — BUPROPION HCL ER (SR) 150 MG PO TB12: 150.0000 mg | ORAL_TABLET | Freq: Every day | ORAL | 1 refills | Status: DC

## 2017-09-15 MED ORDER — TRIAMCINOLONE ACETONIDE 0.1 % EX CREA: 1.0000 | TOPICAL_CREAM | Freq: Two times a day (BID) | CUTANEOUS | 0 refills | Status: DC

## 2017-09-15 NOTE — Patient Instructions (Addendum)
Salads - kale, spinach, cabbage, spring mix; use seeds like pumpkin seeds or sunflower seeds; you can also use 1-2 hard boiled eggs. Fruits - avocadoes, berries (blueberries, raspberries, blackberries), apples, oranges, pomegranate, grapefruit Seeds - quinoa, chia seeds; you can also incorporate oatmeal Vegetables - aspargus, cauliflower, broccoli, green beans, brussel spouts, bell peppers; stay away from starchy vegetables like potatoes, carrots, peas     Living With Depression Everyone experiences occasional disappointment, sadness, and loss in their lives. When you are feeling down, blue, or sad for at least 2 weeks in a row, it may mean that you have depression. Depression can affect your thoughts and feelings, relationships, daily activities, and physical health. It is caused by changes in the way your brain functions. If you receive a diagnosis of depression, your health care provider will tell you which type of depression you have and what treatment options are available to you. If you are living with depression, there are ways to help you recover from it and also ways to prevent it from coming back. How to cope with lifestyle changes Coping with stress Stress is your body's reaction to life changes and events, both good and bad. Stressful situations may include:  Getting married.  The death of a spouse.  Losing a job.  Retiring.  Having a baby.  Stress can last just a few hours or it can be ongoing. Stress can play a major role in depression, so it is important to learn both how to cope with stress and how to think about it differently. Talk with your health care provider or a counselor if you would like to learn more about stress reduction. He or she may suggest some stress reduction techniques, such as:  Music therapy. This can include creating music or listening to music. Choose music that you enjoy and that inspires you.  Mindfulness-based meditation. This kind of meditation  can be done while sitting or walking. It involves being aware of your normal breaths, rather than trying to control your breathing.  Centering prayer. This is a kind of meditation that involves focusing on a spiritual word or phrase. Choose a word, phrase, or sacred image that is meaningful to you and that brings you peace.  Deep breathing. To do this, expand your stomach and inhale slowly through your nose. Hold your breath for 3-5 seconds, then exhale slowly, allowing your stomach muscles to relax.  Muscle relaxation. This involves intentionally tensing muscles then relaxing them.  Choose a stress reduction technique that fits your lifestyle and personality. Stress reduction techniques take time and practice to develop. Set aside 5-15 minutes a day to do them. Therapists can offer training in these techniques. The training may be covered by some insurance plans. Other things you can do to manage stress include:  Keeping a stress diary. This can help you learn what triggers your stress and ways to control your response.  Understanding what your limits are and saying no to requests or events that lead to a schedule that is too full.  Thinking about how you respond to certain situations. You may not be able to control everything, but you can control how you react.  Adding humor to your life by watching funny films or TV shows.  Making time for activities that help you relax and not feeling guilty about spending your time this way.  Medicines Your health care provider may suggest certain medicines if he or she feels that they will help improve your condition. Avoid using  alcohol and other substances that may prevent your medicines from working properly (may interact). It is also important to:  Talk with your pharmacist or health care provider about all the medicines that you take, their possible side effects, and what medicines are safe to take together.  Make it your goal to take part in all  treatment decisions (shared decision-making). This includes giving input on the side effects of medicines. It is best if shared decision-making with your health care provider is part of your total treatment plan.  If your health care provider prescribes a medicine, you may not notice the full benefits of it for 4-8 weeks. Most people who are treated for depression need to be on medicine for at least 6-12 months after they feel better. If you are taking medicines as part of your treatment, do not stop taking medicines without first talking to your health care provider. You may need to have the medicine slowly decreased (tapered) over time to decrease the risk of harmful side effects. Relationships Your health care provider may suggest family therapy along with individual therapy and drug therapy. While there may not be family problems that are causing you to feel depressed, it is still important to make sure your family learns as much as they can about your mental health. Having your family's support can help make your treatment successful. How to recognize changes in your condition Everyone has a different response to treatment for depression. Recovery from major depression happens when you have not had signs of major depression for two months. This may mean that you will start to:  Have more interest in doing activities.  Feel less hopeless than you did 2 months ago.  Have more energy.  Overeat less often, or have better or improving appetite.  Have better concentration.  Your health care provider will work with you to decide the next steps in your recovery. It is also important to recognize when your condition is getting worse. Watch for these signs:  Having fatigue or low energy.  Eating too much or too little.  Sleeping too much or too little.  Feeling restless, agitated, or hopeless.  Having trouble concentrating or making decisions.  Having unexplained physical  complaints.  Feeling irritable, angry, or aggressive.  Get help as soon as you or your family members notice these symptoms coming back. How to get support and help from others How to talk with friends and family members about your condition Talking to friends and family members about your condition can provide you with one way to get support and guidance. Reach out to trusted friends or family members, explain your symptoms to them, and let them know that you are working with a health care provider to treat your depression. Financial resources Not all insurance plans cover mental health care, so it is important to check with your insurance carrier. If paying for co-pays or counseling services is a problem, search for a local or county mental health care center. They may be able to offer public mental health care services at low or no cost when you are not able to see a private health care provider. If you are taking medicine for depression, you may be able to get the generic form, which may be less expensive. Some makers of prescription medicines also offer help to patients who cannot afford the medicines they need. Follow these instructions at home:  Get the right amount and quality of sleep.  Cut down on using caffeine, tobacco,  alcohol, and other potentially harmful substances.  Try to exercise, such as walking or lifting small weights.  Take over-the-counter and prescription medicines only as told by your health care provider.  Eat a healthy diet that includes plenty of vegetables, fruits, whole grains, low-fat dairy products, and lean protein. Do not eat a lot of foods that are high in solid fats, added sugars, or salt.  Keep all follow-up visits as told by your health care provider. This is important. Contact a health care provider if:  You stop taking your antidepressant medicines, and you have any of these symptoms: ? Nausea. ? Headache. ? Feeling lightheaded. ? Chills and body  aches. ? Not being able to sleep (insomnia).  You or your friends and family think your depression is getting worse. Get help right away if:  You have thoughts of hurting yourself or others. If you ever feel like you may hurt yourself or others, or have thoughts about taking your own life, get help right away. You can go to your nearest emergency department or call:  Your local emergency services (911 in the [place]  A suicide crisis helpline, such as the [facility] at 6186192130. This is open 24-hours a day.  Summary  If you are living with depression, there are ways to help you recover from it and also ways to prevent it from coming back.  Work with your health care team to create a management plan that includes counseling, stress management techniques, and healthy lifestyle habits. This information is not intended to replace advice given to you by your health care provider. Make sure you discuss any questions you have with your health care provider. Document Released: 12/27/2015 Document Revised: 12/27/2015 Document Reviewed: 12/27/2015 Elsevier Interactive Patient Education  2018 [employer].    Bupropion tablets (Depression/Mood Disorders) What is this medicine? BUPROPION (byoo PROE pee on) is used to treat depression. This medicine may be used for other purposes; ask your health care provider or pharmacist if you have questions. COMMON BRAND NAME(S): Wellbutrin What should I tell my health care provider before I take this medicine? They need to know if you have any of these conditions: -an eating disorder, such as anorexia or bulimia -bipolar disorder or psychosis -diabetes or high blood sugar, treated with medication -glaucoma -heart disease, previous heart attack, or irregular heart beat -head injury or brain tumor -high blood pressure -kidney or liver disease -seizures -suicidal thoughts or a previous suicide attempt -Tourette's  syndrome -weight loss -an unusual or allergic reaction to bupropion, other medicines, foods, dyes, or preservatives -breast-feeding -pregnant or trying to become pregnant How should I use this medicine? Take this medicine by mouth with a glass of water. Follow the directions on the prescription label. You can take it with or without food. If it upsets your stomach, take it with food. Take your medicine at regular intervals. Do not take your medicine more often than directed. Do not stop taking this medicine suddenly except upon the advice of your doctor. Stopping this medicine too quickly may cause serious side effects or your condition may worsen. A special MedGuide will be given to you by the pharmacist with each prescription and refill. Be sure to read this information carefully each time. Talk to your pediatrician regarding the use of this medicine in children. Special care may be needed. Overdosage: If you think you have taken too much of this medicine contact a poison control center or emergency room at once. NOTE: This medicine  is only for you. Do not share this medicine with others. What if I miss a dose? If you miss a dose, take it as soon as you can. If it is less than four hours to your next dose, take only that dose and skip the missed dose. Do not take double or extra doses. What may interact with this medicine? Do not take this medicine with any of the following medications: -linezolid -MAOIs like Azilect, Carbex, Eldepryl, Marplan, Nardil, and Parnate -methylene blue (injected into a vein) -other medicines that contain bupropion like Zyban This medicine may also interact with the following medications: -alcohol -certain medicines for anxiety or sleep -certain medicines for blood pressure like metoprolol, propranolol -certain medicines for depression or psychotic disturbances -certain medicines for HIV or AIDS like efavirenz, lopinavir, nelfinavir, ritonavir -certain medicines  for irregular heart beat like propafenone, flecainide -certain medicines for Parkinson's disease like amantadine, levodopa -certain medicines for seizures like carbamazepine, phenytoin, phenobarbital -cimetidine -clopidogrel -cyclophosphamide -digoxin -furazolidone -isoniazid -nicotine -orphenadrine -procarbazine -steroid medicines like prednisone or cortisone -stimulant medicines for attention disorders, weight loss, or to stay awake -tamoxifen -theophylline -thiotepa -ticlopidine -tramadol -warfarin This list may not describe all possible interactions. Give your health care provider a list of all the medicines, herbs, non-prescription drugs, or dietary supplements you use. Also tell them if you smoke, drink alcohol, or use illegal drugs. Some items may interact with your medicine. What should I watch for while using this medicine? Tell your doctor if your symptoms do not get better or if they get worse. Visit your doctor or health care professional for regular checks on your progress. Because it may take several weeks to see the full effects of this medicine, it is important to continue your treatment as prescribed by your doctor. Patients and their families should watch out for new or worsening thoughts of suicide or depression. Also watch out for sudden changes in feelings such as feeling anxious, agitated, panicky, irritable, hostile, aggressive, impulsive, severely restless, overly excited and hyperactive, or not being able to sleep. If this happens, especially at the beginning of treatment or after a change in dose, call your health care professional. Avoid alcoholic drinks while taking this medicine. Drinking excessive alcoholic beverages, using sleeping or anxiety medicines, or quickly stopping the use of these agents while taking this medicine may increase your risk for a seizure. Do not drive or use heavy machinery until you know how this medicine affects you. This medicine can  impair your ability to perform these tasks. Do not take this medicine close to bedtime. It may prevent you from sleeping. Your mouth may get dry. Chewing sugarless gum or sucking hard candy, and drinking plenty of water may help. Contact your doctor if the problem does not go away or is severe. What side effects may I notice from receiving this medicine? Side effects that you should report to your doctor or health care professional as soon as possible: -allergic reactions like skin rash, itching or hives, swelling of the face, lips, or tongue -breathing problems -changes in vision -confusion -elevated mood, decreased need for sleep, racing thoughts, impulsive behavior -fast or irregular heartbeat -hallucinations, loss of contact with reality -increased blood pressure -redness, blistering, peeling or loosening of the skin, including inside the mouth -seizures -suicidal thoughts or other mood changes -unusually weak or tired -vomiting Side effects that usually do not require medical attention (report to your doctor or health care professional if they continue or are bothersome): -constipation -headache -loss of  appetite -nausea -tremors -weight loss This list may not describe all possible side effects. Call your doctor for medical advice about side effects. You may report side effects to FDA at 1-800-FDA-1088. Where should I keep my medicine? Keep out of the reach of children. Store at room temperature between 20 and 25 degrees C (68 and 77 degrees F), away from direct sunlight and moisture. Keep tightly closed. Throw away any unused medicine after the expiration date. NOTE: This sheet is a summary. It may not cover all possible information. If you have questions about this medicine, talk to your doctor, pharmacist, or health care provider.  2018 Elsevier/Gold Standard (2015-07-16 13:44:21)    Please check your blood pressure once weekly. Try to check the blood pressure around the  same time each day that you check it after a period of resting in a seated position for 5-10 minutes. The readings should be between 789-381 systolic (top number), between 01-75 diastolic (bottom number). If your blood pressure falls outside this range consistently (i.e. 3-4 weeks in a row), then come back for a recheck.    Bupropion sustained-release tablets (Depression/Mood Disorders) What is this medicine? BUPROPION (byoo PROE pee on) is used to treat depression. This medicine may be used for other purposes; ask your health care provider or pharmacist if you have questions. COMMON BRAND NAME(S): Budeprion SR, Wellbutrin SR What should I tell my health care provider before I take this medicine? They need to know if you have any of these conditions: -an eating disorder, such as anorexia or bulimia -bipolar disorder or psychosis -diabetes or high blood sugar, treated with medication -glaucoma -head injury or brain tumor -heart disease, previous heart attack, or irregular heart beat -high blood pressure -kidney or liver disease -seizures -suicidal thoughts or a previous suicide attempt -Tourette's syndrome -weight loss -an unusual or allergic reaction to bupropion, other medicines, foods, dyes, or preservatives -breast-feeding -pregnant or trying to become pregnant How should I use this medicine? Take this medicine by mouth with a glass of water. Follow the directions on the prescription label. You can take it with or without food. If it upsets your stomach, take it with food. Do not cut, crush or chew this medicine. Take your medicine at regular intervals. If you take this medicine more than once a day, take your second dose at least 8 hours after you take your first dose. To limit difficulty in sleeping, avoid taking this medicine at bedtime. Do not take your medicine more often than directed. Do not stop taking this medicine suddenly except upon the advice of your doctor. Stopping this  medicine too quickly may cause serious side effects or your condition may worsen. A special MedGuide will be given to you by the pharmacist with each prescription and refill. Be sure to read this information carefully each time. Talk to your pediatrician regarding the use of this medicine in children. Special care may be needed. Overdosage: If you think you have taken too much of this medicine contact a poison control center or emergency room at once. NOTE: This medicine is only for you. Do not share this medicine with others. What if I miss a dose? If you miss a dose, skip the missed dose and take your next tablet at the regular time. There should be at least 8 hours between doses. Do not take double or extra doses. What may interact with this medicine? Do not take this medicine with any of the following medications: -linezolid -MAOIs like Azilect,  Carbex, Eldepryl, Marplan, Nardil, and Parnate -methylene blue (injected into a vein) -other medicines that contain bupropion like Zyban This medicine may also interact with the following medications: -alcohol -certain medicines for anxiety or sleep -certain medicines for blood pressure like metoprolol, propranolol -certain medicines for depression or psychotic disturbances -certain medicines for HIV or AIDS like efavirenz, lopinavir, nelfinavir, ritonavir -certain medicines for irregular heart beat like propafenone, flecainide -certain medicines for Parkinson's disease like amantadine, levodopa -certain medicines for seizures like carbamazepine, phenytoin, phenobarbital -cimetidine -clopidogrel -cyclophosphamide -digoxin -furazolidone -isoniazid -nicotine -orphenadrine -procarbazine -steroid medicines like prednisone or cortisone -stimulant medicines for attention disorders, weight loss, or to stay awake -tamoxifen -theophylline -thiotepa -ticlopidine -tramadol -warfarin This list may not describe all possible interactions. Give  your health care provider a list of all the medicines, herbs, non-prescription drugs, or dietary supplements you use. Also tell them if you smoke, drink alcohol, or use illegal drugs. Some items may interact with your medicine. What should I watch for while using this medicine? Tell your doctor if your symptoms do not get better or if they get worse. Visit your doctor or health care professional for regular checks on your progress. Because it may take several weeks to see the full effects of this medicine, it is important to continue your treatment as prescribed by your doctor. Patients and their families should watch out for new or worsening thoughts of suicide or depression. Also watch out for sudden changes in feelings such as feeling anxious, agitated, panicky, irritable, hostile, aggressive, impulsive, severely restless, overly excited and hyperactive, or not being able to sleep. If this happens, especially at the beginning of treatment or after a change in dose, call your health care professional. Avoid alcoholic drinks while taking this medicine. Drinking excessive alcoholic beverages, using sleeping or anxiety medicines, or quickly stopping the use of these agents while taking this medicine may increase your risk for a seizure. Do not drive or use heavy machinery until you know how this medicine affects you. This medicine can impair your ability to perform these tasks. Do not take this medicine close to bedtime. It may prevent you from sleeping. Your mouth may get dry. Chewing sugarless gum or sucking hard candy, and drinking plenty of water may help. Contact your doctor if the problem does not go away or is severe. What side effects may I notice from receiving this medicine? Side effects that you should report to your doctor or health care professional as soon as possible: -allergic reactions like skin rash, itching or hives, swelling of the face, lips, or tongue -breathing problems -changes in  vision -confusion -elevated mood, decreased need for sleep, racing thoughts, impulsive behavior -fast or irregular heartbeat -hallucinations, loss of contact with reality -increased blood pressure -redness, blistering, peeling or loosening of the skin, including inside the mouth -seizures -suicidal thoughts or other mood changes -unusually weak or tired -vomiting Side effects that usually do not require medical attention (report to your doctor or health care professional if they continue or are bothersome): -constipation -headache -loss of appetite -nausea -tremors -weight loss This list may not describe all possible side effects. Call your doctor for medical advice about side effects. You may report side effects to FDA at 1-800-FDA-1088. Where should I keep my medicine? Keep out of the reach of children. Store at room temperature between 20 and 25 degrees C (68 and 77 degrees F), away from direct sunlight and moisture. Keep tightly closed. Throw away any unused medicine after the expiration  date. NOTE: This sheet is a summary. It may not cover all possible information. If you have questions about this medicine, talk to your doctor, pharmacist, or health care provider.  2018 Elsevier/Gold Standard (2015-07-16 13:52:19)     IF you received an x-ray today, you will receive an invoice from [facility]. Please contact [facility] at [phone] with questions or concerns regarding your invoice.   IF you received labwork today, you will receive an invoice from [facility]. Please contact [facility] at [phone] with questions or concerns regarding your invoice.   Our billing staff will not be able to assist you with questions regarding bills from these companies.  You will be contacted with the lab results as soon as they are available. The fastest way to get your results is to activate your My Chart account. Instructions are located on the last page of this paperwork.  If you have not heard from [place] regarding the results in 2 weeks, please contact this office.

## 2017-09-15 NOTE — Progress Notes (Signed)
MRN: [id number] DOB: 1965/08/19  Subjective:   Patricia Pena is a 52 y.o. female presenting for 83-month history of recurrent and intermittent rash over her hands extending up to her wrist.  Patient has had significant difficulty with a rash over her face as well and is currently being managed by dermatologist.  She was given desoximetasone for management of her facial rash which is improved.  She started to use some of this on her hands but has gotten minimal relief.  She is previously used Eucerin as well with worsening of her rash.  She denies coming into contact with poisonous plants.  Rash is primarily itchy and had allergy testing confirming that she is allergic to preservatives.  He is using products for gentle skin.  Today, patient also reports that she would like help with depression.  She has had a very difficult year and has lost her mother and a few friends and death.  She also lost 1 of her birds that she had had for more than 15 years.  Patient has a very difficult job as well with [employer] that is very demanding.  She is not allowed to take any restroom breaks or drink water consistently.  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  Denies SI, HI.  She currently lives with 1 of her sons and his wife, she is not close to him.  She feels very lonely and has had a very difficult time coping with her current life.  She states that she has tried to improve her situation by taking a class to see if she can apply for different job but is has really wanting something to help with her depression.  She  reports that she quit smoking about 7 years ago. She has a 52.50 pack-year smoking history. She has never used smokeless tobacco. She reports that she drinks alcohol. She reports that she does not use drugs.   Annaliah has a current medication list which includes the following prescription(s): calcium-vitamin d, cholecalciferol, co-enzyme q-10, crisaborole, cyanocobalamin, desoximetasone,  hydroxyzine, ibuprofen, lisinopril-hydrochlorothiazide, meloxicam, fish oil, triamcinolone cream, cyclobenzaprine, and hydrochlorothiazide, and the following Facility-Administered Medications: methylprednisolone sodium succinate. Also is allergic to codeine.  Aily  has a past medical history of Arthritis, Incisional hernia without mention of obstruction or gangrene, Morbid obesity ([place]), and Seizures ([place]). Also  has a past surgical history that includes Gallbladder surgery (1990); Abdominal hysterectomy (2010); Vagina surgery (2007); Breast cyst excision (1994); and left heart catheterization with coronary angiogram (N/A, 06/14/2011).  Objective:   Vitals: BP 129/84   Pulse 67   Temp 98.4 F (36.9 C)   Resp 16   Ht 5' 3.5" (1.613 m)   Wt 288 lb 3.2 oz (130.7 kg)   SpO2 95%   BMI 50.25 kg/m   BP Readings from Last 3 Encounters:  09/15/17 129/84  09/13/17 132/86  08/20/17 116/78    Physical Exam  Constitutional: She is oriented to person, place, and time. She appears well-developed and well-nourished.  Cardiovascular: Normal rate.  Pulmonary/Chest: Effort normal.  Neurological: She is alert and oriented to person, place, and time.  Skin:  Patient has dry scaly plaque-like rash over dorsal aspect of her hands bilaterally.  There is no pain, drainage of pus or bleeding, erythema or warmth.  Psychiatric: Her mood appears not anxious. Her affect is not angry, not blunt, not labile and not inappropriate. Her speech is not rapid and/or pressured, not delayed, not tangential and not slurred.  She is not agitated, not aggressive, not hyperactive, not slowed, not withdrawn and not combative. She exhibits a depressed mood. She expresses no homicidal and no suicidal ideation. She is communicative.  Patient is very tearful at different parts of our office visit due to feeling overwhelmed from opening up about her depression and grieving the loss of her mother.   Assessment and Plan :   Major  depressive disorder, remission status unspecified, unspecified whether recurrent  Rash and nonspecific skin eruption  Patient is to start Wellbutrin.  Due to her weight and difficulty with managing her diet I believe this is the best option despite her elevated blood pressure.  Counseled that we may need to add amlodipine if she does well with Wellbutrin but her blood pressure goes up.  She is okay with this.  She also prefers to do a low dose of 150 mg once daily.  For her hands we will try a triamcinolone cream.  Return to clinic precautions reviewed with patient.  I counseled that she can follow-up with PA Clark in a couple of weeks or with me in 4 weeks. Counseled patient on potential for adverse effects with medications prescribed today, patient verbalized understanding.   Jaynee Eagles, PA-C [facility] [phone] 09/15/2017  3:17 PM

## 2017-09-16 ENCOUNTER — Encounter: Payer: Self-pay | Admitting: Urgent Care

## 2017-10-12 ENCOUNTER — Ambulatory Visit: Payer: BLUE CROSS/BLUE SHIELD | Admitting: Urgent Care

## 2017-10-12 ENCOUNTER — Encounter: Payer: Self-pay | Admitting: Urgent Care

## 2017-10-12 VITALS — BP 125/77 | HR 91 | Temp 98.5°F | Resp 18

## 2017-10-12 DIAGNOSIS — F329 Major depressive disorder, single episode, unspecified: Secondary | ICD-10-CM

## 2017-10-12 DIAGNOSIS — R21 Rash and other nonspecific skin eruption: Secondary | ICD-10-CM | POA: Diagnosis not present

## 2017-10-12 NOTE — Progress Notes (Signed)
   MRN: [id number] DOB: 1965-10-25  Subjective:   Patricia Pena is a 52 y.o. female presenting for follow up on major depression.  At her last office visit on 09/15/2017, patient was started on Wellbutrin at 150 mg.  She reports that she is doing better with just this dose and is not interested in going up higher.  She still has her stressors at work and with family.  She is doing her best to cope with the loss of her mother.  He has not started any kind of behavioral therapy.  Denies suicidal ideation.  Voula has a current medication list which includes the following prescription(s): bupropion, calcium-vitamin d, cholecalciferol, co-enzyme q-10, crisaborole, cyanocobalamin, cyclobenzaprine, desoximetasone, hydrochlorothiazide, hydroxyzine, ibuprofen, lisinopril-hydrochlorothiazide, meloxicam, fish oil, and triamcinolone cream, and the following Facility-Administered Medications: methylprednisolone sodium succinate. Also is allergic to codeine.  Amabel  has a past medical history of Arthritis, Incisional hernia without mention of obstruction or gangrene, Morbid obesity ([place]), and Seizures ([place]). Also  has a past surgical history that includes Gallbladder surgery (1990); Abdominal hysterectomy (2010); Vagina surgery (2007); Breast cyst excision (1994); and left heart catheterization with coronary angiogram (N/A, 06/14/2011).  Objective:   Vitals: BP 125/77   Pulse 91   Temp 98.5 F (36.9 C) (Oral)   Resp 18   SpO2 94%   Physical Exam  Psychiatric: Her mood appears not anxious. Her affect is not blunt and not labile. Her speech is not rapid and/or pressured, not delayed, not tangential and not slurred. She is not agitated, not aggressive, not hyperactive, not slowed, not withdrawn and not combative. Cognition and memory are not impaired. She does not express impulsivity or inappropriate judgment. She exhibits a depressed mood. She expresses no homicidal and no suicidal ideation. She is  communicative.  Patient was tearful couple times during her office visit particularly when talking about losing her mother.   Assessment and Plan :   Major depressive disorder, remission status unspecified, unspecified whether recurrent  Rash and nonspecific skin eruption  Continue Wellbutrin at 150 mg.  Patient is doing better.  She will continue follow-up with a different provider at this practice or follow-up with me if she [facility].  Jaynee Eagles, PA-C [facility] [phone] 10/12/2017 3:20 PM

## 2017-10-12 NOTE — Patient Instructions (Addendum)
[facility] [address] [place], [place] [address]  Burnard Leigh [phone]  [facility] Finer [phone]  Everardo Beals [phone]   [facility] ([address] [place]) - [phone]  [facility] [place]) - [facility] - [phone]  [facility] [place] - [phone]  [facility]  - [phone] (do not file insurance)     If you have lab work done today you will be contacted with your lab results within the next 2 weeks.  If you have not heard from [place] then please contact us. The fastest way to get your results is to register for My Chart.   Jaynee Eagles, PA-C [facility] [address] [place], [place] [address]  [phone] I'll be starting in October 2019.    IF you received an x-ray today, you will receive an invoice from [facility]. Please contact [facility] at [phone] with questions or concerns regarding your invoice.   IF you received labwork today, you will receive an invoice from [facility]. Please contact [facility] at [phone] with questions or concerns regarding your invoice.   Our billing staff will not be able to assist you with questions regarding bills from these companies.  You will be contacted with the lab results as soon as they are available. The fastest way to get your results is to activate your My Chart account. Instructions are located on the last page of this paperwork. If you have not heard from [place] regarding the results in 2 weeks, please contact this office.

## 2017-11-30 DIAGNOSIS — L239 Allergic contact dermatitis, unspecified cause: Secondary | ICD-10-CM | POA: Diagnosis not present

## 2017-11-30 DIAGNOSIS — L309 Dermatitis, unspecified: Secondary | ICD-10-CM | POA: Diagnosis not present

## 2017-12-11 DIAGNOSIS — Z1211 Encounter for screening for malignant neoplasm of colon: Secondary | ICD-10-CM | POA: Diagnosis not present

## 2017-12-21 ENCOUNTER — Other Ambulatory Visit: Payer: Self-pay | Admitting: Physician Assistant

## 2017-12-21 DIAGNOSIS — M8580 Other specified disorders of bone density and structure, unspecified site: Secondary | ICD-10-CM

## 2017-12-21 DIAGNOSIS — Z1231 Encounter for screening mammogram for malignant neoplasm of breast: Secondary | ICD-10-CM

## 2017-12-30 DIAGNOSIS — F329 Major depressive disorder, single episode, unspecified: Secondary | ICD-10-CM | POA: Diagnosis not present

## 2017-12-30 DIAGNOSIS — E559 Vitamin D deficiency, unspecified: Secondary | ICD-10-CM | POA: Diagnosis not present

## 2017-12-30 DIAGNOSIS — I10 Essential (primary) hypertension: Secondary | ICD-10-CM | POA: Diagnosis not present

## 2018-01-09 DIAGNOSIS — R9431 Abnormal electrocardiogram [ECG] [EKG]: Secondary | ICD-10-CM | POA: Diagnosis not present

## 2018-01-12 DIAGNOSIS — L239 Allergic contact dermatitis, unspecified cause: Secondary | ICD-10-CM | POA: Diagnosis not present

## 2018-01-12 DIAGNOSIS — L309 Dermatitis, unspecified: Secondary | ICD-10-CM | POA: Diagnosis not present

## 2018-01-28 DIAGNOSIS — M79652 Pain in left thigh: Secondary | ICD-10-CM | POA: Diagnosis not present

## 2018-01-28 DIAGNOSIS — M25551 Pain in right hip: Secondary | ICD-10-CM | POA: Diagnosis not present

## 2018-01-28 DIAGNOSIS — M25552 Pain in left hip: Secondary | ICD-10-CM | POA: Diagnosis not present

## 2018-01-28 DIAGNOSIS — M25562 Pain in left knee: Secondary | ICD-10-CM | POA: Diagnosis not present

## 2018-02-01 DIAGNOSIS — M7989 Other specified soft tissue disorders: Secondary | ICD-10-CM | POA: Diagnosis not present

## 2018-02-01 DIAGNOSIS — M25562 Pain in left knee: Secondary | ICD-10-CM | POA: Diagnosis not present

## 2018-02-05 DIAGNOSIS — M1712 Unilateral primary osteoarthritis, left knee: Secondary | ICD-10-CM | POA: Diagnosis not present

## 2018-02-05 DIAGNOSIS — M1612 Unilateral primary osteoarthritis, left hip: Secondary | ICD-10-CM | POA: Diagnosis not present

## 2018-02-05 DIAGNOSIS — M129 Arthropathy, unspecified: Secondary | ICD-10-CM | POA: Diagnosis not present

## 2018-02-05 DIAGNOSIS — M25562 Pain in left knee: Secondary | ICD-10-CM | POA: Diagnosis not present

## 2018-02-12 DIAGNOSIS — I10 Essential (primary) hypertension: Secondary | ICD-10-CM | POA: Diagnosis not present

## 2018-02-12 DIAGNOSIS — I83813 Varicose veins of bilateral lower extremities with pain: Secondary | ICD-10-CM | POA: Diagnosis not present

## 2018-02-12 DIAGNOSIS — M25562 Pain in left knee: Secondary | ICD-10-CM | POA: Diagnosis not present

## 2018-02-12 DIAGNOSIS — E559 Vitamin D deficiency, unspecified: Secondary | ICD-10-CM | POA: Diagnosis not present

## 2018-02-14 ENCOUNTER — Other Ambulatory Visit: Payer: Self-pay | Admitting: Physician Assistant

## 2018-02-14 DIAGNOSIS — M25562 Pain in left knee: Secondary | ICD-10-CM

## 2018-02-18 ENCOUNTER — Other Ambulatory Visit: Payer: Self-pay | Admitting: Orthopedic Surgery

## 2018-02-18 DIAGNOSIS — M25562 Pain in left knee: Secondary | ICD-10-CM | POA: Diagnosis not present

## 2018-02-23 ENCOUNTER — Ambulatory Visit
Admission: RE | Admit: 2018-02-23 | Discharge: 2018-02-23 | Disposition: A | Discharge status: Home / Self Care | Payer: BLUE CROSS/BLUE SHIELD | Source: Ambulatory Visit | Attending: Orthopedic Surgery | Admitting: Orthopedic Surgery

## 2018-02-23 DIAGNOSIS — M25562 Pain in left knee: Secondary | ICD-10-CM

## 2018-02-25 DIAGNOSIS — M25562 Pain in left knee: Secondary | ICD-10-CM | POA: Diagnosis not present

## 2018-02-27 DIAGNOSIS — M6281 Muscle weakness (generalized): Secondary | ICD-10-CM | POA: Diagnosis not present

## 2018-02-27 DIAGNOSIS — S83232D Complex tear of medial meniscus, current injury, left knee, subsequent encounter: Secondary | ICD-10-CM | POA: Diagnosis not present

## 2018-02-27 DIAGNOSIS — M1712 Unilateral primary osteoarthritis, left knee: Secondary | ICD-10-CM | POA: Diagnosis not present

## 2018-02-27 DIAGNOSIS — M25661 Stiffness of right knee, not elsewhere classified: Secondary | ICD-10-CM | POA: Diagnosis not present

## 2018-03-07 DIAGNOSIS — M1712 Unilateral primary osteoarthritis, left knee: Secondary | ICD-10-CM | POA: Diagnosis not present

## 2018-03-11 ENCOUNTER — Ambulatory Visit
Admission: RE | Admit: 2018-03-11 | Discharge: 2018-03-11 | Disposition: A | Discharge status: Home / Self Care | Payer: BLUE CROSS/BLUE SHIELD | Source: Ambulatory Visit | Attending: Physician Assistant | Admitting: Physician Assistant

## 2018-03-11 DIAGNOSIS — Z1231 Encounter for screening mammogram for malignant neoplasm of breast: Secondary | ICD-10-CM

## 2018-03-11 DIAGNOSIS — M8580 Other specified disorders of bone density and structure, unspecified site: Secondary | ICD-10-CM

## 2018-03-11 DIAGNOSIS — M85852 Other specified disorders of bone density and structure, left thigh: Secondary | ICD-10-CM | POA: Diagnosis not present

## 2018-03-11 DIAGNOSIS — Z780 Asymptomatic menopausal state: Secondary | ICD-10-CM | POA: Diagnosis not present

## 2018-03-29 ENCOUNTER — Other Ambulatory Visit: Payer: Self-pay | Admitting: Physician Assistant

## 2018-03-29 DIAGNOSIS — I10 Essential (primary) hypertension: Secondary | ICD-10-CM

## 2018-04-08 DIAGNOSIS — L853 Xerosis cutis: Secondary | ICD-10-CM | POA: Diagnosis not present

## 2018-04-08 DIAGNOSIS — L309 Dermatitis, unspecified: Secondary | ICD-10-CM | POA: Diagnosis not present

## 2018-04-08 DIAGNOSIS — L239 Allergic contact dermatitis, unspecified cause: Secondary | ICD-10-CM | POA: Diagnosis not present

## 2018-04-10 DIAGNOSIS — L308 Other specified dermatitis: Secondary | ICD-10-CM | POA: Diagnosis not present

## 2018-04-23 DIAGNOSIS — L309 Dermatitis, unspecified: Secondary | ICD-10-CM | POA: Diagnosis not present

## 2018-04-23 DIAGNOSIS — L209 Atopic dermatitis, unspecified: Secondary | ICD-10-CM | POA: Diagnosis not present

## 2018-04-24 ENCOUNTER — Other Ambulatory Visit: Payer: Self-pay | Admitting: Physician Assistant

## 2018-04-24 DIAGNOSIS — I10 Essential (primary) hypertension: Secondary | ICD-10-CM

## 2018-05-14 DIAGNOSIS — Z23 Encounter for immunization: Secondary | ICD-10-CM | POA: Diagnosis not present

## 2019-07-08 ENCOUNTER — Other Ambulatory Visit: Payer: Self-pay | Admitting: Physician Assistant

## 2019-07-08 DIAGNOSIS — N644 Mastodynia: Secondary | ICD-10-CM

## 2019-07-21 ENCOUNTER — Ambulatory Visit: Admission: RE | Admit: 2019-07-21 | Payer: BLUE CROSS/BLUE SHIELD | Source: Ambulatory Visit

## 2019-07-21 ENCOUNTER — Other Ambulatory Visit: Payer: Self-pay

## 2019-07-21 ENCOUNTER — Ambulatory Visit: Payer: BLUE CROSS/BLUE SHIELD

## 2019-07-21 ENCOUNTER — Ambulatory Visit
Admission: RE | Admit: 2019-07-21 | Discharge: 2019-07-21 | Disposition: A | Discharge status: Home / Self Care | Payer: BLUE CROSS/BLUE SHIELD | Source: Ambulatory Visit | Attending: Physician Assistant | Admitting: Physician Assistant

## 2019-07-21 DIAGNOSIS — N644 Mastodynia: Secondary | ICD-10-CM

## 2021-05-13 ENCOUNTER — Ambulatory Visit (HOSPITAL_COMMUNITY)
Admission: EM | Admit: 2021-05-13 | Discharge: 2021-05-13 | Disposition: A | Discharge status: Home / Self Care | Payer: Medicaid Other | Attending: Nurse Practitioner | Admitting: Nurse Practitioner

## 2021-05-13 ENCOUNTER — Inpatient Hospital Stay (HOSPITAL_COMMUNITY)
Admission: EM | Admit: 2021-05-13 | Discharge: 2021-05-15 | DRG: 843 | Disposition: A | Discharge status: Home / Self Care | Payer: Medicaid Other | Source: Ambulatory Visit | Attending: Internal Medicine | Admitting: Internal Medicine

## 2021-05-13 ENCOUNTER — Ambulatory Visit (INDEPENDENT_AMBULATORY_CARE_PROVIDER_SITE_OTHER): Payer: Medicaid Other

## 2021-05-13 ENCOUNTER — Encounter (HOSPITAL_COMMUNITY): Payer: Self-pay

## 2021-05-13 ENCOUNTER — Emergency Department (HOSPITAL_COMMUNITY): Payer: Medicaid Other

## 2021-05-13 ENCOUNTER — Encounter (HOSPITAL_COMMUNITY): Payer: Self-pay | Admitting: Emergency Medicine

## 2021-05-13 DIAGNOSIS — R0602 Shortness of breath: Secondary | ICD-10-CM | POA: Diagnosis not present

## 2021-05-13 DIAGNOSIS — I10 Essential (primary) hypertension: Secondary | ICD-10-CM

## 2021-05-13 DIAGNOSIS — Z90710 Acquired absence of both cervix and uterus: Secondary | ICD-10-CM

## 2021-05-13 DIAGNOSIS — I119 Hypertensive heart disease without heart failure: Secondary | ICD-10-CM | POA: Diagnosis present

## 2021-05-13 DIAGNOSIS — Z803 Family history of malignant neoplasm of breast: Secondary | ICD-10-CM

## 2021-05-13 DIAGNOSIS — J90 Pleural effusion, not elsewhere classified: Secondary | ICD-10-CM | POA: Diagnosis not present

## 2021-05-13 DIAGNOSIS — Z87891 Personal history of nicotine dependence: Secondary | ICD-10-CM

## 2021-05-13 DIAGNOSIS — I493 Ventricular premature depolarization: Secondary | ICD-10-CM | POA: Diagnosis present

## 2021-05-13 DIAGNOSIS — J189 Pneumonia, unspecified organism: Secondary | ICD-10-CM | POA: Diagnosis present

## 2021-05-13 DIAGNOSIS — I491 Atrial premature depolarization: Secondary | ICD-10-CM | POA: Diagnosis present

## 2021-05-13 DIAGNOSIS — I5189 Other ill-defined heart diseases: Secondary | ICD-10-CM

## 2021-05-13 DIAGNOSIS — Z597 Insufficient social insurance and welfare support: Secondary | ICD-10-CM

## 2021-05-13 DIAGNOSIS — Z885 Allergy status to narcotic agent status: Secondary | ICD-10-CM

## 2021-05-13 DIAGNOSIS — R0609 Other forms of dyspnea: Secondary | ICD-10-CM | POA: Diagnosis not present

## 2021-05-13 DIAGNOSIS — Z79899 Other long term (current) drug therapy: Secondary | ICD-10-CM

## 2021-05-13 DIAGNOSIS — J910 Malignant pleural effusion: Secondary | ICD-10-CM | POA: Diagnosis present

## 2021-05-13 DIAGNOSIS — M1990 Unspecified osteoarthritis, unspecified site: Secondary | ICD-10-CM | POA: Diagnosis present

## 2021-05-13 DIAGNOSIS — Z6841 Body Mass Index (BMI) 40.0 and over, adult: Secondary | ICD-10-CM

## 2021-05-13 DIAGNOSIS — Z801 Family history of malignant neoplasm of trachea, bronchus and lung: Secondary | ICD-10-CM

## 2021-05-13 DIAGNOSIS — G4730 Sleep apnea, unspecified: Secondary | ICD-10-CM | POA: Diagnosis present

## 2021-05-13 DIAGNOSIS — C801 Malignant (primary) neoplasm, unspecified: Principal | ICD-10-CM | POA: Diagnosis present

## 2021-05-13 LAB — BASIC METABOLIC PANEL
Anion gap: 8 (ref 5–15)
Anion gap: 9 (ref 5–15)
BUN: 9 mg/dL (ref 6–20)
BUN: 7 mg/dL (ref 6–20)
CO2: 28 mmol/L (ref 22–32)
CO2: 25 mmol/L (ref 22–32)
Calcium: 9.3 mg/dL (ref 8.9–10.3)
Calcium: 9 mg/dL (ref 8.9–10.3)
Chloride: 104 mmol/L (ref 98–111)
Chloride: 102 mmol/L (ref 98–111)
Creatinine, Ser: 0.75 mg/dL (ref 0.44–1.00)
Creatinine, Ser: 0.76 mg/dL (ref 0.44–1.00)
GFR, Estimated: >60 mL/min (ref >60)
GFR, Estimated: >60 mL/min (ref >60)
Glucose, Bld: 127 mg/dL — ABNORMAL HIGH (ref 70–99)
Glucose, Bld: 140 mg/dL — ABNORMAL HIGH (ref 70–99)
Potassium: 3.7 mmol/L (ref 3.5–5.1)
Potassium: 3.9 mmol/L (ref 3.5–5.1)
Sodium: 140 mmol/L (ref 135–145)
Sodium: 136 mmol/L (ref 135–145)

## 2021-05-13 LAB — BRAIN NATRIURETIC PEPTIDE: B Natriuretic Peptide: 47.3 pg/mL (ref 0.0–100.0)

## 2021-05-13 LAB — CBC
HCT: 42.6 % (ref 36.0–46.0)
Hemoglobin: 14.5 g/dL (ref 12.0–15.0)
MCH: 33.2 pg (ref 26.0–34.0)
MCHC: 34 g/dL (ref 30.0–36.0)
MCV: 97.5 fL (ref 80.0–100.0)
Platelets: 267 10*3/uL (ref 150–400)
RBC: 4.37 MIL/uL (ref 3.87–5.11)
RDW: 12 % (ref 11.5–15.5)
WBC: 9.8 10*3/uL (ref 4.0–10.5)
nRBC: 0 % (ref 0.0–0.2)

## 2021-05-13 LAB — D-DIMER, QUANTITATIVE: D-Dimer, Quant: 0.58 ug/mL-FEU — ABNORMAL HIGH (ref 0.00–0.50)

## 2021-05-13 MED ORDER — IPRATROPIUM-ALBUTEROL 0.5-2.5 (3) MG/3ML IN SOLN: 3.0000 mL | Freq: Once | RESPIRATORY_TRACT | Status: DC

## 2021-05-13 MED ORDER — ALBUTEROL SULFATE (2.5 MG/3ML) 0.083% IN NEBU
2.5000 mg | INHALATION_SOLUTION | Freq: Once | RESPIRATORY_TRACT | Status: AC
  Administered 2021-05-13: 2.5 mg via RESPIRATORY_TRACT

## 2021-05-13 MED ORDER — ALBUTEROL SULFATE (2.5 MG/3ML) 0.083% IN NEBU
INHALATION_SOLUTION | RESPIRATORY_TRACT | Status: AC
  Filled 2021-05-13: qty 3

## 2021-05-13 MED ORDER — LISINOPRIL 20 MG PO TABS
20.0000 mg | ORAL_TABLET | Freq: Every day | ORAL | Status: DC
  Filled 2021-05-13: qty 1

## 2021-05-13 MED ORDER — HYDROCHLOROTHIAZIDE 25 MG PO TABS
25.0000 mg | ORAL_TABLET | Freq: Every day | ORAL | Status: DC
  Filled 2021-05-13: qty 1

## 2021-05-13 MED ORDER — SODIUM CHLORIDE 0.9 % IV SOLN
3.0000 g | Freq: Four times a day (QID) | INTRAVENOUS | Status: DC
  Administered 2021-05-14 – 2021-05-15 (×6): 3 g via INTRAVENOUS
  Filled 2021-05-13 (×8): qty 8

## 2021-05-13 MED ORDER — IOHEXOL 350 MG/ML SOLN
100.0000 mL | Freq: Once | INTRAVENOUS | Status: AC | PRN
  Administered 2021-05-13: 100 mL via INTRAVENOUS

## 2021-05-13 MED ORDER — LISINOPRIL-HYDROCHLOROTHIAZIDE 20-25 MG PO TABS: 1.0000 | ORAL_TABLET | Freq: Every day | ORAL | Status: DC

## 2021-05-13 NOTE — Progress Notes (Signed)
Pharmacy Antibiotic Note ? ?HETAL Patricia Pena is a 56 y.o. female for which pharmacy has been consulted for unasyn dosing for  aspiration pneumonia . ? ?SCr 0.75 - at baseline ?WBC 9.8; T 99 F; HR 98>90; RR 20>18 ? ?Plan: ?Unasyn 3g q6h ?Trend WBC, Fever, Renal function, & Clinical course ?F/u cultures, clinical course, WBC, fever ?De-escalate when able ? ?  ? ?Temp (24hrs), Avg:98.8 ?F (37.1 ?C), Min:98.6 ?F (37 ?C), Max:99 ?F (37.2 ?C) ? ?Recent Labs  ?Lab 05/13/21 ?1644  ?WBC 9.8  ?CREATININE 0.75  ?  ?CrCl cannot be calculated (Unknown ideal weight.).   ? ?Allergies  ?Allergen Reactions  ? Codeine Other (See Comments)  ?  headache  ? ? ?Antimicrobials this admission: ?unasyn 4/7 >>  ? ?Microbiology results: ?Pending ? ?Thank you for allowing pharmacy to be a part of this patient?s care. ? ?Lorelei Pont, PharmD, BCPS ?05/13/2021 10:38 PM ?ED Clinical Pharmacist -  [phone] ?  ?

## 2021-05-13 NOTE — ED Provider Triage Note (Signed)
Emergency Medicine Provider Triage Evaluation Note ? ?Lowella Curb Mchargue , a 56 y.o. female  was evaluated in triage.  Pt complains of SOB for the past year. Denies any leg swelling. She reports a low pulse usually. Recent trip to [place] when she flew. Now tachycardia.  ? ?Sent over from [facility] for large pleural effusion seen on the left.  ? ?Review of Systems  ?Positive: SOB ?Negative: Chest pain, leg swelling ? ?Physical Exam  ?BP (!) 132/106 (BP Location: Right Arm)   Pulse (!) 111   Temp 98.6 ?F (37 ?C) (Oral)   Resp (!) 24   SpO2 93%  ?Gen:   Awake, no distress   ?Resp:  Normal effort, diminished breath sounds on the left.   ?MSK:   Moves extremities without difficulty  ?Other:  Speaking in full sentences with ease.  ? ?Medical Decision Making  ?Medically screening exam initiated at 5:18 PM.  Appropriate orders placed.  Verbie Babic Burdick was informed that the remainder of the evaluation will be completed by another provider, this initial triage assessment does not replace that evaluation, and the importance of remaining in the ED until their evaluation is complete. ? ?Basic labs as well as d-dimer and BNP placed.  ?  ?Sherrell Puller, PA-C ?05/13/21 1723 ? ?

## 2021-05-13 NOTE — ED Provider Notes (Signed)
?[facility] ? ? ? ?CSN: [id number] ?Arrival date & time: 05/13/21  1313 ? ? ?  ? ?History   ?Chief Complaint ?Chief Complaint  ?Patient presents with  ? Shortness of Breath  ? ? ?HPI ?Patricia Pena is a 56 y.o. female.  ? ?Patient reports shortness of breath ongoing for more than 1 year.  She reports she recently traveled to [place] and has since really struggled to catch her breath with walking.  She reports that she has also noticed her blood pressure and heart rate have been higher than they normally are at home.  She denies fevers, unintentional weight loss, wheezing, chest pain.  She does report her chest is tight.  She reports occasional cough that is mostly dry.  She used to smoke years ago and has quit smoking twice.  She does not vape or use e-cigarettes.   ? ?She has a primary care provider, has not been given a prescription for the shortness of breath.  She has a history of essential hypertension and is reportedly compliant with her medication.  She is currently without insurance and has not been able to see her primary care provider or specialists in a few months.  ? ? ?Past Medical History:  ?Diagnosis Date  ? Arthritis   ? Incisional hernia without mention of obstruction or gangrene   ? Morbid obesity ([place])   ? Seizures ([place])   ? last one was in 1985  ? ? ?Patient Active Problem List  ? Diagnosis Date Noted  ? Essential hypertension 12/29/2015  ? Arthritis   ? History of seizures   ? Morbid obesity ([place]) 02/10/2011  ? Incisional hernia without mention of obstruction or gangrene 02/10/2011  ? ? ?Past Surgical History:  ?Procedure Laterality Date  ? ABDOMINAL HYSTERECTOMY  2010  ? at [facility]  ? BREAST CYST ASPIRATION Right 2010  ? BREAST CYST EXCISION  1994  ? right breast  ? BREAST EXCISIONAL BIOPSY Right   ? benign cyst  ? [place]  ? LEFT HEART CATHETERIZATION WITH CORONARY ANGIOGRAM N/A 06/14/2011  ? Procedure: LEFT HEART CATHETERIZATION WITH CORONARY ANGIOGRAM;  Surgeon:  Candee Furbish, MD;  Location: [facility] CATH LAB;  Service: Cardiovascular;  Laterality: N/A;  ? VAGINA SURGERY  2007  ? ? ?OB History   ?No obstetric history on file. ?  ? ? ? ?Home Medications   ? ?Prior to Admission medications   ?Medication Sig Start Date End Date Taking? Authorizing Provider  ?CALCIUM-VITAMIN D PO Take 2,000 mg by mouth daily.     [provider]  ?cholecalciferol (VITAMIN D) 1000 units tablet Take 1,000 Units by mouth daily.    [provider]  ?co-enzyme Q-10 50 MG capsule Take 50 mg by mouth daily.    [provider]  ?Crisaborole (EUCRISA) 2 % OINT Apply 1 Dose topically 2 (two) times daily. Apply thin layer to the face twice daily. 03/21/17   Tereasa Coop, PA-C  ?Cyanocobalamin (VITAMIN B-12 PO) Take by mouth.    [provider]  ?Desoximetasone 0.05 % OINT APPLY 1 APPLICATION TO FACE TWICE DAILY FOR 2 WEEKS 04/07/17   [provider]  ?hydrOXYzine (ATARAX/VISTARIL) 25 MG tablet Take 1 tablet (25 mg total) by mouth every 6 (six) hours as needed for itching. 04/05/17   Ripley Fraise, MD  ?ibuprofen (ADVIL,MOTRIN) 200 MG tablet Take 800 mg by mouth every 6 (six) hours as needed. For pain    [provider]  ?  lisinopril-hydrochlorothiazide (PRINZIDE,ZESTORETIC) 20-25 MG tablet TAKE 1 TABLET BY MOUTH EVERY DAY 04/24/18   Jacelyn Pi, Lilia Argue, MD  ?meloxicam (MOBIC) 7.5 MG tablet Take 1 tablet (7.5 mg total) by mouth 2 (two) times daily. 05/22/17   Tereasa Coop, PA-C  ?Omega-3 Fatty Acids (FISH OIL) 1000 MG CAPS Take by mouth.    [provider]  ?triamcinolone cream (KENALOG) 0.1 % Apply 1 application topically 2 (two) times daily. 09/15/17   Jaynee Eagles, PA-C  ? ? ?Family History ?Family History  ?Problem Relation Age of Onset  ? Cancer Father   ?     lung  ? Breast cancer Paternal Aunt   ? ? ?Social History ?Social History  ? ?Tobacco Use  ? Smoking status: Former  ?  Packs/day: 1.50  ?  Years: 35.00  ?  Pack years: 52.50  ?  Types:  Cigarettes  ?  Quit date: 12/28/2009  ?  Years since quitting: 11.3  ? Smokeless tobacco: Never  ?Vaping Use  ? Vaping Use: Never used  ?Substance Use Topics  ? Alcohol use: Yes  ?  Comment: occasionally  ? Drug use: No  ? ? ? ?Allergies   ?Codeine ? ? ?Review of Systems ?Review of Systems ?Per HPI ? ?Physical Exam ?Triage Vital Signs ?ED Triage Vitals  ?Enc Vitals Group  ?   BP 05/13/21 1443 (!) 197/80  ?   Pulse Rate 05/13/21 1443 98  ?   Resp 05/13/21 1443 20  ?   Temp 05/13/21 1443 99 ?F (37.2 ?C)  ?   Temp Source 05/13/21 1443 Oral  ?   SpO2 05/13/21 1443 94 %  ?   Weight --   ?   Height --   ?   Head Circumference --   ?   Peak Flow --   ?   Pain Score 05/13/21 1445 0  ?   Pain Loc --   ?   Pain Edu? --   ?   Excl. in [place]? --   ? ?No data found. ? ?Updated Vital Signs ?BP (!) 197/80 (BP Location: Left Arm)   Pulse 98   Temp 99 ?F (37.2 ?C) (Oral)   Resp 20   SpO2 94%  ? ?Visual Acuity ?Right Eye Distance:   ?Left Eye Distance:   ?Bilateral Distance:   ? ?Right Eye Near:   ?Left Eye Near:    ?Bilateral Near:    ? ?Physical Exam ?Vitals and nursing note reviewed.  ?Constitutional:   ?   General: She is not in acute distress. ?   Appearance: She is well-developed. She is obese. She is not toxic-appearing.  ?HENT:  ?   Head: Normocephalic and atraumatic.  ?   Mouth/Throat:  ?   Mouth: Mucous membranes are moist.  ?   Pharynx: No pharyngeal swelling.  ?Eyes:  ?   Extraocular Movements: Extraocular movements intact.  ?Cardiovascular:  ?   Rate and Rhythm: Normal rate and regular rhythm.  ?Pulmonary:  ?   Effort: Pulmonary effort is normal. No tachypnea.  ?   Breath sounds: Examination of the right-upper field reveals decreased breath sounds. Examination of the left-upper field reveals decreased breath sounds. Examination of the right-middle field reveals decreased breath sounds. Examination of the left-middle field reveals decreased breath sounds. Examination of the right-lower field reveals decreased breath  sounds. Examination of the left-lower field reveals decreased breath sounds. Decreased breath sounds present.  ?Skin: ?   General: Skin is warm  and dry.  ?   Coloration: Skin is not cyanotic or pale.  ?   Findings: No erythema.  ?Neurological:  ?   Mental Status: She is alert and oriented to person, place, and time.  ?   Motor: No weakness.  ? ? ? ?UC Treatments / Results  ?Labs ?(all labs ordered are listed, but only abnormal results are displayed) ?Labs Reviewed - No data to display ? ?EKG ? ? ?Radiology ?DG Chest 2 View ? ?Result Date: 05/13/2021 ?CLINICAL DATA:  Shortness of breath EXAM: CHEST - 2 VIEW COMPARISON:  08/20/2017 FINDINGS: Cardiomegaly. Large left pleural effusion associated atelectasis or consolidation. The right lung is normally aerated. Disc degenerative disease of the thoracic spine. IMPRESSION: 1. Large left pleural and effusion associated atelectasis or consolidation. 2. Cardiomegaly. Electronically Signed   By: Delanna Ahmadi M.D.   On: 05/13/2021 15:43   ? ?Procedures ?Procedures (including critical care time) ? ?Medications Ordered in UC ?Medications  ?albuterol (PROVENTIL) (2.5 MG/3ML) 0.083% nebulizer solution 2.5 mg (2.5 mg Nebulization Given 05/13/21 1516)  ? ? ?Initial Impression / Assessment and Plan / UC Course  ?I have reviewed the triage vital signs and the nursing notes. ? ?Pertinent labs & imaging results that were available during my care of the patient were reviewed by me and considered in my medical decision making (see chart for details). ? ?  ?Albuterol breathing treatment given with minimal relief in shortness of breath.  Chest x-ray today shows large left pleural effusion.  This was not present on chest x-ray from 2019.  After reviewing with patient, she stated her mother died after having pleural effusions.  I recommend patient go to the Emergency Room to have the pleural effusion evaluated and managed.  The patient was given the opportunity to ask questions.  All questions  answered to their satisfaction.  The patient is in agreement to this plan.  ? ?Final Clinical Impressions(s) / UC Diagnoses  ? ?Final diagnoses:  ?Dyspnea on exertion  ?Pleural effusion on left  ? ? ? ?Dischar

## 2021-05-13 NOTE — Assessment & Plan Note (Addendum)
Appears to be exudative in nature. ?Meet lights criteria for exudative effusion. ?Concerning serosanguineous appearance. ?We will follow-up on pleural fluid cultures as well as cytology. ?We will perform a decubitus view x-ray tomorrow to see if the patient will benefit from repeat thoracentesis. ?Procalcitonin level is negative thus making severe pneumonia causing parapneumonic effusion with very less likely. ?ESR is minimally elevated, CRP is next to normal, all other inflammatory work-up is still pending.  BNP is not significantly elevated.  Echocardiogram shows preserved EF.  CT PE protocol is negative for PE. ?At present malignant pleural effusion is high in differential. ?Continue with IV antibiotics for now as there is concern for multifocal pneumonia.   ?

## 2021-05-13 NOTE — H&P (Signed)
?History and Physical  ? ? ?Patient: Patricia Pena WUJ:[id number] DOB: Jun 12, 1965 ?DOA: 05/13/2021 ?DOS: the patient was seen and examined on 05/13/2021 ?PCP: [facility] [place]  ?Patient coming from: Home ? ?Chief Complaint:  ?Chief Complaint  ?Patient presents with  ? Shortness of Breath  ? ?HPI: Patricia Pena is a 56 y.o. female with medical history significant of HTN, arthritis and morbid obesity who presents with large left pleural effusion.  ? ?She has been having dyspnea on exertion for the past year. Could barely walk anywhere without shortness of breath.  Also has frequent cough sometimes productive of sputum.  Denies any lower extremity edema. No orthopnea. No prolonged travel. Previous tobacco use but quit about 5 years ago. No fever or night sweats. No weight loss.  ?She underwent a myocardial perfusion stress test in 05/03/2020 and brought in records documenting resting EKG at that time showing occasional unifocal PVCs, asymmetric T wave inversions in 2 3, aVF as well as V3 through V6.  During stress test there was no significant ST segment changes seen.  Overall it was reported as a poor quality study due to considerable movement with EF of 54.  No evidence of fixed or reversible defects. ? ?Presented with these persistent symptoms of dyspnea at urgent care today and had chest x-ray showing large left pleural effusion and was advised to present to the ED. ? ?In the ED, she was afebrile, initially tachycardic with rates of 110 and hypertensive with BP of 130/106 on room air.  No leukocytosis or anemia.  BMP was unremarkable.  Elevated D-dimer up to 0.58. ? ?CTA chest showed no central pulmonary embolism with small peripheral branches limited by motion artifact and infiltrate.  Large serious left pleural effusion with infiltrate in the left upper and lower lobe suggestive of multifocal pneumonia. ?Review of Systems: As mentioned in the history of present illness. All other systems reviewed and are  negative. ?Past Medical History:  ?Diagnosis Date  ? Arthritis   ? Incisional hernia without mention of obstruction or gangrene   ? Morbid obesity ([place])   ? Seizures ([place])   ? last one was in 1985  ? ?Past Surgical History:  ?Procedure Laterality Date  ? ABDOMINAL HYSTERECTOMY  2010  ? at [facility]  ? BREAST CYST ASPIRATION Right 2010  ? BREAST CYST EXCISION  1994  ? right breast  ? BREAST EXCISIONAL BIOPSY Right   ? benign cyst  ? [place]  ? LEFT HEART CATHETERIZATION WITH CORONARY ANGIOGRAM N/A 06/14/2011  ? Procedure: LEFT HEART CATHETERIZATION WITH CORONARY ANGIOGRAM;  Surgeon: Candee Furbish, MD;  Location: [facility] CATH LAB;  Service: Cardiovascular;  Laterality: N/A;  ? VAGINA SURGERY  2007  ? ?Social History:  reports that she quit smoking about 11 years ago. She has a 52.50 pack-year smoking history. She has never used smokeless tobacco. She reports current alcohol use. She reports that she does not use drugs. ? ?Allergies  ?Allergen Reactions  ? Codeine Other (See Comments)  ?  headache  ? ? ?Family History  ?Problem Relation Age of Onset  ? Cancer Father   ?     lung  ? Breast cancer Paternal Aunt   ? ? ?Prior to Admission medications   ?Medication Sig Start Date End Date Taking? Authorizing Provider  ?CALCIUM-VITAMIN D PO Take 2,000 mg by mouth daily.    Yes [provider]  ?cholecalciferol (VITAMIN D) 1000 units tablet Take 1,000 Units by mouth daily.  Yes [provider]  ?co-enzyme Q-10 50 MG capsule Take 50 mg by mouth daily.   Yes [provider]  ?Cyanocobalamin (VITAMIN B-12 PO) Take 1 tablet by mouth daily.   Yes [provider]  ?Desoximetasone 0.05 % OINT Apply 1 application. topically daily as needed (For skin rash). 04/07/17  Yes [provider]  ?lisinopril-hydrochlorothiazide (PRINZIDE,ZESTORETIC) 20-25 MG tablet TAKE 1 TABLET BY MOUTH EVERY DAY ?Patient taking differently: Take 1 tablet by mouth daily. 04/24/18  Yes Jacelyn Pi, Lilia Argue, MD   ?Crisaborole (EUCRISA) 2 % OINT Apply 1 Dose topically 2 (two) times daily. Apply thin layer to the face twice daily. ?Patient not taking: Reported on 05/13/2021 03/21/17   Tereasa Coop, PA-C  ?hydrOXYzine (ATARAX/VISTARIL) 25 MG tablet Take 1 tablet (25 mg total) by mouth every 6 (six) hours as needed for itching. ?Patient not taking: Reported on 05/13/2021 04/05/17   Ripley Fraise, MD  ?meloxicam (MOBIC) 7.5 MG tablet Take 1 tablet (7.5 mg total) by mouth 2 (two) times daily. ?Patient not taking: Reported on 05/13/2021 05/22/17   Tereasa Coop, PA-C  ?triamcinolone cream (KENALOG) 0.1 % Apply 1 application topically 2 (two) times daily. ?Patient not taking: Reported on 05/13/2021 09/15/17   Jaynee Eagles, PA-C  ? ? ?Physical Exam: ?Vitals:  ? 05/13/21 1635 05/13/21 1929 05/13/21 2035 05/13/21 2200  ?BP: (!) 132/106 121/62 110/74 126/77  ?Pulse: (!) 111 96 90 95  ?Resp: (!) 24 18 18 20   ?Temp: 98.6 ?F (37 ?C)   98.3 ?F (36.8 ?C)  ?TempSrc: Oral   Oral  ?SpO2: 93% 98% 96% 97%  ? ?Constitutional: NAD, calm, comfortable, morbidly obese female sitting upright in bed eyes: PERRL, lids and conjunctivae normal ?ENMT: Mucous membranes are moist.  ?Neck: normal, supple ?Respiratory: clear to auscultation bilaterally, with diminished lung sounds to left lower lobe.  Normal respiratory effort on room air. No accessory muscle use.  Able to speak in full sentences. ?Cardiovascular: Regular rate and rhythm, no murmurs / rubs / gallops. No extremity edema.  ?Abdomen: Soft, nontender, no tenderness,  Bowel sounds positive.  ?Musculoskeletal: no clubbing / cyanosis. No joint deformity upper and lower extremities. Good ROM, no contractures. Normal muscle tone.  ?Skin: no rashes, lesions, ulcers. No induration ?Neurologic: CN 2-12 grossly intact.  Strength 5/5 in all 4.  ?Psychiatric: Normal judgment and insight. Alert and oriented x 3. Normal mood. ?Data Reviewed: ? ?See HPI ? ?Assessment and Plan: ?* Pleural effusion ?Hx of exertional  dyspnea for a yr with negative myocardial perfusion stress test in 2022 but was deemed a poor quality study. Still concerning for cardiac etiology and now a possible parapneumonia effusion ?-obtain thoracentesis with fluid study ?-start IV unasyn pending cultures ?-check TSH and repeat echocardiogram ? ?Essential hypertension ?Continue home Lisinopril-HCTZ  ? ?Morbid obesity ([place]) ?Encourage weight loss ? ? ? ? ? Advance Care Planning:   Code Status: Full Code  ? ?Consults: none ? ?Family Communication: No family present ? ?Severity of Illness: ?The appropriate patient status for this patient is OBSERVATION. Observation status is judged to be reasonable and necessary in order to provide the required intensity of service to ensure the patient's safety. The patient's presenting symptoms, physical exam findings, and initial radiographic and laboratory data in the context of their medical condition is felt to place them at decreased risk for further clinical deterioration. Furthermore, it is anticipated that the patient will be medically stable for discharge from the hospital within 2 midnights of  admission.  ? ?Author: Orene Desanctis, DO ?05/13/2021 11:04 PM ? ?For on call review [email or web address].  ?

## 2021-05-13 NOTE — Assessment & Plan Note (Addendum)
BMI more than 40. ?Placing the patient at high risk of poor outcome. ?Patient also appears to have symptoms of sleep apnea and will require outpatient sleep study. ?We will arrange for outpatient PCP for the patient given her lack of insurance. ?

## 2021-05-13 NOTE — ED Triage Notes (Addendum)
Patient here with complaint of shortness of breath that has gotten progressively worse over the last year. Patient states she received a breathing treatment earlier today which helped temporarily. Patient denies pain. Patient is alert, oriented, speaking in complete sentences, and is in no apparent distress at this time. ?

## 2021-05-13 NOTE — Discharge Instructions (Addendum)
Please go directly to the Emergency Room for further evaluation of the fluid on your left lung ?

## 2021-05-13 NOTE — Assessment & Plan Note (Addendum)
Continue home Lisinopril ?hold-HCTZ  ?

## 2021-05-13 NOTE — ED Notes (Signed)
RN introduced self to pt. Pt ambulated in room with no distress. Pt hooked up to cardiac monitor, warm blanket given. Plan of care discussed with pt.  ?

## 2021-05-13 NOTE — Progress Notes (Signed)
Pt. Arrived via ER stretcher. VSS. GCS-15. CCMD notified. Call bell with in reach. All questions answered ?

## 2021-05-13 NOTE — ED Triage Notes (Signed)
Pt c/o SOB on exertion x1 year states getting worse. Pt appears anxious, speaking in complete sentence.  ?

## 2021-05-13 NOTE — ED Notes (Signed)
Patient is being discharged from the Urgent Care and sent to the Emergency Department via POV . Per Janett Billow NP, patient is in need of higher level of care due to pleural effusion. Patient is aware and verbalizes understanding of plan of care.  ?Vitals:  ? 05/13/21 1443  ?BP: (!) 197/80  ?Pulse: 98  ?Resp: 20  ?Temp: 99 ?F (37.2 ?C)  ?SpO2: 94%  ?  ?

## 2021-05-13 NOTE — ED Notes (Signed)
Pt back from CT and connected back to the cardiac monitor. ?

## 2021-05-14 ENCOUNTER — Observation Stay (HOSPITAL_COMMUNITY): Payer: Medicaid Other

## 2021-05-14 DIAGNOSIS — C801 Malignant (primary) neoplasm, unspecified: Secondary | ICD-10-CM | POA: Diagnosis present

## 2021-05-14 DIAGNOSIS — G4730 Sleep apnea, unspecified: Secondary | ICD-10-CM | POA: Diagnosis present

## 2021-05-14 DIAGNOSIS — M1990 Unspecified osteoarthritis, unspecified site: Secondary | ICD-10-CM | POA: Diagnosis present

## 2021-05-14 DIAGNOSIS — I493 Ventricular premature depolarization: Secondary | ICD-10-CM | POA: Diagnosis present

## 2021-05-14 DIAGNOSIS — R0609 Other forms of dyspnea: Secondary | ICD-10-CM

## 2021-05-14 DIAGNOSIS — Z6841 Body Mass Index (BMI) 40.0 and over, adult: Secondary | ICD-10-CM | POA: Diagnosis not present

## 2021-05-14 DIAGNOSIS — C3490 Malignant neoplasm of unspecified part of unspecified bronchus or lung: Secondary | ICD-10-CM | POA: Diagnosis not present

## 2021-05-14 DIAGNOSIS — J910 Malignant pleural effusion: Secondary | ICD-10-CM | POA: Diagnosis present

## 2021-05-14 DIAGNOSIS — Z803 Family history of malignant neoplasm of breast: Secondary | ICD-10-CM | POA: Diagnosis not present

## 2021-05-14 DIAGNOSIS — Z801 Family history of malignant neoplasm of trachea, bronchus and lung: Secondary | ICD-10-CM | POA: Diagnosis not present

## 2021-05-14 DIAGNOSIS — J189 Pneumonia, unspecified organism: Secondary | ICD-10-CM | POA: Diagnosis present

## 2021-05-14 DIAGNOSIS — Z885 Allergy status to narcotic agent status: Secondary | ICD-10-CM | POA: Diagnosis not present

## 2021-05-14 DIAGNOSIS — Z90710 Acquired absence of both cervix and uterus: Secondary | ICD-10-CM | POA: Diagnosis not present

## 2021-05-14 DIAGNOSIS — Z597 Insufficient social insurance and welfare support: Secondary | ICD-10-CM | POA: Diagnosis not present

## 2021-05-14 DIAGNOSIS — J90 Pleural effusion, not elsewhere classified: Secondary | ICD-10-CM | POA: Diagnosis not present

## 2021-05-14 DIAGNOSIS — Z87891 Personal history of nicotine dependence: Secondary | ICD-10-CM | POA: Diagnosis not present

## 2021-05-14 DIAGNOSIS — Z79899 Other long term (current) drug therapy: Secondary | ICD-10-CM | POA: Diagnosis not present

## 2021-05-14 DIAGNOSIS — I5189 Other ill-defined heart diseases: Secondary | ICD-10-CM

## 2021-05-14 DIAGNOSIS — I119 Hypertensive heart disease without heart failure: Secondary | ICD-10-CM | POA: Diagnosis present

## 2021-05-14 DIAGNOSIS — I491 Atrial premature depolarization: Secondary | ICD-10-CM | POA: Diagnosis present

## 2021-05-14 LAB — LACTATE DEHYDROGENASE, PLEURAL OR PERITONEAL FLUID: LD, Fluid: 200 U/L — ABNORMAL HIGH (ref 3–23)

## 2021-05-14 LAB — ALBUMIN, PLEURAL OR PERITONEAL FLUID: Albumin, Fluid: 2.3 g/dL

## 2021-05-14 LAB — PROTEIN, PLEURAL OR PERITONEAL FLUID: Total protein, fluid: 3.7 g/dL

## 2021-05-14 LAB — HIV ANTIBODY (ROUTINE TESTING W REFLEX): HIV Screen 4th Generation wRfx: NONREACTIVE

## 2021-05-14 LAB — LACTATE DEHYDROGENASE: LDH: 190 U/L (ref 98–192)

## 2021-05-14 LAB — BASIC METABOLIC PANEL
Anion gap: 8 (ref 5–15)
BUN: 8 mg/dL (ref 6–20)
CO2: 25 mmol/L (ref 22–32)
Calcium: 9.2 mg/dL (ref 8.9–10.3)
Chloride: 105 mmol/L (ref 98–111)
Creatinine, Ser: 0.74 mg/dL (ref 0.44–1.00)
GFR, Estimated: >60 mL/min (ref >60)
Glucose, Bld: 97 mg/dL (ref 70–99)
Potassium: 3.9 mmol/L (ref 3.5–5.1)
Sodium: 138 mmol/L (ref 135–145)

## 2021-05-14 LAB — ECHOCARDIOGRAM COMPLETE: S' Lateral: 2.9 cm

## 2021-05-14 LAB — TSH: TSH: 0.485 u[IU]/mL (ref 0.350–4.500)

## 2021-05-14 LAB — C-REACTIVE PROTEIN: CRP: 1.1 mg/dL — ABNORMAL HIGH (ref <1.0)

## 2021-05-14 LAB — GLUCOSE, PLEURAL OR PERITONEAL FLUID: Glucose, Fluid: 150 mg/dL

## 2021-05-14 LAB — GRAM STAIN: Gram Stain: NONE SEEN

## 2021-05-14 LAB — SEDIMENTATION RATE: Sed Rate: 45 mm/hr — ABNORMAL HIGH (ref 0–22)

## 2021-05-14 LAB — PROCALCITONIN: Procalcitonin: <0.1 ng/mL

## 2021-05-14 MED ORDER — LIDOCAINE HCL (PF) 1 % IJ SOLN
INTRAMUSCULAR | Status: AC
  Filled 2021-05-14: qty 30

## 2021-05-14 MED ORDER — ACETAMINOPHEN 650 MG RE SUPP
650.0000 mg | Freq: Four times a day (QID) | RECTAL | Status: DC | PRN
Start: 2021-05-14 — End: 2021-05-15

## 2021-05-14 MED ORDER — ONDANSETRON HCL 4 MG/2ML IJ SOLN
4.0000 mg | Freq: Four times a day (QID) | INTRAMUSCULAR | Status: DC | PRN
Start: 2021-05-14 — End: 2021-05-15

## 2021-05-14 MED ORDER — HYDROCODONE-ACETAMINOPHEN 5-325 MG PO TABS
1.0000 | ORAL_TABLET | Freq: Four times a day (QID) | ORAL | Status: DC | PRN
  Administered 2021-05-14: 1 via ORAL
  Filled 2021-05-14: qty 1

## 2021-05-14 MED ORDER — LOPERAMIDE HCL 2 MG PO CAPS
2.0000 mg | ORAL_CAPSULE | ORAL | Status: DC | PRN
  Administered 2021-05-14: 2 mg via ORAL
  Filled 2021-05-14: qty 1

## 2021-05-14 MED ORDER — SACCHAROMYCES BOULARDII 250 MG PO CAPS
250.0000 mg | ORAL_CAPSULE | Freq: Two times a day (BID) | ORAL | Status: DC
Start: 2021-05-14 — End: 2021-05-15
  Administered 2021-05-14 – 2021-05-15 (×3): 250 mg via ORAL
  Filled 2021-05-14 (×3): qty 1

## 2021-05-14 MED ORDER — LISINOPRIL 20 MG PO TABS
20.0000 mg | ORAL_TABLET | Freq: Every day | ORAL | Status: DC
  Administered 2021-05-14 – 2021-05-15 (×2): 20 mg via ORAL
  Filled 2021-05-14: qty 1

## 2021-05-14 MED ORDER — SENNOSIDES-DOCUSATE SODIUM 8.6-50 MG PO TABS: 1.0000 | ORAL_TABLET | Freq: Every evening | ORAL | Status: DC | PRN

## 2021-05-14 MED ORDER — ACETAMINOPHEN 325 MG PO TABS: 650.0000 mg | ORAL_TABLET | Freq: Four times a day (QID) | ORAL | Status: DC | PRN

## 2021-05-14 MED ORDER — ONDANSETRON HCL 4 MG PO TABS: 4.0000 mg | ORAL_TABLET | Freq: Four times a day (QID) | ORAL | Status: DC | PRN

## 2021-05-14 NOTE — Procedures (Signed)
PROCEDURE SUMMARY: ? ?Successful image-guided left thoracentesis. ?Yielded 1.2 liters of serosanguineous fluid. ?Patient tolerated procedure well. ?EBL < 1 mL ? ?No immediate complications. ? ?Specimen was sent for labs. ?Post procedure CXR pending. ? ?Please see imaging section of Epic for full dictation. ? ?Joaquim Nam PA-C ?05/14/2021 ?12:28 PM ? ? ? ?

## 2021-05-14 NOTE — Assessment & Plan Note (Signed)
Most likely this is in the setting of undiagnosed OSA. ?Recommend patient to have outpatient further work-up. ?

## 2021-05-14 NOTE — Progress Notes (Signed)
Patient off floor to ultrasound.

## 2021-05-14 NOTE — Assessment & Plan Note (Signed)
Given patient's presentation with pleural effusion as well as frequent PACs on telemetry and echocardiogram was performed. ?Preserved EF. ?At present on lisinopril which I will continue.  If the PVC frequency increases will add beta-blocker. ?

## 2021-05-14 NOTE — Progress Notes (Signed)
?  Progress Note ?Patient: Patricia Pena [id number]:[id number] DOB: [birth year] DOA: 05/13/2021  ?DOS: the patient was seen and examined on 05/14/2021 ? ?Brief hospital course: ?Patricia Pena is a 56 y.o. female with medical history significant of HTN, arthritis and morbid obesity who presents with large left pleural effusion.  ?Found to have large exudative pleural effusion SP thoracentesis. ? ?Assessment and Plan: ?* Pleural effusion ?Appears to be exudative in nature. ?Meet lights criteria for exudative effusion. ?Concerning serosanguineous appearance. ?We will follow-up on pleural fluid cultures as well as cytology. ?We will perform a decubitus view x-ray tomorrow to see if the patient will benefit from repeat thoracentesis. ?Procalcitonin level is negative thus making severe pneumonia causing parapneumonic effusion with very less likely. ?ESR is minimally elevated, CRP is next to normal, all other inflammatory work-up is still pending.  BNP is not significantly elevated.  Echocardiogram shows preserved EF.  CT PE protocol is negative for PE. ?At present malignant pleural effusion is high in differential. ?Continue with IV antibiotics for now as there is concern for multifocal pneumonia.   ? ?Morbid obesity ([place]) ?BMI more than 40. ?Placing the patient at high risk of poor outcome. ?Patient also appears to have symptoms of sleep apnea and will require outpatient sleep study. ?We will arrange for outpatient PCP for the patient given her lack of insurance. ? ?Essential hypertension ?Continue home Lisinopril ?hold-HCTZ  ? ?Chronic systolic dysfunction of right ventricle ?Most likely this is in the setting of undiagnosed OSA. ?Recommend patient to have outpatient further work-up. ? ?Frequent PVCs ?Given patient's presentation with pleural effusion as well as frequent PACs on telemetry and echocardiogram was performed. ?Preserved EF. ?At present on lisinopril which I will continue.  If the PVC frequency increases will add  beta-blocker. ? ?Subjective: Breathing improving.  Still has some dry cough.  No nausea no vomiting. ? ?Physical Exam: ?Vitals:  ? 05/14/21 1215 05/14/21 1300 05/14/21 1618 05/14/21 2004  ?BP: (!) 136/91 134/87 136/79 129/80  ?Pulse:  96 (!) 101 95  ?Resp:  (!) $Re'21 19 20  'Xgr$ ?Temp:   98.2 ?F (36.8 ?C) 98.1 ?F (36.7 ?C)  ?TempSrc:   Oral Oral  ?SpO2:  93% 98% 96%  ? ?General: Appear in mild distress; no visible Abnormal Neck Mass Or lumps, Conjunctiva normal ?Cardiovascular: S1 and S2 Present, no Murmur, ?Respiratory: good respiratory effort, Bilateral Air entry present and bilateral Crackles, no wheezes ?Abdomen: Bowel Sound present, Non tender  ?Extremities: no Pedal edema ?Neurology: alert and oriented to time, place, and person ?Gait not checked due to patient safety concerns  ? ?Data Reviewed: ?I have Reviewed nursing notes, Vitals, and Lab results since pt's last encounter. Pertinent lab results CBC and BMP, pleural fluid studies ?I have ordered test including CBC and BMP ?I have ordered imaging studies x-ray chest. ?I have reviewed the last note from IR,   ? ?Family Communication: None at bedside ? ?Disposition: ?Status is: Inpatient ?Remains inpatient appropriate because: Requires repeat thoracentesis, currently on IV antibiotics. ? ?Author: ?Berle Mull, MD ?05/14/2021 8:14 PM ? ?For on call review [email or web address] ?

## 2021-05-14 NOTE — Hospital Course (Signed)
Patricia Pena is a 57 y.o. female with medical history significant of HTN, arthritis and morbid obesity who presents with large left pleural effusion.  ?Found to have large exudative pleural effusion SP thoracentesis. ?

## 2021-05-15 ENCOUNTER — Inpatient Hospital Stay (HOSPITAL_COMMUNITY): Payer: Medicaid Other

## 2021-05-15 DIAGNOSIS — J90 Pleural effusion, not elsewhere classified: Secondary | ICD-10-CM

## 2021-05-15 DIAGNOSIS — C3490 Malignant neoplasm of unspecified part of unspecified bronchus or lung: Secondary | ICD-10-CM | POA: Diagnosis not present

## 2021-05-15 LAB — GRAM STAIN

## 2021-05-15 LAB — PROCALCITONIN: Procalcitonin: <0.1 ng/mL

## 2021-05-15 MED ORDER — LIDOCAINE HCL (PF) 1 % IJ SOLN
INTRAMUSCULAR | Status: AC
  Filled 2021-05-15: qty 30

## 2021-05-15 MED ORDER — AMOXICILLIN-POT CLAVULANATE 875-125 MG PO TABS
1.0000 | ORAL_TABLET | Freq: Two times a day (BID) | ORAL | Status: DC
  Administered 2021-05-15: 1 via ORAL
  Filled 2021-05-15: qty 1

## 2021-05-15 MED ORDER — FUROSEMIDE 20 MG PO TABS
20.0000 mg | ORAL_TABLET | Freq: Every day | ORAL | 0 refills | Status: DC | PRN
Start: 2021-05-15 — End: 2021-08-05

## 2021-05-15 MED ORDER — AMOXICILLIN-POT CLAVULANATE 875-125 MG PO TABS: 1.0000 | ORAL_TABLET | Freq: Two times a day (BID) | ORAL | 0 refills | Status: AC

## 2021-05-15 MED ORDER — SACCHAROMYCES BOULARDII 250 MG PO CAPS: 250.0000 mg | ORAL_CAPSULE | Freq: Two times a day (BID) | ORAL | 0 refills | Status: AC

## 2021-05-15 NOTE — Progress Notes (Signed)
D/c tele and Ivs. Went over AVS with pt and all questions were answered.  ? ?Lavenia Atlas, RN ? ?

## 2021-05-15 NOTE — Procedures (Signed)
PROCEDURE SUMMARY: ? ?Successful image-guided left thoracentesis. ?Yielded 550 mL of serosanguineous fluid. ?Pt tolerated procedure well. ?No immediate complications. ?EBL = trace  ? ?Specimen was  sent for labs. ?CXR ordered. ? ?Please see imaging section of Epic for full dictation. ? ?Tera Mater PA-C ?05/15/2021 ?10:33 AM ? ? ? ?

## 2021-05-15 NOTE — TOC Transition Note (Signed)
Transition of Care (TOC) - CM/SW Discharge Note ?[name] Therapist, sports, BSN ?Transitions of Care ?Unit 4E- RN Case Manager ?See Treatment Team for direct phone #  ? ? ?Patient Details  ?Name: Patricia Pena ?MRN: [id number] ?Date of Birth: 03-Apr-1965 ? ?Transition of Care (TOC) CM/SW Contact:  ?Dahlia Client, Romeo Rabon, RN ?Phone Number: ?05/15/2021, 12:59 PM ? ? ?Clinical Narrative:    ?Pt stable for transition home today. CM spoke with pt at bedside, discussed PCP needs- per pt she goes to [facility] however would like to find a new Primary care home. Discussed [facility]- and pt is willing to drive to [facility]- prefers to try [facility]- info provided to pt on clinic with instructions to call in the am to request hospital f/u appointment.  ?Also discussed medication assistance- pt is eligible for MATCH- meds have been sent to [facility]. Pt reports [facility] has called about meds and provided her with total cost. At this time pt is stating she can afford the cost of her meds and would rather hold off on using MATCH. CM will not assist on this discharge and did NOT provide [place] letter as per pt reports she does not feel she will need assistance this time.  ? ?No further TOC needs noted.  ? ? ?Final next level of care: Home/Self Care ?Barriers to Discharge: No Barriers Identified ? ? ?Patient Goals and CMS Choice ?Patient states their goals for this hospitalization and ongoing recovery are:: return home ?  ?Choice offered to / list presented to : NA ? ?Discharge Placement ?  ?           ?  ? Home ?  ?  ? ?Discharge Plan and Services ?  ?Discharge Planning Services: CM Consult, [facility], Medication Assistance ?Post Acute Care Choice: NA          ?DME Arranged: N/A ?DME Agency: NA ?  ?  ?  ?HH Arranged: NA ?[place] Agency: NA ?  ?  ?  ? ?Social Determinants of Health (SDOH) Interventions ?  ? ? ?Readmission Risk Interventions ? ?  05/15/2021  ? 12:59 PM  ?Readmission Risk Prevention Plan  ?Post Dischage  Appt Complete  ?Medication Screening Complete  ?Transportation Screening Complete  ? ? ? ? ? ?

## 2021-05-16 LAB — ANA W/REFLEX IF POSITIVE: Anti Nuclear Antibody (ANA): NEGATIVE

## 2021-05-16 NOTE — ED Provider Notes (Signed)
?[facility] ?Provider Note ? ? ?CSN: [id number] ?Arrival date & time: 05/13/21  1626 ? ?  ? ?History ? ?Chief Complaint  ?Patient presents with  ? Shortness of Breath  ? ? ?Patricia Pena is a 56 y.o. female. ? ?Patient presents ER chief complaint of shortness of breath.  Symptoms ongoing for several months, however worse in the last week or so.  She went to urgent care had an x-ray done concerning for pleural effusion on the left side.  Otherwise has unchanged cough for several weeks.  No reports of fevers no vomiting or diarrhea. ? ? ?  ? ?Home Medications ?Prior to Admission medications   ?Medication Sig Start Date End Date Taking? Authorizing Provider  ?CALCIUM-VITAMIN D PO Take 2,000 mg by mouth daily.    Yes [provider]  ?cholecalciferol (VITAMIN D) 1000 units tablet Take 1,000 Units by mouth daily.   Yes [provider]  ?co-enzyme Q-10 50 MG capsule Take 50 mg by mouth daily.   Yes [provider]  ?Cyanocobalamin (VITAMIN B-12 PO) Take 1 tablet by mouth daily.   Yes [provider]  ?Desoximetasone 0.05 % OINT Apply 1 application. topically daily as needed (For skin rash). 04/07/17  Yes [provider]  ?furosemide (LASIX) 20 MG tablet Take 1 tablet (20 mg total) by mouth daily as needed (weight gain of 3lbs in 1 day or 5lbs in 1 week). 05/15/21 05/15/22 Yes Lavina Hamman, MD  ?lisinopril-hydrochlorothiazide (PRINZIDE,ZESTORETIC) 20-25 MG tablet TAKE 1 TABLET BY MOUTH EVERY DAY ?Patient taking differently: Take 1 tablet by mouth daily. 04/24/18  Yes Jacelyn Pi, Lilia Argue, MD  ?amoxicillin-clavulanate (AUGMENTIN) 875-125 MG tablet Take 1 tablet by mouth 2 (two) times daily for 12 days. 05/15/21 05/27/21  Lavina Hamman, MD  ?saccharomyces boulardii (FLORASTOR) 250 MG capsule Take 1 capsule (250 mg total) by mouth 2 (two) times daily for 15 days. 05/15/21 05/30/21  Lavina Hamman, MD  ?   ? ?Allergies    ?Codeine   ? ?Review of Systems   ?Review  of Systems  ?Constitutional:  Negative for fever.  ?HENT:  Negative for ear pain.   ?Eyes:  Negative for pain.  ?Respiratory:  Positive for shortness of breath.   ?Cardiovascular:  Negative for chest pain.  ?Gastrointestinal:  Negative for abdominal pain.  ?Genitourinary:  Negative for flank pain.  ?Musculoskeletal:  Negative for back pain.  ?Skin:  Negative for rash.  ?Neurological:  Negative for headaches.  ? ?Physical Exam ?Updated Vital Signs ?BP (!) 120/107 (BP Location: Left Arm)   Pulse 86   Temp 97.9 ?F (36.6 ?C) (Oral)   Resp 20   SpO2 95%  ?Physical Exam ?Constitutional:   ?   General: She is not in acute distress. ?   Appearance: Normal appearance.  ?HENT:  ?   Head: Normocephalic.  ?   Nose: Nose normal.  ?Eyes:  ?   Extraocular Movements: Extraocular movements intact.  ?Cardiovascular:  ?   Rate and Rhythm: Normal rate.  ?Pulmonary:  ?   Effort: Pulmonary effort is normal.  ?   Breath sounds: No wheezing, rhonchi or rales.  ?Musculoskeletal:     ?   General: Normal range of motion.  ?   Cervical back: Normal range of motion.  ?Neurological:  ?   General: No focal deficit present.  ?   Mental Status: She is alert. Mental status is at baseline.  ? ? ?ED Results /  Procedures / Treatments   ?Labs ?(all labs ordered are listed, but only abnormal results are displayed) ?Labs Reviewed  ?BASIC METABOLIC PANEL - Abnormal; Notable for the following components:  ?    Result Value  ? Glucose, Bld 127 (*)   ? All other components within normal limits  ?D-DIMER, QUANTITATIVE - Abnormal; Notable for the following components:  ? D-Dimer, Quant 0.58 (*)   ? All other components within normal limits  ?BASIC METABOLIC PANEL - Abnormal; Notable for the following components:  ? Glucose, Bld 140 (*)   ? All other components within normal limits  ?C-REACTIVE PROTEIN - Abnormal; Notable for the following components:  ? CRP 1.1 (*)   ? All other components within normal limits  ?SEDIMENTATION RATE - Abnormal; Notable for  the following components:  ? Sed Rate 45 (*)   ? All other components within normal limits  ?LACTATE DEHYDROGENASE, PLEURAL OR PERITONEAL FLUID - Abnormal; Notable for the following components:  ? LD, Fluid 200 (*)   ? All other components within normal limits  ?CULTURE, BODY FLUID W GRAM STAIN -BOTTLE  ?GRAM STAIN  ?CULTURE, BODY FLUID W GRAM STAIN -BOTTLE  ?GRAM STAIN  ?CBC  ?BRAIN NATRIURETIC PEPTIDE  ?HIV ANTIBODY (ROUTINE TESTING W REFLEX)  ?TSH  ?LACTATE DEHYDROGENASE  ?ANA W/REFLEX IF POSITIVE  ?PROCALCITONIN  ?BASIC METABOLIC PANEL  ?ALBUMIN, PLEURAL OR PERITONEAL FLUID   ?PROTEIN, PLEURAL OR PERITONEAL FLUID  ?GLUCOSE, PLEURAL OR PERITONEAL FLUID  ?PROCALCITONIN  ?MISC [facility] TEST (SEND OUT)  ?CYTOLOGY - NON PAP  ?CYTOLOGY - NON PAP  ? ? ?EKG ?EKG Interpretation ? ?Date/Time:  Friday May 13 2021 16:37:12 EDT ?Ventricular Rate:  107 ?PR Interval:  146 ?QRS Duration: 86 ?QT Interval:  358 ?QTC Calculation: 477 ?R Axis:   6 ?Text Interpretation: Sinus tachycardia with occasional Premature ventricular complexes Low voltage QRS Borderline ECG When compared with ECG of 14-Jun-2011 07:01, PREVIOUS ECG IS PRESENT Confirmed by Thamas Jaegers ([id number]) on 05/13/2021 6:56:47 PM ? ?Radiology ?DG Chest 1 View ? ?Result Date: 05/15/2021 ?CLINICAL DATA:  Status post thoracentesis. EXAM: CHEST  1 VIEW COMPARISON:  Earlier same base FINDINGS: 1057 hours. No evidence for pneumothorax. Left base collapse/consolidation with moderate left pleural effusion again noted. Slight decrease in right pleural fluid volume. Right lung clear. The cardio pericardial silhouette is enlarged. Telemetry leads overlie the chest. IMPRESSION: No evidence for pneumothorax after left thoracentesis. Electronically Signed   By: Misty Stanley M.D.   On: 05/15/2021 10:45  ? ?DG Chest 2 View ? ?Result Date: 05/15/2021 ?CLINICAL DATA:  Shortness of breath EXAM: CHEST - 2 VIEW COMPARISON:  Prior chest x-ray 05/14/2021 FINDINGS: Stable cardiomegaly and mediastinal  contours. Persistent dense left basilar opacity most consistent with a small to moderate layering pleural effusion. Developing patchy airspace opacity in the periphery of the left upper lung concerning for pneumonia. Advanced background bronchitic changes. No pneumothorax. IMPRESSION: 1. Developing patchy airspace opacities in the periphery of the left upper and mid lung concerning for pneumonia. 2. Moderate layering left pleural effusion. 3. Advanced background bronchitic changes. Electronically Signed   By: Jacqulynn Cadet M.D.   On: 05/15/2021 07:22  ? ?[place] CHEST ([place]) ? ?Result Date: 05/15/2021 ?CLINICAL DATA:  Recurrent pleural effusion. Status post a LEFT-sided thoracentesis on 05/14/2021. EXAM: CHEST ULTRASOUND COMPARISON:  None. FINDINGS: LEFT pleural effusion is redemonstrated, small to moderate in size. IMPRESSION: LEFT pleural effusion, small to moderate in size. Electronically Signed   By: Roxy Horseman.D.  On: 05/15/2021 09:46  ? ?US THORACENTESIS ASP PLEURAL SPACE W/IMG GUIDE ? ?Result Date: 05/15/2021 ?INDICATION: Persistent shortness of breath, CTA chest on 05/13/2021 showed new large partially loculated left pleural effusion, s/p therapeutic and diagnostic thoracentesis on 05/14/2021. CXR today showed persistent large left pleural effusion, request for repeat therapeutic and diagnostic thoracentesis. EXAM: ULTRASOUND GUIDED LEFT THORACENTESIS MEDICATIONS: 15 mL 1% lidocaine COMPLICATIONS: None immediate. PROCEDURE: An ultrasound guided thoracentesis was thoroughly discussed with the patient and questions answered. The benefits, risks, alternatives and complications were also discussed. The patient understands and wishes to proceed with the procedure. Written consent was obtained. Ultrasound was performed to localize and mark an adequate pocket of fluid in the left chest. The area was then prepped and draped in the normal sterile fashion. 1% Lidocaine was used for local anesthesia. Under  ultrasound guidance a 6 Fr Safe-T-Centesis catheter was introduced. Thoracentesis was performed. The catheter was removed and a dressing applied. FINDINGS: A total of approximately 550 mL of serosanguine

## 2021-05-18 NOTE — Discharge Summary (Signed)
?Physician Discharge Summary ?  ?Patient: Patricia Pena MRN: [id number] DOB: [birth year]  ?Admit date:     05/13/2021  ?Discharge date: 05/15/2021  ?Discharge Physician: Berle Mull  ?PCP: [facility] ? ?Recommendations at discharge: ?Follow-up with PCP in 1 week. ?Please follow-up on the results of the cytology ?Repeat chest x-ray recommended in 1 to 2 weeks. ? ?Discharge Diagnoses: ?Principal Problem: ?  Pleural effusion ?Active Problems: ?  Multifocal pneumonia ?  Morbid obesity ([place]) ?  Essential hypertension ?  Frequent PVCs ?  Chronic systolic dysfunction of right ventricle ? ? ?Hospital Course: ?Patricia Pena is a 56 y.o. female with medical history significant of HTN, arthritis and morbid obesity who presents with large left pleural effusion.  ?Found to have large exudative pleural effusion SP thoracentesis. ? ?Assessment and Plan: ?* Pleural effusion, left ?Symptoms ongoing for more than a year. ?Underwent thoracentesis, 1.2 L removed on 05/14/2021. ?Appears to be exudative in nature. ?Meet lights criteria for exudative effusion. ?Concerning serosanguineous appearance. ?Fluid cultures are so far negative. ?Continue to monitor cytology. ?Repeat x-ray the next morning was performed which showed persistent pleural effusion.  Patient had a repeat thoracentesis with 550 mL removed. ?Procalcitonin level is negative thus making severe pneumonia causing parapneumonic effusion with very less likely. ?ESR is minimally elevated, CRP is next to normal, all other inflammatory work-up is still pending.  BNP is not significantly elevated.  Echocardiogram shows preserved EF.  CT PE protocol is negative for PE. ?At present malignant pleural effusion is high in differential. ?We will continue with antibiotics on discharge. ? ?Morbid obesity ([place]) ?BMI more than 40. ?Placing the patient at high risk of poor outcome. ?Patient also appears to have symptoms of sleep apnea and will require outpatient sleep study. ?We  will attempt to arrange for outpatient PCP for the patient given her lack of insurance. ? ?Essential hypertension ?Continue home regimen.  As needed Lasix for weight gain. ? ?Chronic systolic dysfunction of right ventricle ?Most likely this is in the setting of undiagnosed OSA. ?Recommend patient to have outpatient further work-up. ? ?Frequent PVCs ?Given patient's presentation with pleural effusion as well as frequent PACs on telemetry and echocardiogram was performed. ?Preserved EF. ? ?Consultants: IR for thoracentesis ?Procedures performed:  ?Thoracentesis ?DISCHARGE MEDICATION: ?Allergies as of 05/15/2021   ? ?   Reactions  ? Codeine Other (See Comments)  ? headache  ? ?  ? ?  ?Medication List  ?  ? ?STOP taking these medications   ? ?Crisaborole 2 % Oint ?Commonly known as: [place] ?  ?hydrOXYzine 25 MG tablet ?Commonly known as: ATARAX ?  ?meloxicam 7.5 MG tablet ?Commonly known as: MOBIC ?  ?triamcinolone cream 0.1 % ?Commonly known as: KENALOG ?  ? ?  ? ?TAKE these medications   ? ?amoxicillin-clavulanate 875-125 MG tablet ?Commonly known as: AUGMENTIN ?Take 1 tablet by mouth 2 (two) times daily for 12 days. ?  ?CALCIUM-VITAMIN D PO ?Take 2,000 mg by mouth daily. ?  ?cholecalciferol 1000 units tablet ?Commonly known as: VITAMIN D ?Take 1,000 Units by mouth daily. ?  ?co-enzyme Q-10 50 MG capsule ?Take 50 mg by mouth daily. ?  ?Desoximetasone 0.05 % Oint ?Apply 1 application. topically daily as needed (For skin rash). ?  ?furosemide 20 MG tablet ?Commonly known as: Lasix ?Take 1 tablet (20 mg total) by mouth daily as needed (weight gain of 3lbs in 1 day or 5lbs in 1 week). ?  ?lisinopril-hydrochlorothiazide 20-25 MG tablet ?Commonly known as:  ZESTORETIC ?TAKE 1 TABLET BY MOUTH EVERY DAY ?  ?saccharomyces boulardii 250 MG capsule ?Commonly known as: FLORASTOR ?Take 1 capsule (250 mg total) by mouth 2 (two) times daily for 15 days. ?  ?VITAMIN B-12 PO ?Take 1 tablet by mouth daily. ?  ? ?  ? ? Follow-up  Information   ? ? [facility] .   ?Contact information: ?[address] ?[place] [address] ?[phone] ? ? ?  ?  ? ? [place]. Call on 05/16/2021.   ?Why: call to ask for hospital follow up appointment- let them know you discharged from hospital and need f/u appointment and would like to establish primary care. ?Contact information: ?[address] ?[place] [address] ?[phone] ? ?  ?  ? ?  ?  ? ?  ? ?Disposition: Home ?Diet recommendation: Cardiac diet ? ?Discharge Exam: ?There were no vitals filed for this visit. ?General: Appear in mild distress; no visible Abnormal Neck Mass Or lumps, Conjunctiva normal ?Cardiovascular: S1 and S2 Present, no Murmur, ?Respiratory: good respiratory effort, Bilateral Air entry present and faint left-sided crackles, no wheezes ?Abdomen: Bowel Sound present Non tender  ?Extremities: no Pedal edema ?Neurology: alert and oriented to time, place, and person ?Gait not checked due to patient safety concerns  ? ?Condition at discharge: good ? ?The results of significant diagnostics from this hospitalization (including imaging, microbiology, ancillary and laboratory) are listed below for reference.  ? ?Imaging Studies: ?DG Chest 1 View ? ?Result Date: 05/15/2021 ?CLINICAL DATA:  Status post thoracentesis. EXAM: CHEST  1 VIEW COMPARISON:  Earlier same base FINDINGS: 1057 hours. No evidence for pneumothorax. Left base collapse/consolidation with moderate left pleural effusion again noted. Slight decrease in right pleural fluid volume. Right lung clear. The cardio pericardial silhouette is enlarged. Telemetry leads overlie the chest. IMPRESSION: No evidence for pneumothorax after left thoracentesis. Electronically Signed   By: Misty Stanley M.D.   On: 05/15/2021 10:45  ? ?DG Chest 1 View ? ?Result Date: 05/14/2021 ?CLINICAL DATA:  Post thoracentesis on the left. EXAM: CHEST  1 VIEW COMPARISON:   Radiographs 05/13/2021 and 10/13/2008.  CT 05/13/2021. FINDINGS: 1228 hours. The left pleural effusion has mildly decreased in volume. There is no evidence of pneumothorax. The heart size and mediastinal contours are stable. There are stable left basilar and peripheral upper lobe airspace opacities. The right lung is clear. The bones appear unremarkable. Telemetry leads overlie the chest. IMPRESSION: Decreased size of left pleural effusion following thoracentesis. No evidence of pneumothorax. Electronically Signed   By: Richardean Sale M.D.   On: 05/14/2021 12:57  ? ?DG Chest 2 View ? ?Result Date: 05/15/2021 ?CLINICAL DATA:  Shortness of breath EXAM: CHEST - 2 VIEW COMPARISON:  Prior chest x-ray 05/14/2021 FINDINGS: Stable cardiomegaly and mediastinal contours. Persistent dense left basilar opacity most consistent with a small to moderate layering pleural effusion. Developing patchy airspace opacity in the periphery of the left upper lung concerning for pneumonia. Advanced background bronchitic changes. No pneumothorax. IMPRESSION: 1. Developing patchy airspace opacities in the periphery of the left upper and mid lung concerning for pneumonia. 2. Moderate layering left pleural effusion. 3. Advanced background bronchitic changes. Electronically Signed   By: Jacqulynn Cadet M.D.   On: 05/15/2021 07:22  ? ?DG Chest 2 View ? ?Result Date: 05/13/2021 ?CLINICAL DATA:  Shortness of breath EXAM: CHEST - 2 VIEW COMPARISON:  08/20/2017 FINDINGS: Cardiomegaly. Large left pleural effusion associated atelectasis or consolidation. The  right lung is normally aerated. Disc degenerative disease of the thoracic spine. IMPRESSION: 1. Large left pleural and effusion associated atelectasis or consolidation. 2. Cardiomegaly. Electronically Signed   By: Delanna Ahmadi M.D.   On: 05/13/2021 15:43  ? ?CT Angio Chest PE W and/or Wo Contrast ? ?Result Date: 05/13/2021 ?CLINICAL DATA:  Positive D-dimer, shortness of breath EXAM: CT ANGIOGRAPHY  CHEST WITH CONTRAST TECHNIQUE: Multidetector CT imaging of the chest was performed using the standard protocol during bolus administration of intravenous contrast. Multiplanar CT image reconstructions and MIPs were obtained

## 2021-05-19 LAB — CYTOLOGY - NON PAP

## 2021-05-19 LAB — CULTURE, BODY FLUID W GRAM STAIN -BOTTLE: Culture: NO GROWTH

## 2021-05-19 LAB — MISC LABCORP TEST (SEND OUT): Labcorp test code: 9985

## 2021-05-20 ENCOUNTER — Ambulatory Visit (INDEPENDENT_AMBULATORY_CARE_PROVIDER_SITE_OTHER): Payer: Medicaid Other | Admitting: Nurse Practitioner

## 2021-05-20 ENCOUNTER — Ambulatory Visit: Payer: Self-pay

## 2021-05-20 ENCOUNTER — Encounter: Payer: Self-pay | Admitting: Nurse Practitioner

## 2021-05-20 ENCOUNTER — Encounter: Payer: Self-pay | Admitting: *Deleted

## 2021-05-20 VITALS — BP 114/56 | HR 115 | Temp 98.3°F | Ht 62.0 in | Wt 289.0 lb

## 2021-05-20 DIAGNOSIS — R896 Abnormal cytological findings in specimens from other organs, systems and tissues: Secondary | ICD-10-CM

## 2021-05-20 DIAGNOSIS — F419 Anxiety disorder, unspecified: Secondary | ICD-10-CM

## 2021-05-20 DIAGNOSIS — J910 Malignant pleural effusion: Secondary | ICD-10-CM

## 2021-05-20 LAB — CULTURE, BODY FLUID W GRAM STAIN -BOTTLE: Culture: NO GROWTH

## 2021-05-20 MED ORDER — HYDROXYZINE HCL 10 MG PO TABS: 10.0000 mg | ORAL_TABLET | Freq: Three times a day (TID) | ORAL | 0 refills | Status: AC | PRN

## 2021-05-20 NOTE — Progress Notes (Signed)
Oncology Nurse Navigator Documentation ? ? ?  05/20/2021  ?  2:00 PM  ?Oncology Nurse Navigator Flowsheets  ?Abnormal Finding Date 05/13/2021  ?Confirmed Diagnosis Date 05/15/2021  ?Diagnosis Status Confirmed Diagnosis Complete  ?Navigator Follow Up Date: 05/24/2021  ?Navigator Follow Up Reason: New Patient Appointment  ?Navigator Location [facility]  ?Referral Date to RadOnc/MedOnc 05/20/2021  ?Navigator Encounter Type Other:;Telephone  ?Telephone Outgoing Call  ?Treatment Phase Pre-Tx/Tx Discussion  ?Barriers/Navigation Needs Coordination of Care;Education  ?Education Other  ?Interventions Coordination of Care/I received a message from Dr. Alen Blew of new patient referral. I called patient and set her up to be seen next week on 4/18.  She verbalized understanding of appt.   ?Acuity Level 2-Minimal Needs (1-2 Barriers Identified)  ?Coordination of Care Appts  ?Time Spent with Patient 30  ?  ?

## 2021-05-20 NOTE — Progress Notes (Signed)
'@Patient'  ID: Patricia Pena, female    DOB: 1965-06-11, 56 y.o.   MRN: [id number] ? ?Chief Complaint  ?Patient presents with  ? Establish Care  ?  Patient is here today to establish care and discuss her hospital visit and lab results from the hospital.  ? ? ?Referring provider: ?[facility] ? ?56 year old female with history of hypertension, arthritis, former smoker (quit 8 years ago).  Recent pleural effusion requiring hospitalization. ? ?Recent significant events: ? ?Admit date:     05/13/2021  ?Discharge date: 05/15/2021  ? ?Hospital Course: ? ?Pleural effusion, left ?Symptoms ongoing for more than a year. ?Underwent thoracentesis, 1.2 L removed on 05/14/2021. ?Appears to be exudative in nature. ?Meet lights criteria for exudative effusion. ?Concerning serosanguineous appearance. ?Fluid cultures are so far negative. ?Continue to monitor cytology. ?Repeat x-ray the next morning was performed which showed persistent pleural effusion.  Patient had a repeat thoracentesis with 550 mL removed. ?Procalcitonin level is negative thus making severe pneumonia causing parapneumonic effusion with very less likely. ?ESR is minimally elevated, CRP is next to normal, all other inflammatory work-up is still pending.  BNP is not significantly elevated.  Echocardiogram shows preserved EF.  CT PE protocol is negative for PE. ?At present malignant pleural effusion is high in differential. ?We will continue with antibiotics on discharge. ?  ?Morbid obesity ([place]) ?BMI more than 40. ?Placing the patient at high risk of poor outcome. ?Patient also appears to have symptoms of sleep apnea and will require outpatient sleep study. ?We will attempt to arrange for outpatient PCP for the patient given her lack of insurance. ?  ?Essential hypertension ?Continue home regimen.  As needed Lasix for weight gain. ?  ?Chronic systolic dysfunction of right ventricle ?Most likely this is in the setting of undiagnosed OSA. ?Recommend patient to  have outpatient further work-up. ?  ?Frequent PVCs ?Given patient's presentation with pleural effusion as well as frequent PACs on telemetry and echocardiogram was performed. ?Preserved EF. ? ?Cytology Report: ? ?Specimen Submitted:  A. PLEURAL FLUID, LEFT, THORACENTESIS:  ? ? 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 ? ?HPI ? ?Patient presents today for hospital follow-up.  Please see hospital course as noted above.  Patient is very distressed today because she received her cytology report through [facility].  She did not get a call from the hospitalist.  This is very upsetting to the patient.  We discussed the cytology report and called oncology.  She will be seen for follow-up visit with oncology this upcoming Tuesday.  Overall patient has been feeling better since hospital discharge.  She states that she is able to breathe better and get around better since thoracentesis.  She does have good family support.  Her son is with her in the office today and she has another son who lives out of state but is on video call in the office today. Denies f/c/s, n/v/d, hemoptysis, PND, chest pain or edema. ? ? ? ? ? ?Allergies  ?Allergen Reactions  ? Codeine Other (See Comments)  ?  headache  ? ? ?Immunization History  ?Administered Date(s) Administered  ? Hepatitis B 03/11/2013  ? Hepatitis B, adult 03/11/2013  ? PPD Test 03/09/2013, 03/10/2013  ? Tdap 04/03/2011  ? ? ?Past Medical History:  ?Diagnosis Date  ? Arthritis   ? Incisional hernia without mention of obstruction or gangrene   ? Morbid obesity ([place])   ? Seizures ([place])   ? last one  was in 1985  ? ? ?Tobacco History: ?Social History  ? ?Tobacco Use  ?Smoking Status Former  ? Packs/day: 1.50  ? Years: 35.00  ? Pack years: 52.50  ? Types: Cigarettes  ? Quit date: 12/28/2009  ? Years since quitting: 11.4  ?Smokeless Tobacco Never  ? ?Counseling given: Not Answered ? ? ?Outpatient Encounter Medications as of  05/20/2021  ?Medication Sig  ? amoxicillin-clavulanate (AUGMENTIN) 875-125 MG tablet Take 1 tablet by mouth 2 (two) times daily for 12 days.  ? Desoximetasone 0.05 % OINT Apply 1 application. topically daily as needed (For skin rash).  ? hydrOXYzine (ATARAX) 10 MG tablet Take 1 tablet (10 mg total) by mouth 3 (three) times daily as needed.  ? lisinopril-hydrochlorothiazide (PRINZIDE,ZESTORETIC) 20-25 MG tablet TAKE 1 TABLET BY MOUTH EVERY DAY (Patient taking differently: Take 1 tablet by mouth daily.)  ? saccharomyces boulardii (FLORASTOR) 250 MG capsule Take 1 capsule (250 mg total) by mouth 2 (two) times daily for 15 days.  ? CALCIUM-VITAMIN D PO Take 2,000 mg by mouth daily.  (Patient not taking: Reported on 05/20/2021)  ? cholecalciferol (VITAMIN D) 1000 units tablet Take 1,000 Units by mouth daily. (Patient not taking: Reported on 05/20/2021)  ? co-enzyme Q-10 50 MG capsule Take 50 mg by mouth daily. (Patient not taking: Reported on 05/20/2021)  ? Cyanocobalamin (VITAMIN B-12 PO) Take 1 tablet by mouth daily. (Patient not taking: Reported on 05/20/2021)  ? furosemide (LASIX) 20 MG tablet Take 1 tablet (20 mg total) by mouth daily as needed (weight gain of 3lbs in 1 day or 5lbs in 1 week). (Patient not taking: Reported on 05/20/2021)  ? ?No facility-administered encounter medications on file as of 05/20/2021.  ? ? ? ?Review of Systems ? ?Review of Systems  ?Constitutional: Negative.   ?HENT: Negative.    ?Cardiovascular: Negative.   ?Gastrointestinal: Negative.   ?Allergic/Immunologic: Negative.   ?Neurological: Negative.   ?Psychiatric/Behavioral: Negative.     ? ? ? ?Physical Exam ? ?BP (!) 114/56   Pulse (!) 115   Temp 98.3 ?F (36.8 ?C)   Ht '5\' 2"'  (1.575 m)   Wt 289 lb (131.1 kg)   SpO2 98%   BMI 52.86 kg/m?  ? ?Wt Readings from Last 5 Encounters:  ?05/20/21 289 lb (131.1 kg)  ?09/15/17 288 lb 3.2 oz (130.7 kg)  ?09/13/17 280 lb (127 kg)  ?08/20/17 282 lb 9.6 oz (128.2 kg)  ?05/25/17 275 lb 3.2 oz (124.8 kg)   ? ? ? ?Physical Exam ?Vitals and nursing note reviewed.  ?Constitutional:   ?   General: She is not in acute distress. ?   Appearance: She is well-developed.  ?Cardiovascular:  ?   Rate and Rhythm: Normal rate and regular rhythm.  ?Pulmonary:  ?   Effort: Pulmonary effort is normal.  ?   Breath sounds: Normal breath sounds.  ?Neurological:  ?   Mental Status: She is alert and oriented to person, place, and time.  ? ? ? ? ?Assessment & Plan:  ? ?Abnormal cytology ?Specimen Submitted:  A. PLEURAL FLUID, LEFT, THORACENTESIS:  ? ? 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 ? ?- Ambulatory referral to Hematology / Oncology ? ?2. Anxiety ? ?- hydrOXYzine (ATARAX) 10 MG tablet; Take 1 tablet (10 mg total) by mouth 3 (three) times daily as needed.  Dispense: 30 tablet; Refill: 0 ? ?Follow up: ? ?Follow up with oncology as scheduled ? ? ? ? ?Kriste Basque  Nils Pyle, NP ?05/23/2021 ? ?

## 2021-05-20 NOTE — Telephone Encounter (Signed)
? ? ?  Chief Complaint: Pt. Is asking to be seen sooner than 05/25/21, because she saw "my lab work that says I have cancer." Pt. Very upset. ?Symptoms: "I don't feel well." ?Frequency:  ?Pertinent Negatives: Patient denies  ?Disposition: [] ED /[] Urgent Care (no appt availability in office) / [] Appointment(In office/virtual)/ []  [facility]/ [] Home Care/ [] Refused Recommended Disposition /[] [facility]/ []  Follow-up with PCP ?Additional Notes: Please advise pt.  ?Answer Assessment - Initial Assessment Questions ?1. REASON FOR CALL: "What is your main concern right now?" ?    Hospital follow up - "My lab work says I have cancer." ?2. ONSET: "When did the  start?" ?    In hospital this month ?3. SEVERITY: "How bad is the ?" ?    N/a ?4. FEVER: "Do you have a fever?" ?    No ?5. OTHER SYMPTOMS: "Do you have any other new symptoms?" ?    Does not feel well and is "stressed out." ?6. TREATMENTS AND RESPONSE: "What have you done so far to try to make this better? What medicines have you used?" ?    N/a ?7. PREGNANCY: "Is there any chance you are pregnant?" "When was your last menstrual period?" ?    No ? ?Protocols used: No Guideline Available-A-AH ? ?

## 2021-05-23 ENCOUNTER — Encounter: Payer: Self-pay | Admitting: Nurse Practitioner

## 2021-05-23 DIAGNOSIS — R896 Abnormal cytological findings in specimens from other organs, systems and tissues: Secondary | ICD-10-CM | POA: Insufficient documentation

## 2021-05-23 NOTE — Assessment & Plan Note (Signed)
Specimen Submitted:  A. PLEURAL FLUID, LEFT, THORACENTESIS:  ? ? 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 ? ?- Ambulatory referral to Hematology / Oncology ? ?2. Anxiety ? ?- hydrOXYzine (ATARAX) 10 MG tablet; Take 1 tablet (10 mg total) by mouth 3 (three) times daily as needed.  Dispense: 30 tablet; Refill: 0 ? ?Follow up: ? ?Follow up with oncology as scheduled ?

## 2021-05-23 NOTE — Patient Instructions (Addendum)
1. Abnormal cytology ? ?Specimen Submitted:  A. PLEURAL FLUID, LEFT, THORACENTESIS:  ? ? 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 ? ?- Ambulatory referral to Hematology / Oncology ? ?2. Anxiety ? ?- hydrOXYzine (ATARAX) 10 MG tablet; Take 1 tablet (10 mg total) by mouth 3 (three) times daily as needed.  Dispense: 30 tablet; Refill: 0 ? ?Follow up: ? ?Follow up with oncology as scheduled ? ?

## 2021-05-24 ENCOUNTER — Inpatient Hospital Stay: Payer: Medicaid Other | Attending: Internal Medicine | Admitting: Internal Medicine

## 2021-05-24 ENCOUNTER — Encounter: Payer: Self-pay | Admitting: *Deleted

## 2021-05-24 ENCOUNTER — Encounter: Payer: Self-pay | Admitting: Internal Medicine

## 2021-05-24 ENCOUNTER — Inpatient Hospital Stay: Payer: Medicaid Other

## 2021-05-24 ENCOUNTER — Other Ambulatory Visit: Payer: Self-pay | Admitting: Internal Medicine

## 2021-05-24 ENCOUNTER — Other Ambulatory Visit: Payer: Self-pay

## 2021-05-24 VITALS — BP 126/83 | HR 107 | Temp 97.3°F | Ht 62.0 in | Wt 287.0 lb

## 2021-05-24 DIAGNOSIS — C3490 Malignant neoplasm of unspecified part of unspecified bronchus or lung: Secondary | ICD-10-CM | POA: Insufficient documentation

## 2021-05-24 DIAGNOSIS — Z803 Family history of malignant neoplasm of breast: Secondary | ICD-10-CM

## 2021-05-24 DIAGNOSIS — Z87891 Personal history of nicotine dependence: Secondary | ICD-10-CM | POA: Diagnosis not present

## 2021-05-24 DIAGNOSIS — Z801 Family history of malignant neoplasm of trachea, bronchus and lung: Secondary | ICD-10-CM | POA: Diagnosis not present

## 2021-05-24 DIAGNOSIS — J910 Malignant pleural effusion: Secondary | ICD-10-CM

## 2021-05-24 DIAGNOSIS — C3492 Malignant neoplasm of unspecified part of left bronchus or lung: Secondary | ICD-10-CM

## 2021-05-24 DIAGNOSIS — Z79899 Other long term (current) drug therapy: Secondary | ICD-10-CM | POA: Insufficient documentation

## 2021-05-24 DIAGNOSIS — C7800 Secondary malignant neoplasm of unspecified lung: Secondary | ICD-10-CM

## 2021-05-24 DIAGNOSIS — Z5111 Encounter for antineoplastic chemotherapy: Secondary | ICD-10-CM | POA: Diagnosis present

## 2021-05-24 LAB — CMP (CANCER CENTER ONLY)
ALT: 18 U/L (ref 0–44)
AST: 17 U/L (ref 15–41)
Albumin: 3.9 g/dL (ref 3.5–5.0)
Alkaline Phosphatase: 50 U/L (ref 38–126)
Anion gap: 7 (ref 5–15)
BUN: 8 mg/dL (ref 6–20)
CO2: 29 mmol/L (ref 22–32)
Calcium: 9.5 mg/dL (ref 8.9–10.3)
Chloride: 99 mmol/L (ref 98–111)
Creatinine: 0.81 mg/dL (ref 0.44–1.00)
GFR, Estimated: >60 mL/min (ref >60)
Glucose, Bld: 134 mg/dL — ABNORMAL HIGH (ref 70–99)
Potassium: 3.9 mmol/L (ref 3.5–5.1)
Sodium: 135 mmol/L (ref 135–145)
Total Bilirubin: 0.5 mg/dL (ref 0.3–1.2)
Total Protein: 7.3 g/dL (ref 6.5–8.1)

## 2021-05-24 LAB — CBC WITH DIFFERENTIAL (CANCER CENTER ONLY)
Abs Immature Granulocytes: 0.04 10*3/uL (ref 0.00–0.07)
Basophils Absolute: 0.1 10*3/uL (ref 0.0–0.1)
Basophils Relative: 1 %
Eosinophils Absolute: 0 10*3/uL (ref 0.0–0.5)
Eosinophils Relative: 0 %
HCT: 42.1 % (ref 36.0–46.0)
Hemoglobin: 14.6 g/dL (ref 12.0–15.0)
Immature Granulocytes: 0 %
Lymphocytes Relative: 17 %
Lymphs Abs: 1.7 10*3/uL (ref 0.7–4.0)
MCH: 32.4 pg (ref 26.0–34.0)
MCHC: 34.7 g/dL (ref 30.0–36.0)
MCV: 93.3 fL (ref 80.0–100.0)
Monocytes Absolute: 0.6 10*3/uL (ref 0.1–1.0)
Monocytes Relative: 6 %
Neutro Abs: 7.4 10*3/uL (ref 1.7–7.7)
Neutrophils Relative %: 76 %
Platelet Count: 301 10*3/uL (ref 150–400)
RBC: 4.51 MIL/uL (ref 3.87–5.11)
RDW: 12.1 % (ref 11.5–15.5)
WBC Count: 9.8 10*3/uL (ref 4.0–10.5)
nRBC: 0 % (ref 0.0–0.2)

## 2021-05-24 MED ORDER — PROCHLORPERAZINE MALEATE 10 MG PO TABS: 10.0000 mg | ORAL_TABLET | Freq: Four times a day (QID) | ORAL | 0 refills | Status: DC | PRN

## 2021-05-24 MED ORDER — LIDOCAINE-PRILOCAINE 2.5-2.5 % EX CREA: TOPICAL_CREAM | CUTANEOUS | 0 refills | Status: AC

## 2021-05-24 NOTE — Progress Notes (Signed)
? ? [place] ?Telephone:([phone]   [phone] ? ?CONSULT NOTE ? ?REFERRING PHYSICIAN: Dr. Berle Mull ? ?REASON FOR CONSULTATION:  ?56 years old white female recently diagnosed with lung cancer. ? ?HPI ?Patricia Pena is a 56 y.o. female with past medical history significant for osteoarthritis, morbid obesity, seizure activity the last 1 on was in 1985 as well as history of smoking but quit in 2011.  The patient mentions that she has been suffering from shortness of breath for more than a year.  She started having more cough and inability to walk much because of the shortness of breath.  She was seen initially at [place] for evaluation and EKG was abnormal and she was referred for cardiac work-up that was unremarkable.  Her condition was not improving and the patient presented to the [facility] health urgent care for evaluation and chest x-ray on 05/13/2021 showed large left pleural effusion associated with atelectasis or consolidation in addition to cardiomegaly.  The patient had CT angiogram of the chest on 05/13/2021 and it showed no evidence of central pulmonary artery embolism and there was no evidence of thoracic aortic dissection but there was large left pleural effusion with infiltrate seen in the left upper lobe and left lower lobe suggesting possible multifocal pneumonia.  She also has mild compression of the bodies of T3, T4 and T5 vertebrae.  On 05/14/2021 the patient underwent ultrasound-guided left thoracentesis with drainage of 1200 cc of pleural fluid.  Repeat ultrasound-guided thoracentesis on 05/15/2021 had a drainage of 550 mL of pleural fluid. ?The cytology ([id number]) from the left pleural effusion showed malignant cells consistent with small cell undifferentiated carcinoma. Immunohistochemical stain for synaptophysin and CD56 are positive, consistent with above interpretation.  Ki-67 shows a high proliferative index.  Immunostain for p63 is negative. ?The patient  was referred to me today for evaluation and recommendation regarding treatment of her condition. ?When seen today she is very anxious about her condition.  The moment I entered the room she said , I want to live.  She has been googling her condition and understand the poor prognosis.  She feels a little bit better today after the drainage of the pleural fluid.  She is using the spirometer to increase her lung capacity.  She continues to have shortness of breath with exertion and tachycardia secondary to anxiety and breathing issues.  She denied having any chest pain but has mild cough with no hemoptysis.  She has no recent weight loss or night sweats.  He has no nausea, vomiting but has intermittent diarrhea after her cholecystectomy with no constipation or abdominal pain.  She has no headache but has some visual changes. ?Family history significant for father died from lung cancer at age 68.  Mother had MDS and paternal aunt had breast cancer. ?The patient is divorced and has 3 children.  She was accompanied today by her son, Patricia Pena.  She works in a [employer].  She has a history of smoking more than 1 pack/day for around 35 years and quit in 2013.  She drinks alcohol occasionally and no history of drug abuse. ? ?HPI ? ?Past Medical History:  ?Diagnosis Date  ? Arthritis   ? Incisional hernia without mention of obstruction or gangrene   ? Morbid obesity ([place])   ? Seizures ([place])   ? last one was in 1985  ? ? ?Past Surgical History:  ?Procedure Laterality Date  ? ABDOMINAL HYSTERECTOMY  2010  ? at [facility]  ?  BREAST CYST ASPIRATION Right 2010  ? BREAST CYST EXCISION  1994  ? right breast  ? BREAST EXCISIONAL BIOPSY Right   ? benign cyst  ? [place]  ? LEFT HEART CATHETERIZATION WITH CORONARY ANGIOGRAM N/A 06/14/2011  ? Procedure: LEFT HEART CATHETERIZATION WITH CORONARY ANGIOGRAM;  Surgeon: Candee Furbish, MD;  Location: [facility] CATH LAB;  Service: Cardiovascular;  Laterality: N/A;  ? VAGINA SURGERY  2007   ? ? ?Family History  ?Problem Relation Age of Onset  ? Cancer Father   ?     lung  ? Breast cancer Paternal Aunt   ? ? ?Social History ?Social History  ? ?Tobacco Use  ? Smoking status: Former  ?  Packs/day: 1.50  ?  Years: 35.00  ?  Pack years: 52.50  ?  Types: Cigarettes  ?  Quit date: 12/28/2009  ?  Years since quitting: 11.4  ? Smokeless tobacco: Never  ?Vaping Use  ? Vaping Use: Never used  ?Substance Use Topics  ? Alcohol use: Yes  ?  Comment: occasionally  ? Drug use: No  ? ? ?Allergies  ?Allergen Reactions  ? Codeine Other (See Comments)  ?  headache  ? ? ?Current Outpatient Medications  ?Medication Sig Dispense Refill  ? amoxicillin-clavulanate (AUGMENTIN) 875-125 MG tablet Take 1 tablet by mouth 2 (two) times daily for 12 days. 24 tablet 0  ? CALCIUM-VITAMIN D PO Take 2,000 mg by mouth daily.  (Patient not taking: Reported on 05/20/2021)    ? cholecalciferol (VITAMIN D) 1000 units tablet Take 1,000 Units by mouth daily. (Patient not taking: Reported on 05/20/2021)    ? co-enzyme Q-10 50 MG capsule Take 50 mg by mouth daily. (Patient not taking: Reported on 05/20/2021)    ? Cyanocobalamin (VITAMIN B-12 PO) Take 1 tablet by mouth daily. (Patient not taking: Reported on 05/20/2021)    ? Desoximetasone 0.05 % OINT Apply 1 application. topically daily as needed (For skin rash).  0  ? furosemide (LASIX) 20 MG tablet Take 1 tablet (20 mg total) by mouth daily as needed (weight gain of 3lbs in 1 day or 5lbs in 1 week). (Patient not taking: Reported on 05/20/2021) 10 tablet 0  ? hydrOXYzine (ATARAX) 10 MG tablet Take 1 tablet (10 mg total) by mouth 3 (three) times daily as needed. 30 tablet 0  ? lisinopril-hydrochlorothiazide (PRINZIDE,ZESTORETIC) 20-25 MG tablet TAKE 1 TABLET BY MOUTH EVERY DAY (Patient taking differently: Take 1 tablet by mouth daily.) 30 tablet 0  ? saccharomyces boulardii (FLORASTOR) 250 MG capsule Take 1 capsule (250 mg total) by mouth 2 (two) times daily for 15 days. 30 capsule 0  ? ?No current  facility-administered medications for this visit.  ? ? ?Review of Systems ? ?Constitutional: positive for fatigue ?Eyes: negative ?Ears, nose, mouth, throat, and face: negative ?Respiratory: positive for cough and dyspnea on exertion ?Cardiovascular: negative ?Gastrointestinal: positive for diarrhea ?Genitourinary:negative ?Integument/breast: negative ?Hematologic/lymphatic: negative ?Musculoskeletal:negative ?Neurological: negative ?Behavioral/Psych: positive for anxiety ?Endocrine: negative ?Allergic/Immunologic: negative ? ?Physical Exam ? ?BXI:DHWYS, healthy, no distress, well nourished, well developed, and anxious ?SKIN: skin color, texture, turgor are normal, no rashes or significant lesions ?HEAD: Normocephalic, No masses, lesions, tenderness or abnormalities ?EYES: normal, PERRLA, Conjunctiva are pink and non-injected ?EARS: External ears normal, Canals clear ?OROPHARYNX:no exudate, no erythema, and lips, buccal mucosa, and tongue normal  ?NECK: supple, no adenopathy, no JVD ?LYMPH:  no palpable lymphadenopathy, no hepatosplenomegaly ?BREAST:not examined ?LUNGS: coarse sounds heard, decreased breath sounds ?HEART: regular rate &  rhythm, no murmurs, and no gallops ?ABDOMEN:abdomen soft, non-tender, obese, normal bowel sounds, and no masses or organomegaly ?BACK: Back symmetric, no curvature., No CVA tenderness ?EXTREMITIES:no joint deformities, effusion, or inflammation, no edema  ?NEURO: alert & oriented x 3 with fluent speech, no focal motor/sensory deficits ? ?PERFORMANCE STATUS: ECOG 1 ? ?LABORATORY DATA: ?Lab Results  ?Component Value Date  ? WBC 9.8 05/24/2021  ? HGB 14.6 05/24/2021  ? HCT 42.1 05/24/2021  ? MCV 93.3 05/24/2021  ? PLT 301 05/24/2021  ? ? ?  Chemistry   ?   ?Component Value Date/Time  ? NA 138 05/14/2021 1249  ? NA 143 05/21/2017 1101  ? K 3.9 05/14/2021 1249  ? CL 105 05/14/2021 1249  ? CO2 25 05/14/2021 1249  ? BUN 8 05/14/2021 1249  ? BUN 13 05/21/2017 1101  ? CREATININE 0.74  05/14/2021 1249  ? CREATININE 0.78 08/02/2015 1944  ?    ?Component Value Date/Time  ? CALCIUM 9.2 05/14/2021 1249  ? ALKPHOS 45 09/21/2016 0000  ? AST 16 09/21/2016 0000  ? ALT 12 09/21/2016 0000  ? BILITOT <0

## 2021-05-24 NOTE — Patient Instructions (Addendum)
Etoposide, VP-16 capsules ?What is this medication? ?ETOPOSIDE, VP-16 (e toe POE side) is a chemotherapy drug. It is used to treat small cell lung cancer and other cancers. ?This medicine may be used for other purposes; ask your health care provider or pharmacist if you have questions. ?COMMON BRAND NAME(S): VePesid ?What should I tell my care team before I take this medication? ?They need to know if you have any of these conditions: ?infection ?kidney disease ?liver disease ?low blood counts, like low white cell, platelet, or red cell counts ?an unusual or allergic reaction to etoposide, other medicines, foods, dyes, or preservatives ?pregnant or trying to get pregnant ?breast-feeding ?How should I use this medication? ?Take this medicine by mouth with a glass of water. Follow the directions on the prescription label. Do not open, crush, or chew the capsules. It is advisable to wear gloves when handling this medicine. Take your medicine at regular intervals. Do not take it more often than directed. Do not stop taking except on your doctor's advice. ?Talk to your pediatrician regarding the use of this medicine in children. Special care may be needed. ?Overdosage: If you think you have taken too much of this medicine contact a poison control center or emergency room at once. ?NOTE: This medicine is only for you. Do not share this medicine with others. ?What if I miss a dose? ?If you miss a dose, take it as soon as you can. If it is almost time for your next dose, take only that dose. Do not take double or extra doses. ?What may interact with this medication? ?This medicine may interact with the following medications: ?cyclosporine ?warfarin ?This list may not describe all possible interactions. Give your health care provider a list of all the medicines, herbs, non-prescription drugs, or dietary supplements you use. Also tell them if you smoke, drink alcohol, or use illegal drugs. Some items may interact with your  medicine. ?What should I watch for while using this medication? ?Visit your doctor for checks on your progress. This drug may make you feel generally unwell. This is not uncommon, as chemotherapy can affect healthy cells as well as cancer cells. Report any side effects. Continue your course of treatment even though you feel ill unless your doctor tells you to stop. ?In some cases, you may be given additional medicines to help with side effects. Follow all directions for their use. ?Call your doctor or health care professional for advice if you get a fever, chills or sore throat, or other symptoms of a cold or flu. Do not treat yourself. This drug decreases your body's ability to fight infections. Try to avoid being around people who are sick. ?This medicine may increase your risk to bruise or bleed. Call your doctor or health care professional if you notice any unusual bleeding. ?Talk to your doctor about your risk of cancer. You may be more at risk for certain types of cancers if you take this medicine. ?Do not become pregnant while taking this medicine or for at least 6 months after stopping it. Women should inform their doctor if they wish to become pregnant or think they might be pregnant. Women of child-bearing potential will need to have a negative pregnancy test before starting this medicine. There is a potential for serious side effects to an unborn child. Talk to your health care professional or pharmacist for more information. Do not breast-feed an infant while taking this medicine. ?Men must use a latex condom during sexual contact with a  woman while taking this medicine and for at least 4 months after stopping it. A latex condom is needed even if you have had a vasectomy. Contact your doctor right away if your partner becomes pregnant. Do not donate sperm while taking this medicine and for 4 months after you stop taking this medicine. Men should inform their doctors if they wish to father a child. This  medicine may lower sperm counts. ?What side effects may I notice from receiving this medication? ?Side effects that you should report to your doctor or health care professional as soon as possible: ?allergic reactions like skin rash, itching or hives, swelling of the face, lips, or tongue ?low blood counts - this medicine may decrease the number of white blood cells, red blood cells, and platelets. You may be at increased risk for infections and bleeding ?nausea, vomiting ?redness, blistering, peeling or loosening of the skin, including inside the mouth ?signs and symptoms of infection like fever; chills; cough; sore throat; pain or trouble passing urine ?signs and symptoms of low red blood cells or anemia such as unusually weak or tired; feeling faint or lightheaded; falls; breathing problems ?unusual bruising or bleeding ?Side effects that usually do not require medical attention (report to your doctor or health care professional if they continue or are bothersome): ?changes in taste ?diarrhea ?hair loss ?loss of appetite ?mouth sores ?This list may not describe all possible side effects. Call your doctor for medical advice about side effects. You may report side effects to FDA at 1-800-FDA-1088. ?Where should I keep my medication? ?Keep out of the reach of children. ?Store in a refrigerator between 2 and 8 degrees C (36 and 46 degrees F). Do not freeze. Throw away any unused medicine after the expiration date. ?NOTE: This sheet is a summary. It may not cover all possible information. If you have questions about this medicine, talk to your doctor, pharmacist, or health care provider. ?? 2023 Elsevier/Gold Standard (2020-12-24 00:00:00) ?Carboplatin injection ?What is this medication? ?CARBOPLATIN (KAR boe pla tin) is a chemotherapy drug. It targets fast dividing cells, like cancer cells, and causes these cells to die. This medicine is used to treat ovarian cancer and many other cancers. ?This medicine may be used  for other purposes; ask your health care provider or pharmacist if you have questions. ?COMMON BRAND NAME(S): Paraplatin ?What should I tell my care team before I take this medication? ?They need to know if you have any of these conditions: ?blood disorders ?hearing problems ?kidney disease ?recent or ongoing radiation therapy ?an unusual or allergic reaction to carboplatin, cisplatin, other chemotherapy, other medicines, foods, dyes, or preservatives ?pregnant or trying to get pregnant ?breast-feeding ?How should I use this medication? ?This drug is usually given as an infusion into a vein. It is administered in a hospital or clinic by a specially trained health care professional. ?Talk to your pediatrician regarding the use of this medicine in children. Special care may be needed. ?Overdosage: If you think you have taken too much of this medicine contact a poison control center or emergency room at once. ?NOTE: This medicine is only for you. Do not share this medicine with others. ?What if I miss a dose? ?It is important not to miss a dose. Call your doctor or health care professional if you are unable to keep an appointment. ?What may interact with this medication? ?medicines for seizures ?medicines to increase blood counts like filgrastim, pegfilgrastim, sargramostim ?some antibiotics like amikacin, gentamicin, neomycin, streptomycin, tobramycin ?vaccines ?  Talk to your doctor or health care professional before taking any of these medicines: ?acetaminophen ?aspirin ?ibuprofen ?ketoprofen ?naproxen ?This list may not describe all possible interactions. Give your health care provider a list of all the medicines, herbs, non-prescription drugs, or dietary supplements you use. Also tell them if you smoke, drink alcohol, or use illegal drugs. Some items may interact with your medicine. ?What should I watch for while using this medication? ?Your condition will be monitored carefully while you are receiving this  medicine. You will need important blood work done while you are taking this medicine. ?This drug may make you feel generally unwell. This is not uncommon, as chemotherapy can affect healthy cells as well as cance

## 2021-05-24 NOTE — Progress Notes (Signed)
START ON PATHWAY REGIMEN - Small Cell Lung ? ? ?  Cycles 1 through 4: A cycle is every 21 days: ?    Durvalumab  ?    Carboplatin  ?    Etoposide  ?  Cycles 5 and beyond: A cycle is every 28 days: ?    Durvalumab  ? ?**Always confirm dose/schedule in your pharmacy ordering system** ? ?Patient Characteristics: ?Newly Diagnosed, Preoperative or Nonsurgical Candidate (Clinical Staging), First Line, Extensive Stage ?Therapeutic Status: Newly Diagnosed, Preoperative or Nonsurgical Candidate (Clinical Staging) ?AJCC T Category: cTX ?AJCC N Category: cNX ?AJCC M Category: AC1Y ?AJCC 8 Stage Grouping: IVA ?Stage Classification: Extensive ? ?Intent of Therapy: ?Non-Curative / Palliative Intent, Discussed with Patient ?

## 2021-05-24 NOTE — Progress Notes (Signed)
Oncology Nurse Navigator Documentation ? ? ?  05/24/2021  ?  3:00 PM 05/20/2021  ?  2:00 PM  ?Oncology Nurse Navigator Flowsheets  ?Abnormal Finding Date  05/13/2021  ?Confirmed Diagnosis Date  05/15/2021  ?Diagnosis Status  Confirmed Diagnosis Complete  ?Navigator Follow Up Date: 05/26/2021 05/24/2021  ?Navigator Follow Up Reason: Appointment Review New Patient Appointment  ?Navigator Location [facility]  ?Referral Date to RadOnc/MedOnc  05/20/2021  ?Navigator Encounter Type Clinic/MDC;Initial MedOnc Other:;Telephone  ?Telephone  Outgoing Call  ?Patient Visit Type MedOnc;Initial   ?Treatment Phase Pre-Tx/Tx Discussion Pre-Tx/Tx Discussion  ?Barriers/Navigation Needs Education;Coordination of Care/I met Patricia Pena today at her first appt with Dr. Julien Nordmann.  Patient treatment plan is workup and systemic therapy to start next week.  I gave her information on [place] to help with disability.  I contacted and made referral to CSW for help with disability.  Patient is worried about calorie intake.  Per Dr. Julien Nordmann, I made referral to Dietitian. I helped to explain plan of care. I did add information on treatment medication to Dr. Worthy Flank AVS.  Coordination of Care;Education  ?Education Contractor;Other;Newly Diagnosed Cancer Education Other  ?Interventions Coordination of Care;Psycho-Social Support;Education;Disability/FMLA Coordination of Care  ?Acuity Level 3-Moderate Needs (3-4 Barriers Identified) Level 2-Minimal Needs (1-2 Barriers Identified)  ?Coordination of Care Other Appts  ?Education Method Verbal;Other   ?Time Spent with Patient 60 30  ?  ?

## 2021-05-25 ENCOUNTER — Inpatient Hospital Stay: Payer: Self-pay | Admitting: Physician Assistant

## 2021-05-26 ENCOUNTER — Encounter: Payer: Self-pay | Admitting: *Deleted

## 2021-05-26 ENCOUNTER — Ambulatory Visit: Payer: Self-pay

## 2021-05-26 ENCOUNTER — Telehealth: Payer: Self-pay | Admitting: Internal Medicine

## 2021-05-26 ENCOUNTER — Encounter: Payer: Self-pay | Admitting: Licensed Clinical Social Worker

## 2021-05-26 DIAGNOSIS — C3492 Malignant neoplasm of unspecified part of left bronchus or lung: Secondary | ICD-10-CM

## 2021-05-26 NOTE — Progress Notes (Signed)
Oncology Nurse Navigator Documentation ? ? ?  05/26/2021  ? 12:00 PM 05/24/2021  ?  3:00 PM 05/20/2021  ?  2:00 PM  ?Oncology Nurse Navigator Flowsheets  ?Abnormal Finding Date   05/13/2021  ?Confirmed Diagnosis Date   05/15/2021  ?Diagnosis Status   Confirmed Diagnosis Complete  ?Navigator Follow Up Date: 05/27/2021 05/26/2021 05/24/2021  ?Navigator Follow Up Reason: Appointment Review Appointment Review New Patient Appointment  ?Navigator Location [facility]  ?Referral Date to RadOnc/MedOnc   05/20/2021  ?Navigator Encounter Type Telephone Clinic/MDC;Initial MedOnc Other:;Telephone  ?Telephone Outgoing Call  [place] Call  ?Patient Visit Type  MedOnc;Initial   ?Treatment Phase Pre-Tx/Tx Discussion Pre-Tx/Tx Discussion Pre-Tx/Tx Discussion  ?Barriers/Navigation Needs Coordination of Care Education;Coordination of Care Coordination of Care;Education  ?Education  Contractor;Other;Newly Diagnosed Cancer Education Other  ?Interventions Coordination of Care/I followed up on Ms. Hmric's schedule and noticed she needs a few appts scheduled. I contacted radiology and was able to schedule PET and MRI Brain.  I called patient to update on these appt and pre-procedure instructions with PET scan, only water after MN the night  before.  She needed her chemo ed class changed. I changed it and update her on the new time.  I reached out to [place] regarding her chemo schedule. Wait for responds.  Coordination of Care;Psycho-Social Support;Education;Disability/FMLA Coordination of Care  ?Acuity Level 3-Moderate Needs (3-4 Barriers Identified) Level 3-Moderate Needs (3-4 Barriers Identified) Level 2-Minimal Needs (1-2 Barriers Identified)  ?Coordination of Care Appts Other Appts  ?Education Method Verbal Verbal;Other   ?Time Spent with Patient 45 60 30  ?  ?

## 2021-05-26 NOTE — Progress Notes (Signed)
Pt recently diagnosed w/ lung cancer.  Pt's anxiety connected to diagnosis and financial concerns.  Linking to financial resources and will assess for appropriateness of counseling once treatment begins. ?

## 2021-05-26 NOTE — Progress Notes (Signed)
[facility]  ?Initial Assessment ? ? ?Patricia Pena is a 56 y.o. year old female contacted by phone. Clinical Social Work was referred by medical provider for assessment of psychosocial needs.  ? ?SDOH (Social Determinants of Health) assessments performed: Yes ?SDOH Interventions   ? ?Flowsheet Row Most Recent Value  ?SDOH Interventions   ?Financial Strain Interventions Development worker, community  ? ?  ?  ?Distress Screen completed: No ?   ? View : No data to display.  ?  ?  ?  ? ? ? ? ?Family/Social Information:  ?Housing Arrangement: patient lives alone ?Family members/support persons in your life? Family ?Transportation concerns: no  ?Employment: Working part time. Income source: Patient was working at a [employer] w/ no benefits and will not be able to continue once treatment begins ?Financial concerns: Yes, current concerns ?Type of concern: Medical bills ?Food access concerns: no ?Religious or spiritual practice: not assessed ?Services Currently in place:  none ? ?Coping/ Adjustment to diagnosis: ?Patient understands treatment plan and what happens next? yes ?Concerns about diagnosis and/or treatment: Overwhelmed by information ?Patient reported stressors: Finances ?Hopes and priorities: Pt's hope is to begin treatment w/ positive results ?Patient enjoys time with family/ friends ?Current coping skills/ strengths: Supportive family/friends  ? ? ? SUMMARY: ?Current SDOH Barriers:  ?Financial constraints related to lack of insurance and benefits ? ?Clinical Social Work Clinical Goal(s):  ?explore Data processing manager options for unmet needs related to:  Financial Strain  ? ?Interventions: ?Discussed common feeling and emotions when being diagnosed with cancer, and the importance of support during treatment ?Informed patient of the support team roles and support services at [facility] ?Provided CSW contact information and encouraged patient to call with any questions or concerns ?Referred patient to financial  counselor, [place] and provided information for community resources that assist financially w/ non-medical expenses.  Also encouraged pt to apply for Medicaid as it may be approved before disability. ? ? ?Follow Up Plan: CSW will see patient on 4/24 in infusion ?Patient verbalizes understanding of plan: Yes ? ? ? ?Henriette Combs, LCSW ?

## 2021-05-27 ENCOUNTER — Encounter: Payer: Self-pay | Admitting: Licensed Clinical Social Worker

## 2021-05-27 ENCOUNTER — Other Ambulatory Visit: Payer: Self-pay

## 2021-05-27 DIAGNOSIS — C3492 Malignant neoplasm of unspecified part of left bronchus or lung: Secondary | ICD-10-CM

## 2021-05-27 NOTE — Progress Notes (Signed)
[facility] CSW Progress Note ? ?Clinical Social Worker submitted [employer] on behalf of patient.  CSW to continue to provide supportive services throughout duration of treatment as appropriate. ? ? ? ?Henriette Combs , LCSW ?

## 2021-05-29 ENCOUNTER — Encounter (HOSPITAL_COMMUNITY): Payer: Self-pay | Admitting: Emergency Medicine

## 2021-05-29 ENCOUNTER — Emergency Department (HOSPITAL_COMMUNITY)
Admission: EM | Admit: 2021-05-29 | Discharge: 2021-05-29 | Disposition: A | Discharge status: Home / Self Care | Payer: Medicaid Other | Attending: Emergency Medicine | Admitting: Emergency Medicine

## 2021-05-29 ENCOUNTER — Emergency Department (HOSPITAL_COMMUNITY): Payer: Medicaid Other

## 2021-05-29 DIAGNOSIS — J90 Pleural effusion, not elsewhere classified: Secondary | ICD-10-CM | POA: Insufficient documentation

## 2021-05-29 DIAGNOSIS — Z79899 Other long term (current) drug therapy: Secondary | ICD-10-CM | POA: Diagnosis not present

## 2021-05-29 DIAGNOSIS — R531 Weakness: Secondary | ICD-10-CM | POA: Diagnosis not present

## 2021-05-29 DIAGNOSIS — R0609 Other forms of dyspnea: Secondary | ICD-10-CM | POA: Diagnosis present

## 2021-05-29 LAB — BASIC METABOLIC PANEL
Anion gap: 8 (ref 5–15)
BUN: 13 mg/dL (ref 6–20)
CO2: 24 mmol/L (ref 22–32)
Calcium: 9.3 mg/dL (ref 8.9–10.3)
Chloride: 104 mmol/L (ref 98–111)
Creatinine, Ser: 0.74 mg/dL (ref 0.44–1.00)
GFR, Estimated: >60 mL/min (ref >60)
Glucose, Bld: 141 mg/dL — ABNORMAL HIGH (ref 70–99)
Potassium: 3.7 mmol/L (ref 3.5–5.1)
Sodium: 136 mmol/L (ref 135–145)

## 2021-05-29 LAB — BRAIN NATRIURETIC PEPTIDE: B Natriuretic Peptide: 37.8 pg/mL (ref 0.0–100.0)

## 2021-05-29 NOTE — ED Provider Notes (Signed)
?[facility] DEPT ?Provider Note ? ? ?CSN: [id number] ?Arrival date & time: 05/29/21  1407 ? ?  ? ?History ? ?Chief Complaint  ?Patient presents with  ? Weakness  ? ? ?Patricia Pena is a 56 y.o. female. ? ?56 year old female with prior medical history as detailed below presents for evaluation.  Patient reports gradually worsening dyspnea with exertion.  Patient with history of lung cancer.  She has a chemo appointment scheduled for tomorrow morning.  She reports prior history of pleural effusion with thoracentesis being required earlier this month. ? ?She feels that her symptoms today are consistent with prior symptoms leading to her need for thoracentesis. ?She denies fever.  She denies nausea or vomiting.  She is able to ambulate without difficulty.  She reports dyspnea with prolonged exertion.  This has gradually increased over the last week. ? ?The history is provided by the patient and medical records.  ?Weakness ?Severity:  Mild ?Onset quality:  Gradual ?Duration:  1 week ?Timing:  Constant ?Progression:  Worsening ?Chronicity:  New ? ?  ? ?Home Medications ?Prior to Admission medications   ?Medication Sig Start Date End Date Taking? Authorizing Provider  ?CALCIUM-VITAMIN D PO Take 2,000 mg by mouth daily.  ?Patient not taking: Reported on 05/20/2021    [provider]  ?cholecalciferol (VITAMIN D) 1000 units tablet Take 1,000 Units by mouth daily. ?Patient not taking: Reported on 05/20/2021    [provider]  ?co-enzyme Q-10 50 MG capsule Take 50 mg by mouth daily. ?Patient not taking: Reported on 05/20/2021    [provider]  ?Cyanocobalamin (VITAMIN B-12 PO) Take 1 tablet by mouth daily. ?Patient not taking: Reported on 05/20/2021    [provider]  ?Desoximetasone 0.05 % OINT Apply 1 application. topically daily as needed (For skin rash). 04/07/17   [provider]  ?furosemide (LASIX) 20 MG tablet Take 1 tablet (20 mg total) by mouth  daily as needed (weight gain of 3lbs in 1 day or 5lbs in 1 week). ?Patient not taking: Reported on 05/20/2021 05/15/21 05/15/22  Lavina Hamman, MD  ?hydrOXYzine (ATARAX) 10 MG tablet Take 1 tablet (10 mg total) by mouth 3 (three) times daily as needed. ?Patient not taking: Reported on 05/24/2021 05/20/21   [name], NP  ?lidocaine-prilocaine (EMLA) cream Apply to the Port-A-Cath site 30 minutes before chemotherapy 05/24/21   Curt Bears, MD  ?lisinopril-hydrochlorothiazide (PRINZIDE,ZESTORETIC) 20-25 MG tablet TAKE 1 TABLET BY MOUTH EVERY DAY ?Patient taking differently: Take 1 tablet by mouth daily. 04/24/18   Daleen Squibb, MD  ?prochlorperazine (COMPAZINE) 10 MG tablet Take 1 tablet (10 mg total) by mouth every 6 (six) hours as needed for nausea or vomiting. 05/24/21   Curt Bears, MD  ?saccharomyces boulardii (FLORASTOR) 250 MG capsule Take 1 capsule (250 mg total) by mouth 2 (two) times daily for 15 days. 05/15/21 05/30/21  Lavina Hamman, MD  ?   ? ?Allergies    ?Codeine   ? ?Review of Systems   ?Review of Systems  ?Neurological:  Positive for weakness.  ?All other systems reviewed and are negative. ? ?Physical Exam ?Updated Vital Signs ?BP 110/87 (BP Location: Right Arm)   Pulse (!) 106   Temp 97.8 ?F (36.6 ?C) (Oral)   Resp 17   Ht 5\' 3"  (1.6 m)   Wt 127.5 kg   SpO2 96%   BMI 49.78 kg/m?  ?Physical Exam ?Vitals and nursing note reviewed.  ?Constitutional:   ?  General: She is not in acute distress. ?   Appearance: Normal appearance. She is well-developed.  ?HENT:  ?   Head: Normocephalic and atraumatic.  ?Eyes:  ?   Conjunctiva/sclera: Conjunctivae normal.  ?   Pupils: Pupils are equal, round, and reactive to light.  ?Cardiovascular:  ?   Rate and Rhythm: Normal rate and regular rhythm.  ?   Heart sounds: Normal heart sounds.  ?Pulmonary:  ?   Effort: Pulmonary effort is normal. No respiratory distress.  ?   Comments: Decreased BS to left base ?Abdominal:  ?   General: There is no  distension.  ?   Palpations: Abdomen is soft.  ?   Tenderness: There is no abdominal tenderness.  ?Musculoskeletal:     ?   General: No deformity. Normal range of motion.  ?   Cervical back: Normal range of motion and neck supple.  ?Skin: ?   General: Skin is warm and dry.  ?Neurological:  ?   General: No focal deficit present.  ?   Mental Status: She is alert and oriented to person, place, and time.  ? ? ?ED Results / Procedures / Treatments   ?Labs ?(all labs ordered are listed, but only abnormal results are displayed) ?Labs Reviewed  ?BASIC METABOLIC PANEL - Abnormal; Notable for the following components:  ?    Result Value  ? Glucose, Bld 141 (*)   ? All other components within normal limits  ?BRAIN NATRIURETIC PEPTIDE  ? ? ?EKG ?EKG Interpretation ? ?Date/Time:  Sunday May 29 2021 15:01:56 EDT ?Ventricular Rate:  114 ?PR Interval:  136 ?QRS Duration: 95 ?QT Interval:  333 ?QTC Calculation: 459 ?R Axis:   22 ?Text Interpretation: Sinus tachycardia Consider right atrial enlargement Borderline T wave abnormalities Confirmed by Dene Gentry [phone]) on 05/29/2021 5:22:36 PM ? ?Radiology ?DG Chest 2 View ? ?Result Date: 05/29/2021 ?CLINICAL DATA:  Shortness of breath. EXAM: CHEST - 2 VIEW COMPARISON:  May 15, 2021 FINDINGS: The cardiac silhouette is partially obscured by large left pleural effusion, increased in size from the most recent comparison study. Streaky interstitial opacities in both lungs. Compressive atelectasis in the left lung. IMPRESSION: 1. Large left pleural effusion, increased in size from the most recent comparison study. 2. Streaky interstitial opacities in both lungs. Electronically Signed   By: Fidela Salisbury M.D.   On: 05/29/2021 15:29   ? ?Procedures ?Procedures  ? ? ?Medications Ordered in ED ?Medications - No data to display ? ?ED Course/ Medical Decision Making/ A&P ?  ?                        ?Medical Decision Making ? ? ?Medical Screen Complete ? ?This patient presented to the ED  with complaint of dyspnea on exertion. ? ?This complaint involves an extensive number of treatment options. The initial differential diagnosis includes, but is not limited to, recurrent left pleural effusion ? ?This presentation is: Acute, Chronic, Self-Limited, Previously Undiagnosed, Uncertain Prognosis, Complicated, Systemic Symptoms, and Threat to Life/Bodily Function ? ?Patient with prior history of left pleural effusion.  Patient now presents with worsening symptoms consistent with likely recurrent left pleural effusion. ? ?Of note, patient is not hypoxic.  She is able to ambulate.  She is in no acute distress. ?Patient declines overnight admission. ? ?Case discussed briefly with Dr. Earleen Newport covering interventional radiology.  Patient will be placed on the scheduling list for thoracentesis.  Patient is advised that she should be contacted  tomorrow morning regarding scheduling of thoracentesis. ? ?Case discussed briefly with Dr Lindi Adie covering oncology.  He is in agreement the patient should present tomorrow for chemo and will also pass on the message of the patient requiring treatment for recurrent left pleural effusion. ? ?Co morbidities that complicated the patient's evaluation ? ?Lung cancer ? ? ?Additional history obtained: ? ?External records from outside sources obtained and reviewed including prior ED visits and prior Inpatient records.  ? ? ?Lab Tests: ? ?I ordered and personally interpreted labs.  The pertinent results include: BMP, BNP ? ? ?Imaging Studies ordered: ? ?I ordered imaging studies including Chest XR  ?I independently visualized and interpreted obtained imaging which showed left pleural effusion ?I agree with the radiologist interpretation. ? ? ?Cardiac Monitoring: ? ?The patient was maintained on a cardiac monitor.  I personally viewed and interpreted the cardiac monitor which showed an underlying rhythm of: NSR ? ?Problem List / ED Course: ? ?Recurrent Left Pleural  Effusion ? ? ?Reevaluation: ? ?After the interventions noted above, I reevaluated the patient and found that they have: stayed the same ? ? ?Disposition: ? ?After consideration of the diagnostic results and the patients response to

## 2021-05-29 NOTE — ED Provider Triage Note (Signed)
Emergency Medicine Provider Triage Evaluation Note ? ?Patricia Pena , a 56 y.o. female  was evaluated in triage.  Pt complains of recent diagnosis lung CA, starts chemo tomorrow. Here with worsening DOS, SHOB. Decreased PO intake due to low Na diet. Struggling to get high calorie foods that are low in Na and sugar.  ?Felt SHOB at [facility] yesterday, felt like she may pass out. Ran a lot of errands yesterday.  ? ?Review of Systems  ?Positive: SHOB, DOE ?Negative: Fever, CP ? ?Physical Exam  ?BP 101/78 (BP Location: Left Leg)   Pulse (!) 113   Temp 97.8 ?F (36.6 ?C) (Oral)   Resp 18   SpO2 94%  ?Gen:   Awake, no distress   ?Resp:  Normal effort  ?MSK:   Moves extremities without difficulty  ?Other:   ? ?Medical Decision Making  ?Medically screening exam initiated at 2:37 PM.  Appropriate orders placed.  Patricia Pena was informed that the remainder of the evaluation will be completed by another provider, this initial triage assessment does not replace that evaluation, and the importance of remaining in the ED until their evaluation is complete. ? ? ?  ?Tacy Learn, PA-C ?05/29/21 1438 ? ?

## 2021-05-29 NOTE — ED Triage Notes (Signed)
Patients reports increased weakness and worsening dyspnea on exertion the last couple of days. Hx lung cancer. Reports scheduled to start chemo tomorrow. ?

## 2021-05-29 NOTE — Discharge Instructions (Addendum)
Return for any problem. ? ?You should expect a phone call from the interventional radiology scheduling team tomorrow morning.  They are working on scheduling a repeat thoracentesis to help take off some of the fluid from around your lung. ? ?Please inform your oncology team of your symptoms over the last several days.  They should be made aware of the recurrent fluid buildup around your lung. ?

## 2021-05-30 ENCOUNTER — Ambulatory Visit (HOSPITAL_COMMUNITY)
Admission: RE | Admit: 2021-05-30 | Discharge: 2021-05-30 | Disposition: A | Discharge status: Home / Self Care | Payer: Medicaid Other | Source: Ambulatory Visit | Attending: Internal Medicine | Admitting: Internal Medicine

## 2021-05-30 ENCOUNTER — Other Ambulatory Visit: Payer: Self-pay

## 2021-05-30 ENCOUNTER — Inpatient Hospital Stay: Payer: Medicaid Other

## 2021-05-30 ENCOUNTER — Other Ambulatory Visit: Payer: Self-pay | Admitting: Physician Assistant

## 2021-05-30 ENCOUNTER — Inpatient Hospital Stay: Payer: Medicaid Other | Admitting: Nutrition

## 2021-05-30 ENCOUNTER — Encounter: Payer: Self-pay | Admitting: Internal Medicine

## 2021-05-30 ENCOUNTER — Other Ambulatory Visit: Payer: Self-pay | Admitting: Internal Medicine

## 2021-05-30 ENCOUNTER — Ambulatory Visit (HOSPITAL_COMMUNITY)
Admission: RE | Admit: 2021-05-30 | Discharge: 2021-05-30 | Disposition: A | Discharge status: Home / Self Care | Payer: Medicaid Other | Source: Ambulatory Visit | Attending: Interventional Radiology | Admitting: Interventional Radiology

## 2021-05-30 ENCOUNTER — Other Ambulatory Visit (HOSPITAL_COMMUNITY): Payer: Self-pay | Admitting: Interventional Radiology

## 2021-05-30 VITALS — BP 122/81 | HR 96 | Temp 97.9°F | Resp 18

## 2021-05-30 DIAGNOSIS — Z5111 Encounter for antineoplastic chemotherapy: Secondary | ICD-10-CM | POA: Diagnosis not present

## 2021-05-30 DIAGNOSIS — J90 Pleural effusion, not elsewhere classified: Secondary | ICD-10-CM

## 2021-05-30 DIAGNOSIS — C3492 Malignant neoplasm of unspecified part of left bronchus or lung: Secondary | ICD-10-CM

## 2021-05-30 DIAGNOSIS — Z85118 Personal history of other malignant neoplasm of bronchus and lung: Secondary | ICD-10-CM

## 2021-05-30 LAB — CBC WITH DIFFERENTIAL (CANCER CENTER ONLY)
Abs Immature Granulocytes: 0.02 10*3/uL (ref 0.00–0.07)
Basophils Absolute: 0.1 10*3/uL (ref 0.0–0.1)
Basophils Relative: 1 %
Eosinophils Absolute: 0.1 10*3/uL (ref 0.0–0.5)
Eosinophils Relative: 1 %
HCT: 40.4 % (ref 36.0–46.0)
Hemoglobin: 13.9 g/dL (ref 12.0–15.0)
Immature Granulocytes: 0 %
Lymphocytes Relative: 14 %
Lymphs Abs: 1.5 10*3/uL (ref 0.7–4.0)
MCH: 32.3 pg (ref 26.0–34.0)
MCHC: 34.4 g/dL (ref 30.0–36.0)
MCV: 94 fL (ref 80.0–100.0)
Monocytes Absolute: 0.9 10*3/uL (ref 0.1–1.0)
Monocytes Relative: 9 %
Neutro Abs: 8.1 10*3/uL — ABNORMAL HIGH (ref 1.7–7.7)
Neutrophils Relative %: 75 %
Platelet Count: 243 10*3/uL (ref 150–400)
RBC: 4.3 MIL/uL (ref 3.87–5.11)
RDW: 12.3 % (ref 11.5–15.5)
WBC Count: 10.6 10*3/uL — ABNORMAL HIGH (ref 4.0–10.5)
nRBC: 0 % (ref 0.0–0.2)

## 2021-05-30 LAB — CMP (CANCER CENTER ONLY)
ALT: 14 U/L (ref 0–44)
AST: 14 U/L — ABNORMAL LOW (ref 15–41)
Albumin: 3.8 g/dL (ref 3.5–5.0)
Alkaline Phosphatase: 49 U/L (ref 38–126)
Anion gap: 8 (ref 5–15)
BUN: 12 mg/dL (ref 6–20)
CO2: 27 mmol/L (ref 22–32)
Calcium: 9.4 mg/dL (ref 8.9–10.3)
Chloride: 101 mmol/L (ref 98–111)
Creatinine: 0.77 mg/dL (ref 0.44–1.00)
GFR, Estimated: >60 mL/min (ref >60)
Glucose, Bld: 128 mg/dL — ABNORMAL HIGH (ref 70–99)
Potassium: 3.5 mmol/L (ref 3.5–5.1)
Sodium: 136 mmol/L (ref 135–145)
Total Bilirubin: 0.3 mg/dL (ref 0.3–1.2)
Total Protein: 7.1 g/dL (ref 6.5–8.1)

## 2021-05-30 LAB — TSH: TSH: 0.739 u[IU]/mL (ref 0.350–4.500)

## 2021-05-30 MED ORDER — SODIUM CHLORIDE 0.9 % IV SOLN
750.0000 mg | Freq: Once | INTRAVENOUS | Status: AC
  Administered 2021-05-30: 750 mg via INTRAVENOUS
  Filled 2021-05-30: qty 75

## 2021-05-30 MED ORDER — SODIUM CHLORIDE 0.9 % IV SOLN
1500.0000 mg | Freq: Once | INTRAVENOUS | Status: AC
  Administered 2021-05-30: 1500 mg via INTRAVENOUS
  Filled 2021-05-30: qty 30

## 2021-05-30 MED ORDER — TRILACICLIB DIHYDROCHLORIDE INJECTION 300 MG
240.0000 mg/m2 | Freq: Once | INTRAVENOUS | Status: AC
  Administered 2021-05-30: 600 mg via INTRAVENOUS
  Filled 2021-05-30: qty 40

## 2021-05-30 MED ORDER — SODIUM CHLORIDE 0.9 % IV SOLN
150.0000 mg | Freq: Once | INTRAVENOUS | Status: AC
  Administered 2021-05-30: 150 mg via INTRAVENOUS
  Filled 2021-05-30: qty 150

## 2021-05-30 MED ORDER — SODIUM CHLORIDE 0.9 % IV SOLN
100.0000 mg/m2 | Freq: Once | INTRAVENOUS | Status: AC
  Administered 2021-05-30: 240 mg via INTRAVENOUS
  Filled 2021-05-30: qty 12

## 2021-05-30 MED ORDER — SODIUM CHLORIDE 0.9 % IV SOLN: Freq: Once | INTRAVENOUS | Status: AC

## 2021-05-30 MED ORDER — PALONOSETRON HCL INJECTION 0.25 MG/5ML
0.2500 mg | Freq: Once | INTRAVENOUS | Status: AC
  Administered 2021-05-30: 0.25 mg via INTRAVENOUS
  Filled 2021-05-30: qty 5

## 2021-05-30 MED ORDER — SODIUM CHLORIDE 0.9 % IV SOLN
10.0000 mg | Freq: Once | INTRAVENOUS | Status: AC
  Administered 2021-05-30: 10 mg via INTRAVENOUS
  Filled 2021-05-30: qty 10

## 2021-05-30 MED ORDER — LIDOCAINE HCL 1 % IJ SOLN
INTRAMUSCULAR | Status: AC
  Administered 2021-05-30: 15 mL
  Filled 2021-05-30: qty 20

## 2021-05-30 NOTE — Procedures (Signed)
PROCEDURE SUMMARY: ? ?Successful US guided therapeutic left thoracentesis. ?Yielded 1.4 L of serosanguinous fluid. ?Pt tolerated procedure well. ?No immediate complications. ? ?Specimen not sent for labs. ?CXR ordered. ? ?EBL < 1 mL ? ?Tyson Alias, AGNP ?05/30/2021 ?1:00 PM ? ?   ?

## 2021-05-30 NOTE — Progress Notes (Signed)
56 yo female requesting consult for low sodium diet and adequate oral intake. ?S/P thoracentesis today. ? ?PMH includes Osteoarthritis, morbid obesity, Seizures, tobacco ? ?Medications include Calcium with D, CoQ 10, Vitamin B 12, Lasix, Florastor. ? ?Labs include Glucose 134. ? ?Height: 5'2". ?Weight: 281 pounds on April 23. Weighed 289 pounds on April 14.  ?BMI: 49.78. ? ?Patient reports losing 10 pounds over the last 2 weeks.  ?Reports she helped her mother follow a low sodium diet so is familiar with reading labels.  ?She read that sugar causes cancer to grow. ?Reports she eats a lot of packaged foods and says they are low calorie but high sodium. ?Describes instant oatmeal for breakfast and frozen meal for lunch. States she know she will need to use the crock pot to prepare foods. ?Weight skewed with fluid shifts. ? ?Nutrition Diagnosis: ?Food and Nutrition Related Knowledge Deficit related to [place] and associated treatments as evidenced by patient's self report of knowledge deficit. ? ?Intervention: ?Educated on low sodium diet. Encouraged fresh or frozen (plain) foods and low sodium or no salt canned foods. ?Encouraged spices for seasoning. ?Reviewed healthy snacks. ?Provided nutrition fact sheets. ?Educated on cancer and sugar. ?Reviewed the importance of high protein foods. ? ?Monitoring, Evaluation, Goals: ?Patient will tolerate healthy plant based low sodium diet for minimal wt loss (no more than 2 pounds weekly) or wt stabilization. ? ?Next Visit: ?Monday, May 15 during infusion. ? ? ?

## 2021-05-30 NOTE — Patient Instructions (Addendum)
[facility]  Discharge Instructions: ?Thank you for choosing [facility] to provide your oncology and hematology care.  ? ?If you have a lab appointment with the [facility], please go directly to the [facility] and check in at the registration area. ?  ?Wear comfortable clothing and clothing appropriate for easy access to any Portacath or PICC line.  ? ?We strive to give you quality time with your provider. You may need to reschedule your appointment if you arrive late (15 or more minutes).  Arriving late affects you and other patients whose appointments are after yours.  Also, if you miss three or more appointments without notifying the office, you may be dismissed from the clinic at the provider?s discretion.    ?  ?For prescription refill requests, have your pharmacy contact our office and allow 72 hours for refills to be completed.   ? ?Today you received the following chemotherapy and/or immunotherapy agents: Cosela/Imfinzi/Carboplatin/Etoposide    ?  ?To help prevent nausea and vomiting after your treatment, we encourage you to take your nausea medication as directed. ? ?BELOW ARE SYMPTOMS THAT SHOULD BE REPORTED IMMEDIATELY: ?*FEVER GREATER THAN 100.4 F (38 ?C) OR HIGHER ?*CHILLS OR SWEATING ?*NAUSEA AND VOMITING THAT IS NOT CONTROLLED WITH YOUR NAUSEA MEDICATION ?*UNUSUAL SHORTNESS OF BREATH ?*UNUSUAL BRUISING OR BLEEDING ?*URINARY PROBLEMS (pain or burning when urinating, or frequent urination) ?*BOWEL PROBLEMS (unusual diarrhea, constipation, pain near the anus) ?TENDERNESS IN MOUTH AND THROAT WITH OR WITHOUT PRESENCE OF ULCERS (sore throat, sores in mouth, or a toothache) ?UNUSUAL RASH, SWELLING OR PAIN  ?UNUSUAL VAGINAL DISCHARGE OR ITCHING  ? ?Items with * indicate a potential emergency and should be followed up as soon as possible or go to the Emergency Department if any problems should occur. ? ?Please show the CHEMOTHERAPY ALERT CARD or IMMUNOTHERAPY  ALERT CARD at check-in to the Emergency Department and triage nurse. ? ?Should you have questions after your visit or need to cancel or reschedule your appointment, please contact [facility]  Dept: [phone]  and follow the prompts.  Office hours are 8:00 a.m. to 4:30 p.m. Monday - Friday. Please note that voicemails left after 4:00 p.m. may not be returned until the following business day.  We are closed weekends and major holidays. You have access to a nurse at all times for urgent questions. Please call the main number to the clinic Dept: [phone] and follow the prompts. ? ? ?For any non-urgent questions, you may also contact your provider using MyChart. We now offer e-Visits for anyone 32 and older to request care online for non-urgent symptoms. For details visit [email or web address]. ?  ?Also download the MyChart app! Go to the app store, search "MyChart", open the app, select [facility], and log in with your MyChart username and password. ? ?Due to Covid, a mask is required upon entering the hospital/clinic. If you do not have a mask, one will be given to you upon arrival. For doctor visits, patients may have 1 support person aged 71 or older with them. For treatment visits, patients cannot have anyone with them due to current Covid guidelines and our immunocompromised population.  ? ?Trilaciclib Injection ?What is this medication? ?TRILACICLIB (trye la sye klib) decreases the risk of damage to your red blood cells, white blood cells, and platelets from chemotherapy. ?This medicine may be used for other purposes; ask your health care provider or pharmacist if you have questions. ?COMMON BRAND  NAME(S): COSELA ?What should I tell my care team before I take this medication? ?They need to know if you have any of these conditions: ?an unusual or allergic reaction to trilaciclib, other medicines, foods, dyes, or preservatives ?pregnant or trying to get  pregnant ?breast-feeding ?How should I use this medication? ?This medicine is injected into a vein. It is given by a health care provider in a hospital or clinic setting. ?Talk to your health care provider about the use of this medicine in children. Special care may be needed. ?Overdosage: If you think you have taken too much of this medicine contact a poison control center or emergency room at once. ?NOTE: This medicine is only for you. Do not share this medicine with others. ?What if I miss a dose? ?Keep appointments for follow-up doses. It is important to not miss your dose. Call your health care provider if you are unable to keep an appointment. ?What may interact with this medication? ?cisplatin ?dalfampridine ?dofetilide ?metformin ?This list may not describe all possible interactions. Give your health care provider a list of all the medicines, herbs, non-prescription drugs, or dietary supplements you use. Also tell them if you smoke, drink alcohol, or use illegal drugs. Some items may interact with your medicine. ?What should I watch for while using this medication? ?Your condition will be monitored carefully while you are receiving this medicine. ?Do not become pregnant while taking this medicine or for at least 3 weeks after stopping it. Women should inform their health care provider if they wish to become pregnant or think they might be pregnant. There is a potential for serious harm to an unborn child. Talk to your health care provider for more information. Do not breast-feed an infant while taking this medicine or for at least 3 weeks after stopping it. ?This medicine may make it more difficult to get pregnant. Talk to your health care provider if you are concerned about your fertility. ?What side effects may I notice from receiving this medication? ?Side effects that you should report to your doctor or health care professional as soon as possible: ?allergic reactions (skin rash; itching or hives;  swelling of the face, lips, or tongue) ?blood clot (chest pain; shortness of breath; pain, swelling, or warmth in the leg) ?cough ?pain, redness, or irritation at site where injected ?trouble breathing ?Side effects that usually do not require medical attention (report these to your doctor or health care professional if they continue or are bothersome): ?edema (sudden weight gain; swelling of the ankles, feet, hands or other unusual swelling) ?headache ?stomach pain ?unusually weak or tired ?This list may not describe all possible side effects. Call your doctor for medical advice about side effects. You may report side effects to FDA at 1-800-FDA-1088. ?Where should I keep my medication? ?This medicine is given in a hospital or clinic. It will not be stored at home. ?NOTE: This sheet is a summary. It may not cover all possible information. If you have questions about this medicine, talk to your doctor, pharmacist, or health care provider. ?? 2023 Elsevier/Gold Standard (2020-12-24 00:00:00) ? ?Durvalumab injection ?What is this medication? ?DURVALUMAB (dur VAL ue mab) is a monoclonal antibody. It is used to treat lung cancer. ?This medicine may be used for other purposes; ask your health care provider or pharmacist if you have questions. ?COMMON BRAND NAME(S): IMFINZI ?What should I tell my care team before I take this medication? ?They need to know if you have any of these conditions: ?autoimmune  diseases like Crohn's disease, ulcerative colitis, or lupus ?have had or planning to have an allogeneic stem cell transplant (uses someone else's stem cells) ?history of organ transplant ?history of radiation to the chest ?nervous system problems like myasthenia gravis or Guillain-Barre syndrome ?an unusual or allergic reaction to durvalumab, other medicines, foods, dyes, or preservatives ?pregnant or trying to get pregnant ?breast-feeding ?How should I use this medication? ?This medicine is for infusion into a vein. It is  given by a health care professional in a hospital or clinic setting. ?A special MedGuide will be given to you before each treatment. Be sure to read this information carefully each time. ?Talk to your pediatrician regarding th

## 2021-05-31 ENCOUNTER — Encounter: Payer: Self-pay | Admitting: Internal Medicine

## 2021-05-31 ENCOUNTER — Encounter (HOSPITAL_COMMUNITY): Payer: Self-pay

## 2021-05-31 ENCOUNTER — Inpatient Hospital Stay: Payer: Medicaid Other

## 2021-05-31 ENCOUNTER — Other Ambulatory Visit: Payer: Self-pay

## 2021-05-31 ENCOUNTER — Ambulatory Visit (HOSPITAL_COMMUNITY)
Admission: RE | Admit: 2021-05-31 | Discharge: 2021-05-31 | Disposition: A | Discharge status: Home / Self Care | Payer: Medicaid Other | Source: Ambulatory Visit | Attending: Internal Medicine | Admitting: Internal Medicine

## 2021-05-31 ENCOUNTER — Other Ambulatory Visit: Payer: Self-pay | Admitting: Internal Medicine

## 2021-05-31 VITALS — BP 127/86 | HR 90 | Temp 97.8°F | Resp 18

## 2021-05-31 DIAGNOSIS — Z452 Encounter for adjustment and management of vascular access device: Secondary | ICD-10-CM | POA: Diagnosis not present

## 2021-05-31 DIAGNOSIS — Z5111 Encounter for antineoplastic chemotherapy: Secondary | ICD-10-CM | POA: Diagnosis not present

## 2021-05-31 DIAGNOSIS — J910 Malignant pleural effusion: Secondary | ICD-10-CM | POA: Insufficient documentation

## 2021-05-31 DIAGNOSIS — C3492 Malignant neoplasm of unspecified part of left bronchus or lung: Secondary | ICD-10-CM | POA: Diagnosis not present

## 2021-05-31 DIAGNOSIS — C3490 Malignant neoplasm of unspecified part of unspecified bronchus or lung: Secondary | ICD-10-CM | POA: Diagnosis not present

## 2021-05-31 HISTORY — PX: IR IMAGING GUIDED PORT INSERTION: IMG5740

## 2021-05-31 MED ORDER — MIDAZOLAM HCL 2 MG/2ML IJ SOLN
INTRAMUSCULAR | Status: AC | PRN
Start: 2021-05-31 — End: 2021-05-31
  Administered 2021-05-31: 1 mg via INTRAVENOUS

## 2021-05-31 MED ORDER — HEPARIN SOD (PORK) LOCK FLUSH 100 UNIT/ML IV SOLN
500.0000 [IU] | Freq: Once | INTRAVENOUS | Status: AC | PRN
  Administered 2021-05-31: 500 [IU]

## 2021-05-31 MED ORDER — LIDOCAINE HCL (PF) 1 % IJ SOLN
INTRAMUSCULAR | Status: AC | PRN
  Administered 2021-05-31: 10 mL via INTRADERMAL

## 2021-05-31 MED ORDER — SODIUM CHLORIDE 0.9 % IV SOLN: Freq: Once | INTRAVENOUS | Status: AC

## 2021-05-31 MED ORDER — SODIUM CHLORIDE 0.9 % IV SOLN: INTRAVENOUS | Status: DC

## 2021-05-31 MED ORDER — TRILACICLIB DIHYDROCHLORIDE INJECTION 300 MG
240.0000 mg/m2 | Freq: Once | INTRAVENOUS | Status: AC
  Administered 2021-05-31: 600 mg via INTRAVENOUS
  Filled 2021-05-31: qty 40

## 2021-05-31 MED ORDER — SODIUM CHLORIDE 0.9 % IV SOLN
100.0000 mg/m2 | Freq: Once | INTRAVENOUS | Status: AC
  Administered 2021-05-31: 240 mg via INTRAVENOUS
  Filled 2021-05-31: qty 12

## 2021-05-31 MED ORDER — MIDAZOLAM HCL 2 MG/2ML IJ SOLN: INTRAMUSCULAR | Status: AC | PRN

## 2021-05-31 MED ORDER — LIDOCAINE HCL 1 % IJ SOLN
INTRAMUSCULAR | Status: AC
  Filled 2021-05-31: qty 20

## 2021-05-31 MED ORDER — MIDAZOLAM HCL 2 MG/2ML IJ SOLN
INTRAMUSCULAR | Status: AC | PRN
  Administered 2021-05-31: .5 mg via INTRAVENOUS

## 2021-05-31 MED ORDER — SODIUM CHLORIDE 0.9 % IV SOLN
10.0000 mg | Freq: Once | INTRAVENOUS | Status: AC
  Administered 2021-05-31: 10 mg via INTRAVENOUS
  Filled 2021-05-31: qty 10

## 2021-05-31 MED ORDER — LIDOCAINE-EPINEPHRINE 1 %-1:100000 IJ SOLN
INTRAMUSCULAR | Status: AC
  Filled 2021-05-31: qty 1

## 2021-05-31 MED ORDER — LIDOCAINE-EPINEPHRINE 1 %-1:100000 IJ SOLN
INTRAMUSCULAR | Status: AC | PRN
  Administered 2021-05-31: 10 mL via INTRADERMAL

## 2021-05-31 MED ORDER — FENTANYL CITRATE (PF) 100 MCG/2ML IJ SOLN
INTRAMUSCULAR | Status: AC
  Filled 2021-05-31: qty 2

## 2021-05-31 MED ORDER — FENTANYL CITRATE (PF) 100 MCG/2ML IJ SOLN
INTRAMUSCULAR | Status: AC | PRN
  Administered 2021-05-31: 50 ug via INTRAVENOUS

## 2021-05-31 MED ORDER — SODIUM CHLORIDE 0.9% FLUSH
10.0000 mL | INTRAVENOUS | Status: DC | PRN
  Administered 2021-05-31: 10 mL

## 2021-05-31 MED ORDER — FENTANYL CITRATE (PF) 100 MCG/2ML IJ SOLN
INTRAMUSCULAR | Status: AC | PRN
Start: 2021-05-31 — End: 2021-05-31
  Administered 2021-05-31: 50 ug via INTRAVENOUS

## 2021-05-31 MED ORDER — HEPARIN SOD (PORK) LOCK FLUSH 100 UNIT/ML IV SOLN
INTRAVENOUS | Status: AC
  Filled 2021-05-31: qty 5

## 2021-05-31 MED ORDER — MIDAZOLAM HCL 2 MG/2ML IJ SOLN
INTRAMUSCULAR | Status: AC | PRN
  Administered 2021-05-31: 1 mg via INTRAVENOUS

## 2021-05-31 MED ORDER — MIDAZOLAM HCL 2 MG/2ML IJ SOLN
INTRAMUSCULAR | Status: AC
  Filled 2021-05-31: qty 4

## 2021-05-31 NOTE — Progress Notes (Signed)
Called patient to emphasize instructions printed on gift card envelope to (USE AS CREDIT. DO NOT USE AT PUMP, TAKE INSIDE AND RUN AS CREDIT AND ITS ALREADY ACTIVATED). ?  ?She verbalized understanding and will call me when she gets home to go over other grant expense details. She has my card with my contact information. ?

## 2021-05-31 NOTE — Patient Instructions (Signed)
[facility]   ?Discharge Instructions: ?Thank you for choosing [facility] to provide your oncology and hematology care.  ? ?If you have a lab appointment with the [facility], please go directly to the [facility] and check in at the registration area. ?  ?Wear comfortable clothing and clothing appropriate for easy access to any Portacath or PICC line.  ? ?We strive to give you quality time with your provider. You may need to reschedule your appointment if you arrive late (15 or more minutes).  Arriving late affects you and other patients whose appointments are after yours.  Also, if you miss three or more appointments without notifying the office, you may be dismissed from the clinic at the provider?s discretion.    ?  ?For prescription refill requests, have your pharmacy contact our office and allow 72 hours for refills to be completed.   ? ?Today you received the following chemotherapy and/or immunotherapy agents: cosela and etoposide    ?  ?To help prevent nausea and vomiting after your treatment, we encourage you to take your nausea medication as directed. ? ?BELOW ARE SYMPTOMS THAT SHOULD BE REPORTED IMMEDIATELY: ?*FEVER GREATER THAN 100.4 F (38 ?C) OR HIGHER ?*CHILLS OR SWEATING ?*NAUSEA AND VOMITING THAT IS NOT CONTROLLED WITH YOUR NAUSEA MEDICATION ?*UNUSUAL SHORTNESS OF BREATH ?*UNUSUAL BRUISING OR BLEEDING ?*URINARY PROBLEMS (pain or burning when urinating, or frequent urination) ?*BOWEL PROBLEMS (unusual diarrhea, constipation, pain near the anus) ?TENDERNESS IN MOUTH AND THROAT WITH OR WITHOUT PRESENCE OF ULCERS (sore throat, sores in mouth, or a toothache) ?UNUSUAL RASH, SWELLING OR PAIN  ?UNUSUAL VAGINAL DISCHARGE OR ITCHING  ? ?Items with * indicate a potential emergency and should be followed up as soon as possible or go to the Emergency Department if any problems should occur. ? ?Please show the CHEMOTHERAPY ALERT CARD or IMMUNOTHERAPY ALERT CARD at  check-in to the Emergency Department and triage nurse. ? ?Should you have questions after your visit or need to cancel or reschedule your appointment, please contact [facility]  Dept: [phone]  and follow the prompts.  Office hours are 8:00 a.m. to 4:30 p.m. Monday - Friday. Please note that voicemails left after 4:00 p.m. may not be returned until the following business day.  We are closed weekends and major holidays. You have access to a nurse at all times for urgent questions. Please call the main number to the clinic Dept: [phone] and follow the prompts. ? ? ?For any non-urgent questions, you may also contact your provider using MyChart. We now offer e-Visits for anyone 77 and older to request care online for non-urgent symptoms. For details visit [email or web address]. ?  ?Also download the MyChart app! Go to the app store, search "MyChart", open the app, select [facility], and log in with your MyChart username and password. ? ?Due to Covid, a mask is required upon entering the hospital/clinic. If you do not have a mask, one will be given to you upon arrival. For doctor visits, patients may have 1 support person aged 41 or older with them. For treatment visits, patients cannot have anyone with them due to current Covid guidelines and our immunocompromised population.  ? ?

## 2021-05-31 NOTE — Progress Notes (Signed)
Patient called when she got home to discuss grant acceptable expenses in detail. She verbalized understanding. ? ?She had questions regarding orange card [facility] application. Advised her to submit application to information provided for this application. Advised to wait to submit [place] application after she has received a determination from orange card and then apply for Medicaid if denied, if denied Medicaid then she would apply for [place] to determine if she qualifies more than the 60% uninsured discount. ? ?Advised she may receive a call regarding drug replacement if any of her treatment drugs qualify for assistance while uninsured. ? ?She has my card for any additional financial questions or concerns. ?

## 2021-05-31 NOTE — Progress Notes (Signed)
Patient provided document and  signed [place] paperwork at registration today to complete grant process. ? ?She received a gift card today from her grant. ? ?She has copy of approval letter and grant expense sheet along with the Outpatient pharmacy information in a green folder as well as my card for any additional financial questions or concerns.  ?

## 2021-05-31 NOTE — H&P (Signed)
? ? ? ?Referring Physician(s): ?Mohamed,Mohamed ? ?Supervising Physician: Markus Daft ? ?Patient Status:  [facility] OP ? ?Chief Complaint: ? ?"I'm getting a port a cath placed" ? ?Subjective: ?Patient known to IR service from multiple left thoracenteses, latest on 05/30/2021.  She has a history of osteoarthritis, morbid obesity, seizures, tobacco use and now with recently diagnosed extensive stage small cell left lung cancer with malignant left pleural effusion.  She is scheduled today for Port-A-Cath placement to assist with treatment.  She currently denies fever, headache, chest pain, abdominal/back pain, nausea, vomiting or bleeding.  She does have dyspnea, occasional cough, occasional dizziness. ? ?Past Medical History:  ?Diagnosis Date  ? Arthritis   ? Incisional hernia without mention of obstruction or gangrene   ? Morbid obesity ([place])   ? Seizures ([place])   ? last one was in 1985  ? ?Past Surgical History:  ?Procedure Laterality Date  ? ABDOMINAL HYSTERECTOMY  2010  ? at [facility]  ? BREAST CYST ASPIRATION Right 2010  ? BREAST CYST EXCISION  1994  ? right breast  ? BREAST EXCISIONAL BIOPSY Right   ? benign cyst  ? [facility]  ? LEFT HEART CATHETERIZATION WITH CORONARY ANGIOGRAM N/A 06/14/2011  ? Procedure: LEFT HEART CATHETERIZATION WITH CORONARY ANGIOGRAM;  Surgeon: Candee Furbish, MD;  [facility] CATH LAB;  Service: Cardiovascular;  Laterality: N/A;  ? VAGINA SURGERY  2007  ? ? ? ? ?Allergies: ?Codeine ? ?Medications: ?Prior to Admission medications   ?Medication Sig Start Date End Date Taking? Authorizing Provider  ?lisinopril-hydrochlorothiazide (PRINZIDE,ZESTORETIC) 20-25 MG tablet TAKE 1 TABLET BY MOUTH EVERY DAY ?Patient taking differently: Take 1 tablet by mouth daily. 04/24/18  Yes Jacelyn Pi, Lilia Argue, MD  ?CALCIUM-VITAMIN D PO Take 2,000 mg by mouth daily.  ?Patient not taking: Reported on 05/20/2021    [provider]  ?cholecalciferol (VITAMIN D) 1000 units tablet Take 1,000 Units by mouth  daily. ?Patient not taking: Reported on 05/20/2021    [provider]  ?co-enzyme Q-10 50 MG capsule Take 50 mg by mouth daily. ?Patient not taking: Reported on 05/20/2021    [provider]  ?Cyanocobalamin (VITAMIN B-12 PO) Take 1 tablet by mouth daily. ?Patient not taking: Reported on 05/20/2021    [provider]  ?Desoximetasone 0.05 % OINT Apply 1 application. topically daily as needed (For skin rash). 04/07/17   [provider]  ?furosemide (LASIX) 20 MG tablet Take 1 tablet (20 mg total) by mouth daily as needed (weight gain of 3lbs in 1 day or 5lbs in 1 week). ?Patient not taking: Reported on 05/20/2021 05/15/21 05/15/22  Lavina Hamman, MD  ?hydrOXYzine (ATARAX) 10 MG tablet Take 1 tablet (10 mg total) by mouth 3 (three) times daily as needed. ?Patient not taking: Reported on 05/24/2021 05/20/21   [name], NP  ?lidocaine-prilocaine (EMLA) cream Apply to the Port-A-Cath site 30 minutes before chemotherapy 05/24/21   Curt Bears, MD  ?prochlorperazine (COMPAZINE) 10 MG tablet Take 1 tablet (10 mg total) by mouth every 6 (six) hours as needed for nausea or vomiting. 05/24/21   Curt Bears, MD  ? ? ? ?Vital Signs: ?BP (!) 148/100 (BP Location: Right Arm)   Pulse 84   Temp (!) 97.5 ?F (36.4 ?C) (Oral)   Resp 18   SpO2 95%  ? ?Physical Exam awake, alert.  Chest with  diminished breath sounds left base, right clear.  Heart with regular rate  and rhythm.  Abdomen obese, soft, positive  bowel sounds, nontender.  Extremities with full range of motion. ? ?Imaging: ?DG Chest 1 View ? ?Result Date: 05/30/2021 ?CLINICAL DATA:  Post left thoracentesis EXAM: CHEST  1 VIEW COMPARISON:  Chest radiograph from one day prior. FINDINGS: Insert: Stable cardiomediastinal silhouette with normal heart size. No pneumothorax. Moderate left pleural effusion, decreased. No right pleural effusion. No overt pulmonary edema. Left lung base dense consolidation, improved. IMPRESSION: 1. No  pneumothorax. Moderate left pleural effusion, decreased. 2. Improved left lung base consolidation. Electronically Signed   By: Ilona Sorrel M.D.   On: 05/30/2021 13:20  ? ?DG Chest 2 View ? ?Result Date: 05/29/2021 ?CLINICAL DATA:  Shortness of breath. EXAM: CHEST - 2 VIEW COMPARISON:  May 15, 2021 FINDINGS: The cardiac silhouette is partially obscured by large left pleural effusion, increased in size from the most recent comparison study. Streaky interstitial opacities in both lungs. Compressive atelectasis in the left lung. IMPRESSION: 1. Large left pleural effusion, increased in size from the most recent comparison study. 2. Streaky interstitial opacities in both lungs. Electronically Signed   By: Fidela Salisbury M.D.   On: 05/29/2021 15:29  ? ?US THORACENTESIS ASP PLEURAL SPACE W/IMG GUIDE ? ?Result Date: 05/30/2021 ?INDICATION: Patient with history of small cell carcinoma of left lung with recurrent left pleural effusion. Request for therapeutic left thoracentesis. EXAM: ULTRASOUND GUIDED THERAPEUTIC LEFT THORACENTESIS MEDICATIONS: 10 mL 1 % lidocaine COMPLICATIONS: None immediate. PROCEDURE: An ultrasound guided thoracentesis was thoroughly discussed with the patient and questions answered. The benefits, risks, alternatives and complications were also discussed. The patient understands and wishes to proceed with the procedure. Written consent was obtained. Ultrasound was performed to localize and mark an adequate pocket of fluid in the left chest. The area was then prepped and draped in the normal sterile fashion. 1% Lidocaine was used for local anesthesia. Under ultrasound guidance a 6 Fr Safe-T-Centesis catheter was introduced. Thoracentesis was performed. The catheter was removed and a dressing applied. FINDINGS: A total of approximately 1.4 L of serosanguineous fluid was removed. IMPRESSION: Successful ultrasound guided left thoracentesis yielding 1.4 L of pleural fluid. Read by: Narda Rutherford, AGNP-BC  Electronically Signed   By: Jacqulynn Cadet M.D.   On: 05/30/2021 14:36   ? ?Labs: ? ?CBC: ?Recent Labs  ?  05/13/21 ?1644 05/24/21 ?1340 05/30/21 ?1108  ?WBC 9.8 9.8 10.6*  ?HGB 14.5 14.6 13.9  ?HCT 42.6 42.1 40.4  ?PLT 267 301 243  ? ? ?COAGS: ?No results for input(s): INR, APTT in the last 8760 hours. ? ?BMP: ?Recent Labs  ?  05/14/21 ?1249 05/24/21 ?1340 05/29/21 ?1510 05/30/21 ?1108  ?NA 138 135 136 136  ?K 3.9 3.9 3.7 3.5  ?CL 105 99 104 101  ?CO2 25 29 24 27   ?GLUCOSE 97 134* 141* 128*  ?BUN 8 8 13 12   ?CALCIUM 9.2 9.5 9.3 9.4  ?CREATININE 0.74 0.81 0.74 0.77  ?GFRNONAA >60 >60 >60 >60  ? ? ?LIVER FUNCTION TESTS: ?Recent Labs  ?  05/24/21 ?1340 05/30/21 ?1108  ?BILITOT 0.5 0.3  ?AST 17 14*  ?ALT 18 14  ?ALKPHOS 50 49  ?PROT 7.3 7.1  ?ALBUMIN 3.9 3.8  ? ? ?Assessment and Plan: ?Patient known to IR service from multiple left thoracenteses, latest on 05/30/2021.  She has a history of osteoarthritis, morbid obesity, seizures, tobacco use and now with recently diagnosed extensive stage small cell left lung cancer with malignant left pleural effusion.  She is scheduled today for Port-A-Cath placement to assist with treatment. Risks  and benefits of image guided port-a-catheter placement was discussed with the patient including, but not limited to bleeding, infection, pneumothorax, or fibrin sheath development and need for additional procedures. ? ?All of the patient's questions were answered, patient is agreeable to proceed. ?Consent signed and in chart. ? ? ? ?Electronically Signed: ?Autumn Messing, PA-C ?05/31/2021, 8:51 AM ? ? ?I spent a total of 25 minutes at the the patient's bedside AND on the patient's hospital floor or unit, greater than 50% of which was counseling/coordinating care for Port-A-Cath placement ? ? ? ? ?

## 2021-05-31 NOTE — Progress Notes (Signed)
Patient called me from infusion on 05/30/21 after receiving my card per my request. ? ?Introduced myself as Arboriculturist and to offer available resources. ? ?Discussed one-time $1000 Radio broadcast assistant to assist with personal expenses while going through treatment. Advised what is needed to apply. Patient will submit to registration at her earliest convenience to scan and email to me and will be given grant paperwork to complete. Advised to call me sometime after appointment to go over in detail expenses and how they are covered. She verbalized understanding. ? ?She has my card for any additional financial questions or concerns. ?

## 2021-05-31 NOTE — Procedures (Signed)
Interventional Radiology Procedure: ? ? ?Indications: Small cell lung cancer ? ?Procedure: Port placement ? ?Findings: Right jugular port, tip at SVC/RA junction ? ?Complications: None ?    ?EBL: Minimal, less than 10 ml ? ?Plan: Port is accessed and ready to be used today.  ? ?Stephan Minister. Anselm Pancoast, MD  ?Pager: [phone] ? ?  ?

## 2021-06-01 ENCOUNTER — Inpatient Hospital Stay: Payer: Medicaid Other

## 2021-06-01 VITALS — BP 131/84 | HR 88 | Temp 97.6°F | Resp 20

## 2021-06-01 DIAGNOSIS — C3492 Malignant neoplasm of unspecified part of left bronchus or lung: Secondary | ICD-10-CM

## 2021-06-01 DIAGNOSIS — Z5111 Encounter for antineoplastic chemotherapy: Secondary | ICD-10-CM | POA: Diagnosis not present

## 2021-06-01 MED ORDER — HEPARIN SOD (PORK) LOCK FLUSH 100 UNIT/ML IV SOLN
500.0000 [IU] | Freq: Once | INTRAVENOUS | Status: AC | PRN
  Administered 2021-06-01: 500 [IU]

## 2021-06-01 MED ORDER — SODIUM CHLORIDE 0.9 % IV SOLN: Freq: Once | INTRAVENOUS | Status: AC

## 2021-06-01 MED ORDER — SODIUM CHLORIDE 0.9 % IV SOLN
10.0000 mg | Freq: Once | INTRAVENOUS | Status: AC
  Administered 2021-06-01: 10 mg via INTRAVENOUS
  Filled 2021-06-01: qty 10

## 2021-06-01 MED ORDER — SODIUM CHLORIDE 0.9% FLUSH
10.0000 mL | INTRAVENOUS | Status: DC | PRN
  Administered 2021-06-01: 10 mL

## 2021-06-01 MED ORDER — TRILACICLIB DIHYDROCHLORIDE INJECTION 300 MG
240.0000 mg/m2 | Freq: Once | INTRAVENOUS | Status: AC
  Administered 2021-06-01: 600 mg via INTRAVENOUS
  Filled 2021-06-01: qty 40

## 2021-06-01 MED ORDER — SODIUM CHLORIDE 0.9 % IV SOLN
100.0000 mg/m2 | Freq: Once | INTRAVENOUS | Status: AC
  Administered 2021-06-01: 240 mg via INTRAVENOUS
  Filled 2021-06-01: qty 12

## 2021-06-01 NOTE — Patient Instructions (Signed)
[facility]  Discharge Instructions: ?Thank you for choosing [facility] to provide your oncology and hematology care.  ? ?If you have a lab appointment with the [facility], please go directly to the [facility] and check in at the registration area. ?  ?Wear comfortable clothing and clothing appropriate for easy access to any Portacath or PICC line.  ? ?We strive to give you quality time with your provider. You may need to reschedule your appointment if you arrive late (15 or more minutes).  Arriving late affects you and other patients whose appointments are after yours.  Also, if you miss three or more appointments without notifying the office, you may be dismissed from the clinic at the provider?s discretion.    ?  ?For prescription refill requests, have your pharmacy contact our office and allow 72 hours for refills to be completed.   ? ?Today you received the following chemotherapy and/or immunotherapy agents: Cosela/Etoposide    ?  ?To help prevent nausea and vomiting after your treatment, we encourage you to take your nausea medication as directed. ? ?BELOW ARE SYMPTOMS THAT SHOULD BE REPORTED IMMEDIATELY: ?*FEVER GREATER THAN 100.4 F (38 ?C) OR HIGHER ?*CHILLS OR SWEATING ?*NAUSEA AND VOMITING THAT IS NOT CONTROLLED WITH YOUR NAUSEA MEDICATION ?*UNUSUAL SHORTNESS OF BREATH ?*UNUSUAL BRUISING OR BLEEDING ?*URINARY PROBLEMS (pain or burning when urinating, or frequent urination) ?*BOWEL PROBLEMS (unusual diarrhea, constipation, pain near the anus) ?TENDERNESS IN MOUTH AND THROAT WITH OR WITHOUT PRESENCE OF ULCERS (sore throat, sores in mouth, or a toothache) ?UNUSUAL RASH, SWELLING OR PAIN  ?UNUSUAL VAGINAL DISCHARGE OR ITCHING  ? ?Items with * indicate a potential emergency and should be followed up as soon as possible or go to the Emergency Department if any problems should occur. ? ?Please show the CHEMOTHERAPY ALERT CARD or IMMUNOTHERAPY ALERT CARD at  check-in to the Emergency Department and triage nurse. ? ?Should you have questions after your visit or need to cancel or reschedule your appointment, please contact [facility]  Dept: [phone]  and follow the prompts.  Office hours are 8:00 a.m. to 4:30 p.m. Monday - Friday. Please note that voicemails left after 4:00 p.m. may not be returned until the following business day.  We are closed weekends and major holidays. You have access to a nurse at all times for urgent questions. Please call the main number to the clinic Dept: [phone] and follow the prompts. ? ? ?For any non-urgent questions, you may also contact your provider using MyChart. We now offer e-Visits for anyone 30 and older to request care online for non-urgent symptoms. For details visit [email or web address]. ?  ?Also download the MyChart app! Go to the app store, search "MyChart", open the app, select [facility], and log in with your MyChart username and password. ? ?Due to Covid, a mask is required upon entering the hospital/clinic. If you do not have a mask, one will be given to you upon arrival. For doctor visits, patients may have 1 support person aged 17 or older with them. For treatment visits, patients cannot have anyone with them due to current Covid guidelines and our immunocompromised population.  ? ?

## 2021-06-02 ENCOUNTER — Ambulatory Visit (HOSPITAL_COMMUNITY)
Admission: RE | Admit: 2021-06-02 | Discharge: 2021-06-02 | Disposition: A | Discharge status: Home / Self Care | Payer: Self-pay | Source: Ambulatory Visit | Attending: Internal Medicine | Admitting: Internal Medicine

## 2021-06-02 ENCOUNTER — Encounter (HOSPITAL_COMMUNITY): Payer: Self-pay

## 2021-06-02 ENCOUNTER — Telehealth: Payer: Self-pay

## 2021-06-02 ENCOUNTER — Ambulatory Visit: Payer: Self-pay

## 2021-06-02 ENCOUNTER — Encounter: Payer: Self-pay | Admitting: Internal Medicine

## 2021-06-02 DIAGNOSIS — C3490 Malignant neoplasm of unspecified part of unspecified bronchus or lung: Secondary | ICD-10-CM

## 2021-06-02 NOTE — Telephone Encounter (Signed)
?  Chief Complaint: Weight gain - possible fluid retention ?Symptoms: 4/22/20223 - 283lbs ?05/29/2021 - 281 lbs ?05/30/2021 - 284 lbs ?05/31/2021 - 285 lbs ?06/01/2021 - 287 lbs ?Took lasix 06/01/2021. ?06/02/2021 - 286 lbs ?Frequency: Ongoing ?Pertinent Negatives: Patient denies SOB. ?Disposition: [] ED /[] Urgent Care (no appt availability in office) / [] Appointment(In office/virtual)/ []  [facility]/ [] Home Care/ [] Refused Recommended Disposition /[] [facility]/ [x]  Follow-up with PCP ?Additional Notes: Pt has no PCP and medication was prescribed by oncology. Referred pt back to oncology for advice. Pt was seen this week and had fluid drained from her lung. Best resource for this question is Dr. Julien Nordmann Pt given phone number to Dr. Earlie Server  - also discussed "[facility]" messaging.  ? ?Reason for Disposition ? [1] Caller has NON-URGENT medicine question about med that PCP prescribed AND [2] triager unable to answer question ? ?Answer Assessment - Initial Assessment Questions ?1. NAME of MEDICATION: "What medicine are you calling about?" ?    Lasix ?2. QUESTION: "What is your question?" (e.g., double dose of medicine, side effect) ?    Should I take another pill today ?3. PRESCRIBING HCP: "Who prescribed it?" Reason: if prescribed by specialist, call should be referred to that group. ?    Dr. Julien Nordmann ?4. SYMPTOMS: "Do you have any symptoms?" ?    Weight gain ?5. SEVERITY: If symptoms are present, ask "Are they mild, moderate or severe?" ?     ?6. PREGNANCY:  "Is there any chance that you are pregnant?" "When was your last menstrual period?" ?    na ? ?Protocols used: Medication Question Call-A-AH ? ?

## 2021-06-02 NOTE — Telephone Encounter (Signed)
Pt LM stating she has questions regarding her water pill. ? ?I have called the pt back and left a detailed message advising the Lasix was rx'd by Dr. Posey Pronto and to please contact his office to discuss proper usage of her cardiac medication. ?

## 2021-06-02 NOTE — Progress Notes (Signed)
Unsuccessful attempt at MRI. Pt too claustrophobic. Pt given centralized scheduling number and instructed to call once she gets in touch with [facility] RN and gets an order for claustrophobia meds. Pt informed we have appointments open this Sat and Sunday to help her get her MRI done in a timely manner. Pt verbalized understanding.  ?

## 2021-06-03 ENCOUNTER — Telehealth: Payer: Self-pay | Admitting: Internal Medicine

## 2021-06-03 ENCOUNTER — Other Ambulatory Visit: Payer: Self-pay | Admitting: Physician Assistant

## 2021-06-03 ENCOUNTER — Telehealth: Payer: Self-pay | Admitting: Medical Oncology

## 2021-06-03 ENCOUNTER — Encounter (HOSPITAL_COMMUNITY): Payer: Self-pay

## 2021-06-03 ENCOUNTER — Other Ambulatory Visit: Payer: Self-pay | Admitting: Medical Oncology

## 2021-06-03 DIAGNOSIS — C3490 Malignant neoplasm of unspecified part of unspecified bronchus or lung: Secondary | ICD-10-CM

## 2021-06-03 NOTE — Telephone Encounter (Addendum)
MRI procedure unsuccessful ?Could not get MRI because " they put a mask on my face and I was too claustrophobic". ? ? "I need something to knock it me out". ?Scan has not been r/s yet. ? ?Pt wants sedation prior to MRI. She understands she may need an IV for that. ?

## 2021-06-03 NOTE — Telephone Encounter (Signed)
Called patient regarding upcoming appointment, patient is notified. °

## 2021-06-06 ENCOUNTER — Ambulatory Visit (HOSPITAL_COMMUNITY)
Admission: RE | Admit: 2021-06-06 | Discharge: 2021-06-06 | Disposition: A | Discharge status: Home / Self Care | Payer: Medicaid Other | Source: Ambulatory Visit | Attending: Internal Medicine | Admitting: Internal Medicine

## 2021-06-06 DIAGNOSIS — C7800 Secondary malignant neoplasm of unspecified lung: Secondary | ICD-10-CM | POA: Insufficient documentation

## 2021-06-06 DIAGNOSIS — Z95828 Presence of other vascular implants and grafts: Secondary | ICD-10-CM | POA: Insufficient documentation

## 2021-06-06 LAB — GLUCOSE, CAPILLARY: Glucose-Capillary: 131 mg/dL — ABNORMAL HIGH (ref 70–99)

## 2021-06-06 MED ORDER — FLUDEOXYGLUCOSE F - 18 (FDG) INJECTION
14.0000 | Freq: Once | INTRAVENOUS | Status: AC
  Administered 2021-06-06: 13.85 via INTRAVENOUS

## 2021-06-07 ENCOUNTER — Inpatient Hospital Stay: Payer: Medicaid Other | Attending: Internal Medicine | Admitting: Internal Medicine

## 2021-06-07 ENCOUNTER — Encounter: Payer: Self-pay | Admitting: Licensed Clinical Social Worker

## 2021-06-07 ENCOUNTER — Other Ambulatory Visit: Payer: Self-pay | Admitting: Medical Oncology

## 2021-06-07 ENCOUNTER — Inpatient Hospital Stay: Payer: Medicaid Other

## 2021-06-07 ENCOUNTER — Other Ambulatory Visit: Payer: Self-pay

## 2021-06-07 ENCOUNTER — Encounter: Payer: Self-pay | Admitting: *Deleted

## 2021-06-07 VITALS — BP 122/81 | HR 104 | Temp 96.3°F | Resp 17 | Wt 282.1 lb

## 2021-06-07 DIAGNOSIS — J910 Malignant pleural effusion: Secondary | ICD-10-CM | POA: Diagnosis not present

## 2021-06-07 DIAGNOSIS — Z5111 Encounter for antineoplastic chemotherapy: Secondary | ICD-10-CM | POA: Diagnosis not present

## 2021-06-07 DIAGNOSIS — C3412 Malignant neoplasm of upper lobe, left bronchus or lung: Secondary | ICD-10-CM | POA: Insufficient documentation

## 2021-06-07 DIAGNOSIS — C3492 Malignant neoplasm of unspecified part of left bronchus or lung: Secondary | ICD-10-CM | POA: Diagnosis not present

## 2021-06-07 DIAGNOSIS — R590 Localized enlarged lymph nodes: Secondary | ICD-10-CM | POA: Diagnosis not present

## 2021-06-07 DIAGNOSIS — C7951 Secondary malignant neoplasm of bone: Secondary | ICD-10-CM | POA: Diagnosis not present

## 2021-06-07 DIAGNOSIS — Z5112 Encounter for antineoplastic immunotherapy: Secondary | ICD-10-CM

## 2021-06-07 DIAGNOSIS — Z95828 Presence of other vascular implants and grafts: Secondary | ICD-10-CM

## 2021-06-07 DIAGNOSIS — Z79899 Other long term (current) drug therapy: Secondary | ICD-10-CM | POA: Insufficient documentation

## 2021-06-07 LAB — CBC WITH DIFFERENTIAL (CANCER CENTER ONLY)
Abs Immature Granulocytes: 0.06 10*3/uL (ref 0.00–0.07)
Basophils Absolute: 0 10*3/uL (ref 0.0–0.1)
Basophils Relative: 1 %
Eosinophils Absolute: 0.1 10*3/uL (ref 0.0–0.5)
Eosinophils Relative: 2 %
HCT: 34.9 % — ABNORMAL LOW (ref 36.0–46.0)
Hemoglobin: 12.2 g/dL (ref 12.0–15.0)
Immature Granulocytes: 1 %
Lymphocytes Relative: 22 %
Lymphs Abs: 1.1 10*3/uL (ref 0.7–4.0)
MCH: 32.6 pg (ref 26.0–34.0)
MCHC: 35 g/dL (ref 30.0–36.0)
MCV: 93.3 fL (ref 80.0–100.0)
Monocytes Absolute: 0.2 10*3/uL (ref 0.1–1.0)
Monocytes Relative: 4 %
Neutro Abs: 3.5 10*3/uL (ref 1.7–7.7)
Neutrophils Relative %: 70 %
Platelet Count: 144 10*3/uL — ABNORMAL LOW (ref 150–400)
RBC: 3.74 MIL/uL — ABNORMAL LOW (ref 3.87–5.11)
RDW: 12.1 % (ref 11.5–15.5)
WBC Count: 4.9 10*3/uL (ref 4.0–10.5)
nRBC: 0 % (ref 0.0–0.2)

## 2021-06-07 LAB — CMP (CANCER CENTER ONLY)
ALT: 21 U/L (ref 0–44)
AST: 16 U/L (ref 15–41)
Albumin: 3.6 g/dL (ref 3.5–5.0)
Alkaline Phosphatase: 47 U/L (ref 38–126)
Anion gap: 4 — ABNORMAL LOW (ref 5–15)
BUN: 10 mg/dL (ref 6–20)
CO2: 30 mmol/L (ref 22–32)
Calcium: 9.2 mg/dL (ref 8.9–10.3)
Chloride: 101 mmol/L (ref 98–111)
Creatinine: 0.6 mg/dL (ref 0.44–1.00)
GFR, Estimated: >60 mL/min (ref >60)
Glucose, Bld: 121 mg/dL — ABNORMAL HIGH (ref 70–99)
Potassium: 3.6 mmol/L (ref 3.5–5.1)
Sodium: 135 mmol/L (ref 135–145)
Total Bilirubin: 0.4 mg/dL (ref 0.3–1.2)
Total Protein: 6.5 g/dL (ref 6.5–8.1)

## 2021-06-07 LAB — TSH: TSH: 0.583 u[IU]/mL (ref 0.350–4.500)

## 2021-06-07 MED ORDER — HEPARIN SOD (PORK) LOCK FLUSH 100 UNIT/ML IV SOLN
500.0000 [IU] | Freq: Once | INTRAVENOUS | Status: AC
  Administered 2021-06-07: 500 [IU]

## 2021-06-07 MED ORDER — SODIUM CHLORIDE 0.9% FLUSH
10.0000 mL | Freq: Once | INTRAVENOUS | Status: AC
  Administered 2021-06-07: 10 mL

## 2021-06-07 NOTE — Progress Notes (Signed)
?    [place] ?[phone]) [phone]   [phone] ? ?OFFICE PROGRESS NOTE ? ?[name], NP ?[address], [address] ?[place] [address] ? ?DIAGNOSIS: Extensive stage (T1c, N1, M1c) small cell lung cancer presented with left upper lobe lung nodule in addition to left hilar adenopathy, malignant left pleural effusion in addition to abdominal lymphadenopathy and metastatic bone lesions diagnosed in April 2023 ? ?PRIOR THERAPY: None ? ?CURRENT THERAPY: Systemic chemotherapy with carboplatin for AUC of 5 on day 1, etoposide 100 Mg/M2 on days 1, 2 and 3 with Cosela 240 Mg/M2 before the chemotherapy and Imfinzi 1500 Mg IV on day one of the chemotherapy every 3 weeks.  Status post 1 cycle. ? ?INTERVAL HISTORY: ?Patricia Pena 56 y.o. female returns to the clinic today for follow-up visit accompanied by her son Myrle Sheng.  The patient is feeling fine today with no concerning complaints except for the shortness of breath with exertion.  She also has some occasional pain on the rib area.  She denied having any hemoptysis.  She denied having any recent weight loss or night sweats.  She has no nausea, vomiting, diarrhea or constipation.  She tolerated the first week of her treatment with chemotherapy fairly well.  She had a PET scan performed yesterday and she was supposed to have MRI of the brain but this was not done as scheduled.  She is here today for evaluation and discussion of her PET scan results and management of any adverse effect of her treatment. ? ? ?MEDICAL HISTORY: ?Past Medical History:  ?Diagnosis Date  ? Arthritis   ? Incisional hernia without mention of obstruction or gangrene   ? Morbid obesity ([place])   ? Seizures ([place])   ? last one was in 1985  ? ? ?ALLERGIES:  is allergic to codeine. ? ?MEDICATIONS:  ?Current Outpatient Medications  ?Medication Sig Dispense Refill  ? CALCIUM-VITAMIN D PO Take 2,000 mg by mouth daily.  (Patient not taking: Reported on 05/20/2021)    ?  cholecalciferol (VITAMIN D) 1000 units tablet Take 1,000 Units by mouth daily. (Patient not taking: Reported on 05/20/2021)    ? Cyanocobalamin (VITAMIN B-12 PO) Take 1 tablet by mouth daily. (Patient not taking: Reported on 05/20/2021)    ? Desoximetasone 0.05 % OINT Apply 1 application. topically daily as needed (For skin rash).  0  ? furosemide (LASIX) 20 MG tablet Take 1 tablet (20 mg total) by mouth daily as needed (weight gain of 3lbs in 1 day or 5lbs in 1 week). (Patient not taking: Reported on 05/20/2021) 10 tablet 0  ? hydrOXYzine (ATARAX) 10 MG tablet Take 1 tablet (10 mg total) by mouth 3 (three) times daily as needed. (Patient not taking: Reported on 05/24/2021) 30 tablet 0  ? lidocaine-prilocaine (EMLA) cream Apply to the Port-A-Cath site 30 minutes before chemotherapy 30 g 0  ? lisinopril-hydrochlorothiazide (PRINZIDE,ZESTORETIC) 20-25 MG tablet TAKE 1 TABLET BY MOUTH EVERY DAY (Patient taking differently: Take 1 tablet by mouth daily.) 30 tablet 0  ? prochlorperazine (COMPAZINE) 10 MG tablet Take 1 tablet (10 mg total) by mouth every 6 (six) hours as needed for nausea or vomiting. 30 tablet 0  ? ?No current facility-administered medications for this visit.  ? ? ?SURGICAL HISTORY:  ?Past Surgical History:  ?Procedure Laterality Date  ? ABDOMINAL HYSTERECTOMY  2010  ? at [facility]  ? BREAST CYST ASPIRATION Right 2010  ? BREAST CYST EXCISION  1994  ? right breast  ?  BREAST EXCISIONAL BIOPSY Right   ? benign cyst  ? GALLBLADDER SURGERY  1990  ? IR IMAGING GUIDED PORT INSERTION  05/31/2021  ? LEFT HEART CATHETERIZATION WITH CORONARY ANGIOGRAM N/A 06/14/2011  ? Procedure: LEFT HEART CATHETERIZATION WITH CORONARY ANGIOGRAM;  Surgeon: Candee Furbish, MD;  Location: [facility] CATH LAB;  Service: Cardiovascular;  Laterality: N/A;  ? VAGINA SURGERY  2007  ? ? ?REVIEW OF SYSTEMS:  Constitutional: positive for fatigue ?Eyes: negative ?Ears, nose, mouth, throat, and face: negative ?Respiratory: positive for cough, dyspnea on exertion,  and pleurisy/chest pain ?Cardiovascular: negative ?Gastrointestinal: negative ?Genitourinary:negative ?Integument/breast: negative ?Hematologic/lymphatic: negative ?Musculoskeletal:negative ?Neurological: negative ?Behavioral/Psych: negative ?Endocrine: negative ?Allergic/Immunologic: negative  ? ?PHYSICAL EXAMINATION: General appearance: alert, cooperative, fatigued, and no distress ?Head: Normocephalic, without obvious abnormality, atraumatic ?Neck: no adenopathy, no JVD, supple, symmetrical, trachea midline, and thyroid not enlarged, symmetric, no tenderness/mass/nodules ?Lymph nodes: Cervical, supraclavicular, and axillary nodes normal. ?Resp: diminished breath sounds LLL and dullness to percussion LLL ?Back: symmetric, no curvature. ROM normal. No CVA tenderness. ?Cardio: regular rate and rhythm, S1, S2 normal, no murmur, click, rub or gallop ?GI: soft, non-tender; bowel sounds normal; no masses,  no organomegaly ?Extremities: extremities normal, atraumatic, no cyanosis or edema ?Neurologic: Alert and oriented X 3, normal strength and tone. Normal symmetric reflexes. Normal coordination and gait ? ?ECOG PERFORMANCE STATUS: 1 - Symptomatic but completely ambulatory ? ?There were no vitals taken for this visit. ? ?LABORATORY DATA: ?Lab Results  ?Component Value Date  ? WBC 10.6 (H) 05/30/2021  ? HGB 13.9 05/30/2021  ? HCT 40.4 05/30/2021  ? MCV 94.0 05/30/2021  ? PLT 243 05/30/2021  ? ? ?  Chemistry   ?   ?Component Value Date/Time  ? NA 136 05/30/2021 1108  ? NA 143 05/21/2017 1101  ? K 3.5 05/30/2021 1108  ? CL 101 05/30/2021 1108  ? CO2 27 05/30/2021 1108  ? BUN 12 05/30/2021 1108  ? BUN 13 05/21/2017 1101  ? CREATININE 0.77 05/30/2021 1108  ? CREATININE 0.78 08/02/2015 1944  ?    ?Component Value Date/Time  ? CALCIUM 9.4 05/30/2021 1108  ? ALKPHOS 49 05/30/2021 1108  ? AST 14 (L) 05/30/2021 1108  ? ALT 14 05/30/2021 1108  ? BILITOT 0.3 05/30/2021 1108  ?  ? ? ? ?RADIOGRAPHIC STUDIES: ?DG Chest 1  View ? ?Result Date: 05/30/2021 ?CLINICAL DATA:  Post left thoracentesis EXAM: CHEST  1 VIEW COMPARISON:  Chest radiograph from one day prior. FINDINGS: Insert: Stable cardiomediastinal silhouette with normal heart size. No pneumothorax. Moderate left pleural effusion, decreased. No right pleural effusion. No overt pulmonary edema. Left lung base dense consolidation, improved. IMPRESSION: 1. No pneumothorax. Moderate left pleural effusion, decreased. 2. Improved left lung base consolidation. Electronically Signed   By: Ilona Sorrel M.D.   On: 05/30/2021 13:20  ? ?DG Chest 1 View ? ?Result Date: 05/15/2021 ?CLINICAL DATA:  Status post thoracentesis. EXAM: CHEST  1 VIEW COMPARISON:  Earlier same base FINDINGS: 1057 hours. No evidence for pneumothorax. Left base collapse/consolidation with moderate left pleural effusion again noted. Slight decrease in right pleural fluid volume. Right lung clear. The cardio pericardial silhouette is enlarged. Telemetry leads overlie the chest. IMPRESSION: No evidence for pneumothorax after left thoracentesis. Electronically Signed   By: Misty Stanley M.D.   On: 05/15/2021 10:45  ? ?DG Chest 1 View ? ?Result Date: 05/14/2021 ?CLINICAL DATA:  Post thoracentesis on the left. EXAM: CHEST  1 VIEW COMPARISON:  Radiographs 05/13/2021 and 10/13/2008.  CT 05/13/2021. FINDINGS:  1228 hours. The left pleural effusion has mildly decreased in volume. There is no evidence of pneumothorax. The heart size and mediastinal contours are stable. There are stable left basilar and peripheral upper lobe airspace opacities. The right lung is clear. The bones appear unremarkable. Telemetry leads overlie the chest. IMPRESSION: Decreased size of left pleural effusion following thoracentesis. No evidence of pneumothorax. Electronically Signed   By: Richardean Sale M.D.   On: 05/14/2021 12:57  ? ?DG Chest 2 View ? ?Result Date: 05/29/2021 ?CLINICAL DATA:  Shortness of breath. EXAM: CHEST - 2 VIEW COMPARISON:  May 15, 2021 FINDINGS: The cardiac silhouette is partially obscured by large left pleural effusion, increased in size from the most recent comparison study. Streaky interstitial opacities in both lungs. Compressive atelectasis i

## 2021-06-07 NOTE — Progress Notes (Signed)
[place] CSW Progress Note ? ?Clinical Social Worker met with patient to discuss Financial controller.  Pt scheduled for clinic in supportive services on 5/8 at 12:00 to have Advanced Directives signed. ? ? ?Henriette Combs , LCSW ?

## 2021-06-07 NOTE — Progress Notes (Signed)
Oncology Nurse Navigator Documentation ? ? ?  06/07/2021  ? 10:00 AM 05/26/2021  ? 12:00 PM 05/24/2021  ?  3:00 PM 05/20/2021  ?  2:00 PM  ?Oncology Nurse Navigator Flowsheets  ?Abnormal Finding Date    05/13/2021  ?Confirmed Diagnosis Date    05/15/2021  ?Diagnosis Status    Confirmed Diagnosis Complete  ?Navigator Follow Up Date: 06/28/2021 05/27/2021 05/26/2021 05/24/2021  ?Navigator Follow Up Reason: Follow-up Appointment Appointment Review Appointment Review New Patient Appointment  ?Navigator Location [facility]  ?Referral Date to RadOnc/MedOnc    05/20/2021  ?Navigator Encounter Type Clinic/MDC Telephone Clinic/MDC;Initial MedOnc Other:;Telephone  ?Telephone  Outgoing Call  [place] Call  ?Treatment Initiated Date 05/30/2021     ?Patient Visit Type MedOnc;Follow-up  MedOnc;Initial   ?Treatment Phase Treatment Pre-Tx/Tx Discussion Pre-Tx/Tx Discussion Pre-Tx/Tx Discussion  ?Barriers/Navigation Needs Coordination of Care;Education Coordination of Care Education;Coordination of Care Coordination of Care;Education  ?Education Other  Actor Options;Other;Newly Diagnosed Cancer Education Other  ?Interventions Coordination of Care;Education;Psycho-Social Support/Patricia Pena is here for her follow up. She cancelled her mri brain so I was able to call radiology and get it re-scheduled. I updated patient and gave her a a calender.  Coordination of Care Coordination of Care;Psycho-Social Support;Education;Disability/FMLA Coordination of Care  ?Acuity Level 3-Moderate Needs (3-4 Barriers Identified) Level 3-Moderate Needs (3-4 Barriers Identified) Level 3-Moderate Needs (3-4 Barriers Identified) Level 2-Minimal Needs (1-2 Barriers Identified)  ?Coordination of Care Appts Appts Other Appts  ?Education Method Verbal;Other Verbal Verbal;Other   ?Time Spent with Patient 45 45 [phone] 30  ?  ?

## 2021-06-09 ENCOUNTER — Encounter: Payer: Self-pay | Admitting: Internal Medicine

## 2021-06-13 ENCOUNTER — Other Ambulatory Visit: Payer: Self-pay

## 2021-06-13 ENCOUNTER — Inpatient Hospital Stay: Payer: Medicaid Other

## 2021-06-13 ENCOUNTER — Other Ambulatory Visit: Payer: Self-pay | Admitting: Medical Oncology

## 2021-06-13 ENCOUNTER — Other Ambulatory Visit: Payer: Self-pay | Admitting: Internal Medicine

## 2021-06-13 ENCOUNTER — Inpatient Hospital Stay: Payer: Medicaid Other | Admitting: Licensed Clinical Social Worker

## 2021-06-13 DIAGNOSIS — C3492 Malignant neoplasm of unspecified part of left bronchus or lung: Secondary | ICD-10-CM

## 2021-06-13 DIAGNOSIS — Z95828 Presence of other vascular implants and grafts: Secondary | ICD-10-CM

## 2021-06-13 DIAGNOSIS — Z5111 Encounter for antineoplastic chemotherapy: Secondary | ICD-10-CM | POA: Diagnosis not present

## 2021-06-13 LAB — CMP (CANCER CENTER ONLY)
ALT: 18 U/L (ref 0–44)
AST: 14 U/L — ABNORMAL LOW (ref 15–41)
Albumin: 3.8 g/dL (ref 3.5–5.0)
Alkaline Phosphatase: 52 U/L (ref 38–126)
Anion gap: 5 (ref 5–15)
BUN: 13 mg/dL (ref 6–20)
CO2: 31 mmol/L (ref 22–32)
Calcium: 9.3 mg/dL (ref 8.9–10.3)
Chloride: 102 mmol/L (ref 98–111)
Creatinine: 0.66 mg/dL (ref 0.44–1.00)
GFR, Estimated: >60 mL/min (ref >60)
Glucose, Bld: 105 mg/dL — ABNORMAL HIGH (ref 70–99)
Potassium: 3.5 mmol/L (ref 3.5–5.1)
Sodium: 138 mmol/L (ref 135–145)
Total Bilirubin: 0.3 mg/dL (ref 0.3–1.2)
Total Protein: 7.1 g/dL (ref 6.5–8.1)

## 2021-06-13 LAB — CBC WITH DIFFERENTIAL (CANCER CENTER ONLY)
Abs Immature Granulocytes: 0.01 10*3/uL (ref 0.00–0.07)
Basophils Absolute: 0 10*3/uL (ref 0.0–0.1)
Basophils Relative: 1 %
Eosinophils Absolute: 0.1 10*3/uL (ref 0.0–0.5)
Eosinophils Relative: 2 %
HCT: 34.5 % — ABNORMAL LOW (ref 36.0–46.0)
Hemoglobin: 11.8 g/dL — ABNORMAL LOW (ref 12.0–15.0)
Immature Granulocytes: 0 %
Lymphocytes Relative: 34 %
Lymphs Abs: 1.2 10*3/uL (ref 0.7–4.0)
MCH: 32.6 pg (ref 26.0–34.0)
MCHC: 34.2 g/dL (ref 30.0–36.0)
MCV: 95.3 fL (ref 80.0–100.0)
Monocytes Absolute: 0.5 10*3/uL (ref 0.1–1.0)
Monocytes Relative: 16 %
Neutro Abs: 1.6 10*3/uL — ABNORMAL LOW (ref 1.7–7.7)
Neutrophils Relative %: 47 %
Platelet Count: 138 10*3/uL — ABNORMAL LOW (ref 150–400)
RBC: 3.62 MIL/uL — ABNORMAL LOW (ref 3.87–5.11)
RDW: 13 % (ref 11.5–15.5)
WBC Count: 3.4 10*3/uL — ABNORMAL LOW (ref 4.0–10.5)
nRBC: 0 % (ref 0.0–0.2)

## 2021-06-13 MED ORDER — SODIUM CHLORIDE 0.9% FLUSH
10.0000 mL | Freq: Once | INTRAVENOUS | Status: AC
  Administered 2021-06-13: 10 mL

## 2021-06-13 MED ORDER — HEPARIN SOD (PORK) LOCK FLUSH 100 UNIT/ML IV SOLN
500.0000 [IU] | Freq: Once | INTRAVENOUS | Status: AC
  Administered 2021-06-13: 500 [IU]

## 2021-06-13 MED ORDER — LORAZEPAM 1 MG PO TABS
ORAL_TABLET | ORAL | 0 refills | Status: AC
Start: 2021-06-13 — End: ?

## 2021-06-13 NOTE — Progress Notes (Signed)
[place] Advance Directives ?Clinical Social Work ? ?Patient presented to [facility]  to review and complete healthcare advance directives.  Clinical Social Worker met with patient.  The patient designated [employer] as their primary healthcare agent and did not select a secondary agent.  Patient also completed healthcare living will.   ? ?Documents were notarized and copies made for patient/family. Clinical Social Worker will send documents to medical records to be scanned into patient's chart. ?Clinical Social Worker encouraged patient/family to contact with any additional questions or concerns. ? ? ?Patricia Thomassen E Ralene Gasparyan, LCSW ?Clinical Social Worker ?[facility]      ? ?

## 2021-06-14 ENCOUNTER — Ambulatory Visit (HOSPITAL_COMMUNITY)
Admission: RE | Admit: 2021-06-14 | Discharge: 2021-06-14 | Disposition: A | Discharge status: Home / Self Care | Payer: Medicaid Other | Source: Ambulatory Visit | Attending: Physician Assistant | Admitting: Physician Assistant

## 2021-06-14 DIAGNOSIS — C3490 Malignant neoplasm of unspecified part of unspecified bronchus or lung: Secondary | ICD-10-CM | POA: Diagnosis not present

## 2021-06-14 MED ORDER — GADOBUTROL 1 MMOL/ML IV SOLN
10.0000 mL | Freq: Once | INTRAVENOUS | Status: AC | PRN
  Administered 2021-06-14: 10 mL via INTRAVENOUS

## 2021-06-15 ENCOUNTER — Telehealth: Payer: Self-pay

## 2021-06-15 NOTE — Telephone Encounter (Signed)
Pt states she wants a phone call if she has a "really bad" MRI Brain results. Pt was advised she has an appt on 06/20/21 to discuss/review results in details. Pt expressed understanding of this information. ?

## 2021-06-16 NOTE — Progress Notes (Signed)
[place] ?OFFICE PROGRESS NOTE ? ?[name], NP ?[address], [address] ?[place] [address] ? ?DIAGNOSIS:  Extensive stage (T1c, N1, M1c) small cell lung cancer presented with left upper lobe lung nodule in addition to left hilar adenopathy, malignant left pleural effusion in addition to abdominal lymphadenopathy and metastatic bone lesions diagnosed in April 2023 ? ?PRIOR THERAPY: None ? ?CURRENT THERAPY: Systemic chemotherapy with carboplatin for AUC of 5 on day 1, etoposide 100 Mg/M2 on days 1, 2 and 3 with Cosela 240 Mg/M2 before the chemotherapy and Imfinzi 1500 Mg IV on day one of the chemotherapy every 3 weeks.  Status post 1 cycle.  She is going to transfer her care to [facility] starting from cycle #3 for consideration of clinical trial with the addition of Taratamab. ? ?INTERVAL HISTORY: ?Patricia Pena 56 y.o. female returns to the clinic today for a follow-up visit.  The patient is feeling fairly well today without any concerning complaints. She noted improvement in her back pain after undergoing chemotherapy which she is pleased about. She has some questions about whether she can have chia seeds daily.    ? ?Since last being seen, patient went to Dr. Lennart Pall for second opinion at [facility].  The patient would like to enroll in a clinical trial. She would like to treat her cancer aggressively. Dr. Mariel Kansky is going to evaluate her eligibility for the addition of Taratamab to her treatment starting from cycle #3.  She is going to receive cycle #2 locally before transitioning her care. ?She is going to reach out to them to make her next follow up. She is expected to have cycle #3 on 07/11/21.  ? ?Since last being seen, the patient completed the staging work-up with a brain MRI.  ? ?Otherwise, she denies any new concerning complaints today. She needs a refill of her nausea medications.  She denies any fever, chills, night sweats, or unexplained weight loss. She has a history of  recurrent malignant effusions, having last been drained on 4/24. She reports dyspnea with laying down. She reports her breathing is not as bad as prior to her thoracenteses; however, her breathing is not as good as post procedure. She would like to get this re-evaluated and possibly drained on 06/23/21 if needed. Denies any unusual cough or hemoptysis. When notes that if she cough's, it is usually dry unless she uses her spirometer which helps produce clear phlegm.  Denies any chest pain. She has some nausea.  Denies any vomiting, diarrhea, or constipation.  Denies any headache or visual changes besides she states she is overdue for her eye exam and notes her prescription for glasses has been dated for a few years. She notes a few years ago she started having cataracts.  Denies any rashes or skin changes.  She is here today for evaluation, repeat blood work, and to review her brain MRI before starting cycle #2. ? ? ?MEDICAL HISTORY: ?Past Medical History:  ?Diagnosis Date  ? Arthritis   ? Incisional hernia without mention of obstruction or gangrene   ? Morbid obesity ([place])   ? Seizures ([place])   ? last one was in 1985  ? ? ?ALLERGIES:  is allergic to codeine. ? ?MEDICATIONS:  ?Current Outpatient Medications  ?Medication Sig Dispense Refill  ? CALCIUM-VITAMIN D PO Take 2,000 mg by mouth daily.    ? cholecalciferol (VITAMIN D) 1000 units tablet Take 1,000 Units by mouth daily.    ?  Desoximetasone 0.05 % OINT Apply 1 application. topically daily as needed (For skin rash).  0  ? furosemide (LASIX) 20 MG tablet Take 1 tablet (20 mg total) by mouth daily as needed (weight gain of 3lbs in 1 day or 5lbs in 1 week). 10 tablet 0  ? hydrOXYzine (ATARAX) 10 MG tablet Take 1 tablet (10 mg total) by mouth 3 (three) times daily as needed. 30 tablet 0  ? lisinopril-hydrochlorothiazide (PRINZIDE,ZESTORETIC) 20-25 MG tablet TAKE 1 TABLET BY MOUTH EVERY DAY (Patient taking differently: Take 1 tablet by mouth daily.) 30 tablet 0  ?  Cyanocobalamin (VITAMIN B-12 PO) Take 1 tablet by mouth daily. (Patient not taking: Reported on 06/20/2021)    ? lidocaine-prilocaine (EMLA) cream Apply to the Port-A-Cath site 30 minutes before chemotherapy (Patient not taking: Reported on 06/20/2021) 30 g 0  ? LORazepam (ATIVAN) 1 MG tablet 1 tablet 30 minutes before MRI of the brain. (Patient not taking: Reported on 06/20/2021) 1 tablet 0  ? prochlorperazine (COMPAZINE) 10 MG tablet Take 1 tablet (10 mg total) by mouth every 6 (six) hours as needed for nausea or vomiting. 30 tablet 2  ? ?No current facility-administered medications for this visit.  ? ? ?SURGICAL HISTORY:  ?Past Surgical History:  ?Procedure Laterality Date  ? ABDOMINAL HYSTERECTOMY  2010  ? at [facility]  ? BREAST CYST ASPIRATION Right 2010  ? BREAST CYST EXCISION  1994  ? right breast  ? BREAST EXCISIONAL BIOPSY Right   ? benign cyst  ? [place]  ? IR IMAGING GUIDED PORT INSERTION  05/31/2021  ? LEFT HEART CATHETERIZATION WITH CORONARY ANGIOGRAM N/A 06/14/2011  ? Procedure: LEFT HEART CATHETERIZATION WITH CORONARY ANGIOGRAM;  Surgeon: Candee Furbish, MD;  Location: [facility] CATH LAB;  Service: Cardiovascular;  Laterality: N/A;  ? VAGINA SURGERY  2007  ? ? ?REVIEW OF SYSTEMS:   ?Review of Systems  ?Constitutional: Negative for appetite change, chills, fatigue, fever and unexpected weight change.  ?HENT:   Negative for mouth sores, nosebleeds, sore throat and trouble swallowing.   ?Eyes: Negative for eye problems and icterus.  ?Respiratory: Positive for dyspnea. Negative for cough, hemoptysis, and wheezing.   ?Cardiovascular: Negative for chest pain and leg swelling.  ?Gastrointestinal: Negative for abdominal pain, constipation, diarrhea, nausea and vomiting.  ?Genitourinary: Negative for bladder incontinence, difficulty urinating, dysuria, frequency and hematuria.   ?Musculoskeletal: Negative for back pain (improved), gait problem, neck pain and neck stiffness.  ?Skin: Negative for itching and  rash.  ?Neurological: Negative for dizziness, extremity weakness, gait problem, headaches, light-headedness and seizures.  ?Hematological: Negative for adenopathy. Does not bruise/bleed easily.  ?Psychiatric/Behavioral: Negative for confusion, depression and sleep disturbance. The patient is not nervous/anxious.   ? ? ?PHYSICAL EXAMINATION:  ?Blood pressure 120/72, pulse 93, temperature (!) 97.5 ?F (36.4 ?C), temperature source Tympanic, resp. rate 18, height 5\' 3"  (1.6 m), weight 281 lb (127.5 kg), SpO2 96 %. ? ?ECOG PERFORMANCE STATUS: 1 ? ?Physical Exam  ?Constitutional: Oriented to person, place, and time and well-developed, well-nourished, and in no distress.  ?HENT:  ?Head: Normocephalic and atraumatic.  ?Mouth/Throat: Oropharynx is clear and moist. No oropharyngeal exudate.  ?Eyes: Conjunctivae are normal. Right eye exhibits no discharge. Left eye exhibits no discharge. No scleral icterus.  ?Neck: Normal range of motion. Neck supple.  ?Cardiovascular: Normal rate, regular rhythm, normal heart sounds and intact distal pulses.   ?Pulmonary/Chest: Effort normal. Decreased breath sounds in lower left lung field. No respiratory distress. No wheezes. No rales.  ?  Abdominal: Soft. Bowel sounds are normal. Exhibits no distension and no mass. There is no tenderness.  ?Musculoskeletal: Normal range of motion. Exhibits no edema.  ?Lymphadenopathy:  ?  No cervical adenopathy.  ?Neurological: Alert and oriented to person, place, and time. Exhibits normal muscle tone. Gait normal. Coordination normal.  ?Skin: Skin is warm and dry. No rash noted. Not diaphoretic. No erythema. No pallor.  ?Psychiatric: Mood, memory and judgment normal.  ?Vitals reviewed. ? ?LABORATORY DATA: ?Lab Results  ?Component Value Date  ? WBC 4.2 06/20/2021  ? HGB 12.8 06/20/2021  ? HCT 36.5 06/20/2021  ? MCV 95.1 06/20/2021  ? PLT 295 06/20/2021  ? ? ?  Chemistry   ?   ?Component Value Date/Time  ? NA 137 06/20/2021 0854  ? NA 143 05/21/2017 1101  ? K  3.4 (L) 06/20/2021 0854  ? CL 101 06/20/2021 0854  ? CO2 30 06/20/2021 0854  ? BUN 8 06/20/2021 0854  ? BUN 13 05/21/2017 1101  ? CREATININE 0.76 06/20/2021 0854  ? CREATININE 0.78 08/02/2015 1944  ?    ?Compone

## 2021-06-16 NOTE — Progress Notes (Signed)
..  Patient is receiving Assistance Medication - Supplied Externally. ?Medication: Cosela ?Manufacture: [employer] ?Approval Dates: Approved from 06/15/2021 until 06/16/2022. ?ID: [id number] ?Reason: Self Pay  ?

## 2021-06-17 MED FILL — Dexamethasone Sodium Phosphate Inj 100 MG/10ML: INTRAMUSCULAR | Qty: 1 | Status: AC

## 2021-06-17 MED FILL — Fosaprepitant Dimeglumine For IV Infusion 150 MG (Base Eq): INTRAVENOUS | Qty: 5 | Status: AC

## 2021-06-17 NOTE — Progress Notes (Signed)
..  Patient is receiving Assistance Medication - Supplied Externally. ?Medication: Imfinzi ?Manufacture: [place] [employer] [place] ?Approval Dates: Approved from 5/12/203 until 3 shipments (temporary until medicaid approved/denied). ?Reason: Self Pay  ?

## 2021-06-20 ENCOUNTER — Other Ambulatory Visit: Payer: Self-pay

## 2021-06-20 ENCOUNTER — Encounter: Payer: Self-pay | Admitting: Nutrition

## 2021-06-20 ENCOUNTER — Inpatient Hospital Stay: Payer: Medicaid Other

## 2021-06-20 ENCOUNTER — Inpatient Hospital Stay (HOSPITAL_BASED_OUTPATIENT_CLINIC_OR_DEPARTMENT_OTHER): Payer: Medicaid Other | Admitting: Physician Assistant

## 2021-06-20 ENCOUNTER — Encounter: Payer: Self-pay | Admitting: Physician Assistant

## 2021-06-20 ENCOUNTER — Ambulatory Visit (HOSPITAL_COMMUNITY)
Admission: RE | Admit: 2021-06-20 | Discharge: 2021-06-20 | Disposition: A | Discharge status: Home / Self Care | Payer: Medicaid Other | Source: Ambulatory Visit | Attending: Physician Assistant | Admitting: Physician Assistant

## 2021-06-20 VITALS — BP 120/72 | HR 93 | Temp 97.5°F | Resp 18 | Ht 63.0 in | Wt 281.0 lb

## 2021-06-20 DIAGNOSIS — C3492 Malignant neoplasm of unspecified part of left bronchus or lung: Secondary | ICD-10-CM | POA: Diagnosis not present

## 2021-06-20 DIAGNOSIS — Z95828 Presence of other vascular implants and grafts: Secondary | ICD-10-CM

## 2021-06-20 DIAGNOSIS — Z5112 Encounter for antineoplastic immunotherapy: Secondary | ICD-10-CM

## 2021-06-20 DIAGNOSIS — Z5111 Encounter for antineoplastic chemotherapy: Secondary | ICD-10-CM | POA: Diagnosis not present

## 2021-06-20 LAB — CMP (CANCER CENTER ONLY)
ALT: 19 U/L (ref 0–44)
AST: 16 U/L (ref 15–41)
Albumin: 3.9 g/dL (ref 3.5–5.0)
Alkaline Phosphatase: 51 U/L (ref 38–126)
Anion gap: 6 (ref 5–15)
BUN: 8 mg/dL (ref 6–20)
CO2: 30 mmol/L (ref 22–32)
Calcium: 9.7 mg/dL (ref 8.9–10.3)
Chloride: 101 mmol/L (ref 98–111)
Creatinine: 0.76 mg/dL (ref 0.44–1.00)
GFR, Estimated: >60 mL/min (ref >60)
Glucose, Bld: 182 mg/dL — ABNORMAL HIGH (ref 70–99)
Potassium: 3.4 mmol/L — ABNORMAL LOW (ref 3.5–5.1)
Sodium: 137 mmol/L (ref 135–145)
Total Bilirubin: 0.4 mg/dL (ref 0.3–1.2)
Total Protein: 7.1 g/dL (ref 6.5–8.1)

## 2021-06-20 LAB — CBC WITH DIFFERENTIAL (CANCER CENTER ONLY)
Abs Immature Granulocytes: 0.02 10*3/uL (ref 0.00–0.07)
Basophils Absolute: 0 10*3/uL (ref 0.0–0.1)
Basophils Relative: 1 %
Eosinophils Absolute: 0 10*3/uL (ref 0.0–0.5)
Eosinophils Relative: 1 %
HCT: 36.5 % (ref 36.0–46.0)
Hemoglobin: 12.8 g/dL (ref 12.0–15.0)
Immature Granulocytes: 1 %
Lymphocytes Relative: 30 %
Lymphs Abs: 1.2 10*3/uL (ref 0.7–4.0)
MCH: 33.3 pg (ref 26.0–34.0)
MCHC: 35.1 g/dL (ref 30.0–36.0)
MCV: 95.1 fL (ref 80.0–100.0)
Monocytes Absolute: 0.6 10*3/uL (ref 0.1–1.0)
Monocytes Relative: 15 %
Neutro Abs: 2.2 10*3/uL (ref 1.7–7.7)
Neutrophils Relative %: 52 %
Platelet Count: 295 10*3/uL (ref 150–400)
RBC: 3.84 MIL/uL — ABNORMAL LOW (ref 3.87–5.11)
RDW: 13.8 % (ref 11.5–15.5)
WBC Count: 4.2 10*3/uL (ref 4.0–10.5)
nRBC: 0 % (ref 0.0–0.2)

## 2021-06-20 MED ORDER — SODIUM CHLORIDE 0.9 % IV SOLN: Freq: Once | INTRAVENOUS | Status: AC

## 2021-06-20 MED ORDER — SODIUM CHLORIDE 0.9% FLUSH
10.0000 mL | INTRAVENOUS | Status: DC | PRN
  Administered 2021-06-20: 10 mL

## 2021-06-20 MED ORDER — SODIUM CHLORIDE 0.9 % IV SOLN
1500.0000 mg | Freq: Once | INTRAVENOUS | Status: AC
  Administered 2021-06-20: 1500 mg via INTRAVENOUS
  Filled 2021-06-20: qty 30

## 2021-06-20 MED ORDER — SODIUM CHLORIDE 0.9 % IV SOLN
150.0000 mg | Freq: Once | INTRAVENOUS | Status: AC
  Administered 2021-06-20: 150 mg via INTRAVENOUS
  Filled 2021-06-20: qty 150

## 2021-06-20 MED ORDER — SODIUM CHLORIDE 0.9% FLUSH
10.0000 mL | Freq: Once | INTRAVENOUS | Status: AC
  Administered 2021-06-20: 10 mL

## 2021-06-20 MED ORDER — SODIUM CHLORIDE 0.9 % IV SOLN
100.0000 mg/m2 | Freq: Once | INTRAVENOUS | Status: AC
  Administered 2021-06-20: 240 mg via INTRAVENOUS
  Filled 2021-06-20: qty 12

## 2021-06-20 MED ORDER — HEPARIN SOD (PORK) LOCK FLUSH 100 UNIT/ML IV SOLN
500.0000 [IU] | Freq: Once | INTRAVENOUS | Status: AC | PRN
  Administered 2021-06-20: 500 [IU]

## 2021-06-20 MED ORDER — PROCHLORPERAZINE MALEATE 10 MG PO TABS: 10.0000 mg | ORAL_TABLET | Freq: Four times a day (QID) | ORAL | 2 refills | Status: DC | PRN

## 2021-06-20 MED ORDER — SODIUM CHLORIDE 0.9 % IV SOLN
750.0000 mg | Freq: Once | INTRAVENOUS | Status: AC
  Administered 2021-06-20: 750 mg via INTRAVENOUS
  Filled 2021-06-20: qty 75

## 2021-06-20 MED ORDER — TRILACICLIB DIHYDROCHLORIDE INJECTION 300 MG
240.0000 mg/m2 | Freq: Once | INTRAVENOUS | Status: AC
  Administered 2021-06-20: 600 mg via INTRAVENOUS
  Filled 2021-06-20: qty 40

## 2021-06-20 MED ORDER — SODIUM CHLORIDE 0.9 % IV SOLN
10.0000 mg | Freq: Once | INTRAVENOUS | Status: AC
  Administered 2021-06-20: 10 mg via INTRAVENOUS
  Filled 2021-06-20: qty 10

## 2021-06-20 MED ORDER — PALONOSETRON HCL INJECTION 0.25 MG/5ML
0.2500 mg | Freq: Once | INTRAVENOUS | Status: AC
  Administered 2021-06-20: 0.25 mg via INTRAVENOUS
  Filled 2021-06-20: qty 5

## 2021-06-20 MED FILL — Dexamethasone Sodium Phosphate Inj 100 MG/10ML: INTRAMUSCULAR | Qty: 1 | Status: AC

## 2021-06-20 NOTE — Progress Notes (Signed)
Nutrition Follow-up: ? ?Patient with small cell lung cancer.  Receiving chemotherapy and immunotherapy.  Patient planning on transferring care to [facility] starting with Cycle 3 for clinical trial.  ? ?Met with patient during infusion.  Reports that her appetite is normal.  Has been trying to follow a low sodium diet.  Does not like the lack of flavor from salt on foods. Has tried no salt alternative seasonings.   ? ? ?NUTRITION DIAGNOSIS: resolved ? ? ?INTERVENTION:  ?Patient has no other nutrition related questions or concerns at this time.   ?Encouraged patient to reach out if has further nutrition related questions or concerns.   ? ? ?NEXT VISIT:  ?No follow-up ?RD available if needed ? ?Patricia Pena, RD, LDN ?Registered Dietitian ?[phone] ? ? ?

## 2021-06-21 ENCOUNTER — Inpatient Hospital Stay: Payer: Medicaid Other

## 2021-06-21 ENCOUNTER — Telehealth: Payer: Self-pay

## 2021-06-21 VITALS — BP 106/63 | HR 95 | Temp 98.9°F | Resp 18

## 2021-06-21 DIAGNOSIS — C3492 Malignant neoplasm of unspecified part of left bronchus or lung: Secondary | ICD-10-CM

## 2021-06-21 DIAGNOSIS — Z5111 Encounter for antineoplastic chemotherapy: Secondary | ICD-10-CM | POA: Diagnosis not present

## 2021-06-21 MED ORDER — SODIUM CHLORIDE 0.9 % IV SOLN: Freq: Once | INTRAVENOUS | Status: AC

## 2021-06-21 MED ORDER — ACETAMINOPHEN 325 MG PO TABS
650.0000 mg | ORAL_TABLET | Freq: Once | ORAL | Status: AC
  Administered 2021-06-21: 650 mg via ORAL
  Filled 2021-06-21: qty 2

## 2021-06-21 MED ORDER — SODIUM CHLORIDE 0.9 % IV SOLN
10.0000 mg | Freq: Once | INTRAVENOUS | Status: AC
  Administered 2021-06-21: 10 mg via INTRAVENOUS
  Filled 2021-06-21: qty 10

## 2021-06-21 MED ORDER — SODIUM CHLORIDE 0.9 % IV SOLN
100.0000 mg/m2 | Freq: Once | INTRAVENOUS | Status: AC
  Administered 2021-06-21: 240 mg via INTRAVENOUS
  Filled 2021-06-21: qty 12

## 2021-06-21 MED ORDER — TRILACICLIB DIHYDROCHLORIDE INJECTION 300 MG
240.0000 mg/m2 | Freq: Once | INTRAVENOUS | Status: AC
  Administered 2021-06-21: 600 mg via INTRAVENOUS
  Filled 2021-06-21: qty 40

## 2021-06-21 MED ORDER — SODIUM CHLORIDE 0.9% FLUSH
10.0000 mL | INTRAVENOUS | Status: DC | PRN
  Administered 2021-06-21: 10 mL

## 2021-06-21 MED ORDER — HEPARIN SOD (PORK) LOCK FLUSH 100 UNIT/ML IV SOLN
500.0000 [IU] | Freq: Once | INTRAVENOUS | Status: AC | PRN
  Administered 2021-06-21: 500 [IU]

## 2021-06-21 MED FILL — Dexamethasone Sodium Phosphate Inj 100 MG/10ML: INTRAMUSCULAR | Qty: 1 | Status: AC

## 2021-06-21 NOTE — Patient Instructions (Signed)
[facility]  Discharge Instructions: ?Thank you for choosing [facility] to provide your oncology and hematology care.  ? ?If you have a lab appointment with the [facility], please go directly to the [facility] and check in at the registration area. ?  ?Wear comfortable clothing and clothing appropriate for easy access to any Portacath or PICC line.  ? ?We strive to give you quality time with your provider. You may need to reschedule your appointment if you arrive late (15 or more minutes).  Arriving late affects you and other patients whose appointments are after yours.  Also, if you miss three or more appointments without notifying the office, you may be dismissed from the clinic at the provider?s discretion.    ?  ?For prescription refill requests, have your pharmacy contact our office and allow 72 hours for refills to be completed.   ? ?Today you received the following chemotherapy and/or immunotherapy agents: Cosela/Etoposide    ?  ?To help prevent nausea and vomiting after your treatment, we encourage you to take your nausea medication as directed. ? ?BELOW ARE SYMPTOMS THAT SHOULD BE REPORTED IMMEDIATELY: ?*FEVER GREATER THAN 100.4 F (38 ?C) OR HIGHER ?*CHILLS OR SWEATING ?*NAUSEA AND VOMITING THAT IS NOT CONTROLLED WITH YOUR NAUSEA MEDICATION ?*UNUSUAL SHORTNESS OF BREATH ?*UNUSUAL BRUISING OR BLEEDING ?*URINARY PROBLEMS (pain or burning when urinating, or frequent urination) ?*BOWEL PROBLEMS (unusual diarrhea, constipation, pain near the anus) ?TENDERNESS IN MOUTH AND THROAT WITH OR WITHOUT PRESENCE OF ULCERS (sore throat, sores in mouth, or a toothache) ?UNUSUAL RASH, SWELLING OR PAIN  ?UNUSUAL VAGINAL DISCHARGE OR ITCHING  ? ?Items with * indicate a potential emergency and should be followed up as soon as possible or go to the Emergency Department if any problems should occur. ? ?Please show the CHEMOTHERAPY ALERT CARD or IMMUNOTHERAPY ALERT CARD at  check-in to the Emergency Department and triage nurse. ? ?Should you have questions after your visit or need to cancel or reschedule your appointment, please contact [facility]  Dept: [phone]  and follow the prompts.  Office hours are 8:00 a.m. to 4:30 p.m. Monday - Friday. Please note that voicemails left after 4:00 p.m. may not be returned until the following business day.  We are closed weekends and major holidays. You have access to a nurse at all times for urgent questions. Please call the main number to the clinic Dept: [phone] and follow the prompts. ? ? ?For any non-urgent questions, you may also contact your provider using MyChart. We now offer e-Visits for anyone 79 and older to request care online for non-urgent symptoms. For details visit [email or web address]. ?  ?Also download the MyChart app! Go to the app store, search "MyChart", open the app, select [facility], and log in with your MyChart username and password. ? ?Due to Covid, a mask is required upon entering the hospital/clinic. If you do not have a mask, one will be given to you upon arrival. For doctor visits, patients may have 1 support person aged 76 or older with them. For treatment visits, patients cannot have anyone with them due to current Covid guidelines and our immunocompromised population.  ? ?

## 2021-06-21 NOTE — Telephone Encounter (Signed)
I have left a detailed message for the pt along with the phone number to schedule the thoracentesis if she wishes to do so. ?

## 2021-06-21 NOTE — Telephone Encounter (Signed)
-----   Message from [place], PA-C sent at 06/20/2021  4:00 PM EDT ----- ?Can you call her and let her know the pleural effusion is moderate. If she feels like she needs it, I think it would be reasonable to drain it before she establishes with [facility]. I placed the order. She can schedule at her convenience based on symptoms. They will likely call her to schedule this at IR if they have not done so already.  ?----- Message ----- ?From: Interface, Rad Results In ?Sent: 06/20/2021   3:17 PM EDT ?To: Patricia L Heilingoetter, PA-C ? ? ?

## 2021-06-22 ENCOUNTER — Other Ambulatory Visit: Payer: Self-pay

## 2021-06-22 ENCOUNTER — Ambulatory Visit (HOSPITAL_COMMUNITY)
Admission: RE | Admit: 2021-06-22 | Discharge: 2021-06-22 | Disposition: A | Discharge status: Home / Self Care | Payer: Medicaid Other | Source: Ambulatory Visit | Attending: Physician Assistant | Admitting: Physician Assistant

## 2021-06-22 ENCOUNTER — Ambulatory Visit (HOSPITAL_COMMUNITY)
Admission: RE | Admit: 2021-06-22 | Discharge: 2021-06-22 | Disposition: A | Discharge status: Home / Self Care | Payer: Medicaid Other | Source: Ambulatory Visit | Attending: Radiology | Admitting: Radiology

## 2021-06-22 ENCOUNTER — Inpatient Hospital Stay: Payer: Medicaid Other

## 2021-06-22 VITALS — HR 71 | Temp 98.0°F | Resp 18

## 2021-06-22 VITALS — BP 125/90

## 2021-06-22 DIAGNOSIS — J90 Pleural effusion, not elsewhere classified: Secondary | ICD-10-CM | POA: Insufficient documentation

## 2021-06-22 DIAGNOSIS — C3492 Malignant neoplasm of unspecified part of left bronchus or lung: Secondary | ICD-10-CM | POA: Insufficient documentation

## 2021-06-22 DIAGNOSIS — R0600 Dyspnea, unspecified: Secondary | ICD-10-CM | POA: Diagnosis not present

## 2021-06-22 DIAGNOSIS — Z5111 Encounter for antineoplastic chemotherapy: Secondary | ICD-10-CM | POA: Diagnosis not present

## 2021-06-22 MED ORDER — SODIUM CHLORIDE 0.9% FLUSH
10.0000 mL | INTRAVENOUS | Status: DC | PRN
  Administered 2021-06-22: 10 mL

## 2021-06-22 MED ORDER — SODIUM CHLORIDE 0.9 % IV SOLN
10.0000 mg | Freq: Once | INTRAVENOUS | Status: AC
  Administered 2021-06-22: 10 mg via INTRAVENOUS
  Filled 2021-06-22: qty 10

## 2021-06-22 MED ORDER — LIDOCAINE HCL 1 % IJ SOLN
INTRAMUSCULAR | Status: AC
  Filled 2021-06-22: qty 20

## 2021-06-22 MED ORDER — HEPARIN SOD (PORK) LOCK FLUSH 100 UNIT/ML IV SOLN
500.0000 [IU] | Freq: Once | INTRAVENOUS | Status: AC | PRN
  Administered 2021-06-22: 500 [IU]

## 2021-06-22 MED ORDER — TRILACICLIB DIHYDROCHLORIDE INJECTION 300 MG
240.0000 mg/m2 | Freq: Once | INTRAVENOUS | Status: AC
  Administered 2021-06-22: 600 mg via INTRAVENOUS
  Filled 2021-06-22: qty 40

## 2021-06-22 MED ORDER — SODIUM CHLORIDE 0.9 % IV SOLN: Freq: Once | INTRAVENOUS | Status: AC

## 2021-06-22 MED ORDER — SODIUM CHLORIDE 0.9 % IV SOLN
100.0000 mg/m2 | Freq: Once | INTRAVENOUS | Status: AC
  Administered 2021-06-22: 240 mg via INTRAVENOUS
  Filled 2021-06-22: qty 12

## 2021-06-22 NOTE — Procedures (Signed)
Ultrasound-guided therapeutic left thoracentesis performed yielding 1.2 liters of blood-tinged fluid. No immediate complications. Follow-up chest x-ray pending. EBL< 1 cc.     ? ?

## 2021-06-22 NOTE — Patient Instructions (Signed)
[facility]  Discharge Instructions: Thank you for choosing [facility] to provide your oncology and hematology care.   If you have a lab appointment with the Cancer Center, please go directly to the Cancer Center and check in at the registration area.   Wear comfortable clothing and clothing appropriate for easy access to any Portacath or PICC line.   We strive to give you quality time with your provider. You may need to reschedule your appointment if you arrive late (15 or more minutes).  Arriving late affects you and other patients whose appointments are after yours.  Also, if you miss three or more appointments without notifying the office, you may be dismissed from the clinic at the provider's discretion.      For prescription refill requests, have your pharmacy contact our office and allow 72 hours for refills to be completed.    Today you received the following chemotherapy and/or immunotherapy agents: etoposide.     To help prevent nausea and vomiting after your treatment, we encourage you to take your nausea medication as directed.  BELOW ARE SYMPTOMS THAT SHOULD BE REPORTED IMMEDIATELY: . *FEVER GREATER THAN 100.4 F (38 C) OR HIGHER . *CHILLS OR SWEATING . *NAUSEA AND VOMITING THAT IS NOT CONTROLLED WITH YOUR NAUSEA MEDICATION . *UNUSUAL SHORTNESS OF BREATH . *UNUSUAL BRUISING OR BLEEDING . *URINARY PROBLEMS (pain or burning when urinating, or frequent urination) . *BOWEL PROBLEMS (unusual diarrhea, constipation, pain near the anus) . TENDERNESS IN MOUTH AND THROAT WITH OR WITHOUT PRESENCE OF ULCERS (sore throat, sores in mouth, or a toothache) . UNUSUAL RASH, SWELLING OR PAIN  . UNUSUAL VAGINAL DISCHARGE OR ITCHING   Items with * indicate a potential emergency and should be followed up as soon as possible or go to the Emergency Department if any problems should occur.  Please show the CHEMOTHERAPY ALERT CARD or IMMUNOTHERAPY ALERT  CARD at check-in to the Emergency Department and triage nurse.  Should you have questions after your visit or need to cancel or reschedule your appointment, please contact [facility]  Dept: [phone]  and follow the prompts.  Office hours are 8:00 a.m. to 4:30 p.m. Monday - Friday. Please note that voicemails left after 4:00 p.m. may not be returned until the following business day.  We are closed weekends and major holidays. You have access to a nurse at all times for urgent questions. Please call the main number to the clinic Dept: [phone] and follow the prompts.   For any non-urgent questions, you may also contact your provider using MyChart. We now offer e-Visits for anyone 18 and older to request care online for non-urgent symptoms. For details visit [email or web address].   Also download the MyChart app! Go to the app store, search "MyChart", open the app, select [facility], and log in with your MyChart username and password.  Due to Covid, a mask is required upon entering the hospital/clinic. If you do not have a mask, one will be given to you upon arrival. For doctor visits, patients may have 1 support person aged 18 or older with them. For treatment visits, patients cannot have anyone with them due to current Covid guidelines and our immunocompromised population.   

## 2021-06-23 ENCOUNTER — Telehealth: Payer: Self-pay | Admitting: Medical Oncology

## 2021-06-23 NOTE — Telephone Encounter (Signed)
Pt reports she has a small itchy bump above her buttocks. I told her to monitor it for now and let us know of any sign of infection , spread or any other concerns.

## 2021-06-23 NOTE — Telephone Encounter (Signed)
LMV to return my call. Pt concerned about her pets sleeping with her and that she sweated last night. Will the sweat  harm the pets . I told her it takes approx 48 hours for the chemotherapy to leave her system and to wait 48 hours after her treatment to let the pets sleep with her.

## 2021-06-24 NOTE — Telephone Encounter (Signed)
Pt called back again and adds the rash is also on her feet. Pt is aware her concern is pending response from Dr. Julien Nordmann at this time.

## 2021-06-24 NOTE — Telephone Encounter (Signed)
Pt has called back stating she "has a red and very itchy rash on my legs". Pt wants to know if this is from her tx and what can be prescribed to help with this?

## 2021-06-27 ENCOUNTER — Other Ambulatory Visit: Payer: Self-pay

## 2021-06-27 ENCOUNTER — Inpatient Hospital Stay: Payer: Medicaid Other

## 2021-06-27 DIAGNOSIS — C3492 Malignant neoplasm of unspecified part of left bronchus or lung: Secondary | ICD-10-CM

## 2021-06-27 DIAGNOSIS — Z5111 Encounter for antineoplastic chemotherapy: Secondary | ICD-10-CM | POA: Diagnosis not present

## 2021-06-27 DIAGNOSIS — Z95828 Presence of other vascular implants and grafts: Secondary | ICD-10-CM

## 2021-06-27 LAB — CMP (CANCER CENTER ONLY)
ALT: 15 U/L (ref 0–44)
AST: 12 U/L — ABNORMAL LOW (ref 15–41)
Albumin: 3.7 g/dL (ref 3.5–5.0)
Alkaline Phosphatase: 59 U/L (ref 38–126)
Anion gap: 6 (ref 5–15)
BUN: 14 mg/dL (ref 6–20)
CO2: 29 mmol/L (ref 22–32)
Calcium: 9.3 mg/dL (ref 8.9–10.3)
Chloride: 100 mmol/L (ref 98–111)
Creatinine: 0.66 mg/dL (ref 0.44–1.00)
GFR, Estimated: >60 mL/min (ref >60)
Glucose, Bld: 115 mg/dL — ABNORMAL HIGH (ref 70–99)
Potassium: 3.5 mmol/L (ref 3.5–5.1)
Sodium: 135 mmol/L (ref 135–145)
Total Bilirubin: 0.6 mg/dL (ref 0.3–1.2)
Total Protein: 7.1 g/dL (ref 6.5–8.1)

## 2021-06-27 LAB — CBC WITH DIFFERENTIAL (CANCER CENTER ONLY)
Abs Immature Granulocytes: 0.07 10*3/uL (ref 0.00–0.07)
Basophils Absolute: 0.1 10*3/uL (ref 0.0–0.1)
Basophils Relative: 1 %
Eosinophils Absolute: 0.1 10*3/uL (ref 0.0–0.5)
Eosinophils Relative: 1 %
HCT: 35.5 % — ABNORMAL LOW (ref 36.0–46.0)
Hemoglobin: 12.6 g/dL (ref 12.0–15.0)
Immature Granulocytes: 1 %
Lymphocytes Relative: 16 %
Lymphs Abs: 1.1 10*3/uL (ref 0.7–4.0)
MCH: 33.1 pg (ref 26.0–34.0)
MCHC: 35.5 g/dL (ref 30.0–36.0)
MCV: 93.2 fL (ref 80.0–100.0)
Monocytes Absolute: 0.2 10*3/uL (ref 0.1–1.0)
Monocytes Relative: 3 %
Neutro Abs: 5.4 10*3/uL (ref 1.7–7.7)
Neutrophils Relative %: 78 %
Platelet Count: 214 10*3/uL (ref 150–400)
RBC: 3.81 MIL/uL — ABNORMAL LOW (ref 3.87–5.11)
RDW: 13.2 % (ref 11.5–15.5)
WBC Count: 6.9 10*3/uL (ref 4.0–10.5)
nRBC: 0 % (ref 0.0–0.2)

## 2021-06-27 LAB — TSH: TSH: 0.494 u[IU]/mL (ref 0.350–4.500)

## 2021-06-27 MED ORDER — SODIUM CHLORIDE 0.9% FLUSH
10.0000 mL | Freq: Once | INTRAVENOUS | Status: AC
  Administered 2021-06-27: 10 mL

## 2021-06-27 MED ORDER — HEPARIN SOD (PORK) LOCK FLUSH 100 UNIT/ML IV SOLN
500.0000 [IU] | Freq: Once | INTRAVENOUS | Status: AC
  Administered 2021-06-27: 500 [IU]

## 2021-06-27 NOTE — Telephone Encounter (Signed)
I have called the pt and left a detailed message advising as indicated.

## 2021-06-28 DIAGNOSIS — C3492 Malignant neoplasm of unspecified part of left bronchus or lung: Secondary | ICD-10-CM | POA: Diagnosis not present

## 2021-07-01 ENCOUNTER — Telehealth: Payer: Self-pay | Admitting: Medical Oncology

## 2021-07-01 NOTE — Telephone Encounter (Signed)
Asking for approval for taking:  -Visine, Tums and Imodium AD  - eating medium cooked hamburger.  I told her they were ok to take.

## 2021-07-05 ENCOUNTER — Other Ambulatory Visit: Payer: Self-pay

## 2021-07-05 ENCOUNTER — Inpatient Hospital Stay: Payer: Medicaid Other

## 2021-07-05 DIAGNOSIS — Z5111 Encounter for antineoplastic chemotherapy: Secondary | ICD-10-CM | POA: Diagnosis not present

## 2021-07-05 DIAGNOSIS — C3492 Malignant neoplasm of unspecified part of left bronchus or lung: Secondary | ICD-10-CM

## 2021-07-05 DIAGNOSIS — Z95828 Presence of other vascular implants and grafts: Secondary | ICD-10-CM

## 2021-07-05 LAB — CMP (CANCER CENTER ONLY)
ALT: 12 U/L (ref 0–44)
AST: 12 U/L — ABNORMAL LOW (ref 15–41)
Albumin: 3.8 g/dL (ref 3.5–5.0)
Alkaline Phosphatase: 56 U/L (ref 38–126)
Anion gap: 6 (ref 5–15)
BUN: 12 mg/dL (ref 6–20)
CO2: 30 mmol/L (ref 22–32)
Calcium: 9.7 mg/dL (ref 8.9–10.3)
Chloride: 101 mmol/L (ref 98–111)
Creatinine: 0.68 mg/dL (ref 0.44–1.00)
GFR, Estimated: >60 mL/min (ref >60)
Glucose, Bld: 111 mg/dL — ABNORMAL HIGH (ref 70–99)
Potassium: 3.5 mmol/L (ref 3.5–5.1)
Sodium: 137 mmol/L (ref 135–145)
Total Bilirubin: 0.4 mg/dL (ref 0.3–1.2)
Total Protein: 7 g/dL (ref 6.5–8.1)

## 2021-07-05 LAB — CBC WITH DIFFERENTIAL (CANCER CENTER ONLY)
Abs Immature Granulocytes: 0.01 10*3/uL (ref 0.00–0.07)
Basophils Absolute: 0.1 10*3/uL (ref 0.0–0.1)
Basophils Relative: 1 %
Eosinophils Absolute: 0.2 10*3/uL (ref 0.0–0.5)
Eosinophils Relative: 4 %
HCT: 34.7 % — ABNORMAL LOW (ref 36.0–46.0)
Hemoglobin: 12.2 g/dL (ref 12.0–15.0)
Immature Granulocytes: 0 %
Lymphocytes Relative: 28 %
Lymphs Abs: 1.4 10*3/uL (ref 0.7–4.0)
MCH: 33.3 pg (ref 26.0–34.0)
MCHC: 35.2 g/dL (ref 30.0–36.0)
MCV: 94.8 fL (ref 80.0–100.0)
Monocytes Absolute: 0.7 10*3/uL (ref 0.1–1.0)
Monocytes Relative: 13 %
Neutro Abs: 2.7 10*3/uL (ref 1.7–7.7)
Neutrophils Relative %: 54 %
Platelet Count: 164 10*3/uL (ref 150–400)
RBC: 3.66 MIL/uL — ABNORMAL LOW (ref 3.87–5.11)
RDW: 14 % (ref 11.5–15.5)
WBC Count: 5 10*3/uL (ref 4.0–10.5)
nRBC: 0 % (ref 0.0–0.2)

## 2021-07-05 MED ORDER — HEPARIN SOD (PORK) LOCK FLUSH 100 UNIT/ML IV SOLN
500.0000 [IU] | Freq: Once | INTRAVENOUS | Status: AC
  Administered 2021-07-05: 500 [IU]

## 2021-07-05 MED ORDER — SODIUM CHLORIDE 0.9% FLUSH
10.0000 mL | Freq: Once | INTRAVENOUS | Status: AC
  Administered 2021-07-05: 10 mL

## 2021-07-06 ENCOUNTER — Ambulatory Visit (HOSPITAL_COMMUNITY)
Admission: RE | Admit: 2021-07-06 | Discharge: 2021-07-06 | Disposition: A | Discharge status: Home / Self Care | Payer: Medicaid Other | Source: Ambulatory Visit | Attending: Physician Assistant | Admitting: Physician Assistant

## 2021-07-06 ENCOUNTER — Telehealth: Payer: Self-pay | Admitting: Medical Oncology

## 2021-07-06 DIAGNOSIS — C3492 Malignant neoplasm of unspecified part of left bronchus or lung: Secondary | ICD-10-CM | POA: Diagnosis not present

## 2021-07-06 MED ORDER — HEPARIN SOD (PORK) LOCK FLUSH 100 UNIT/ML IV SOLN
500.0000 [IU] | Freq: Once | INTRAVENOUS | Status: AC
  Administered 2021-07-06: 500 [IU] via INTRAVENOUS

## 2021-07-06 MED ORDER — HEPARIN SOD (PORK) LOCK FLUSH 100 UNIT/ML IV SOLN
INTRAVENOUS | Status: AC
  Filled 2021-07-06: qty 5

## 2021-07-06 MED ORDER — IOHEXOL 300 MG/ML  SOLN
100.0000 mL | Freq: Once | INTRAMUSCULAR | Status: AC | PRN
Start: 2021-07-06 — End: 2021-07-06
  Administered 2021-07-06: 100 mL via INTRAVENOUS

## 2021-07-06 MED ORDER — IOHEXOL 9 MG/ML PO SOLN
1000.0000 mL | ORAL | Status: AC
  Administered 2021-07-06: 1000 mL via ORAL

## 2021-07-06 MED ORDER — SODIUM CHLORIDE (PF) 0.9 % IJ SOLN
INTRAMUSCULAR | Status: AC
  Filled 2021-07-06: qty 50

## 2021-07-06 NOTE — Telephone Encounter (Signed)
LVM to return my call re diarrhea .

## 2021-07-06 NOTE — Progress Notes (Signed)
[place] OFFICE PROGRESS NOTE  [name], NP [place] [address], [address] [place] [address]  DIAGNOSIS: Extensive stage (T1c, N1, M1c) small cell lung cancer presented with left upper lobe lung nodule in addition to left hilar adenopathy, malignant left pleural effusion in addition to abdominal lymphadenopathy and metastatic bone lesions diagnosed in April 2023  PRIOR THERAPY: None  CURRENT THERAPY: Systemic chemotherapy with carboplatin for AUC of 5 on day 1, etoposide 100 Mg/M2 on days 1, 2 and 3 with Cosela 240 Mg/M2 before the chemotherapy and Imfinzi 1500 Mg IV on day one of the chemotherapy every 3 weeks.  Status post 2 cycles.  She is going to transfer her care to [facility] starting from cycle #3 for consideration of clinical trial with the addition of Taratamab.  INTERVAL HISTORY: Patricia Pena 56 y.o. female returns to the clinic today for a follow-up visit.  The patient is feeling fairly well today without any concerning complaints except for some worsening diarrhea.  She states she is prone to diarrhea ever since having a cholecystectomy.  She estimates she may have 5 loose bowel movements a day.  She states that she has been having diarrhea just about every day.  She has not taken Imodium in the last 2 days.  She has taken Imodium a few of the days but not to the max dose. She noted improvement in her back pain after undergoing chemotherapy which she is pleased about.  Also since last being seen, the patient underwent a therapeutic thoracentesis which yielded 1.2 L of fluid.  She states she has some dyspnea on exertion and is wanting to get her further fusion looked at again in the next few days to see if she needs to have this drained.  The patient has been seeing Dr. Lennart Pall for a second opinion at [facility].  The patient is interested in enrolling in a clinical trial and she would like to treat her cancer aggressively.  Dr. Mariel Kansky is going to evaluate her  eligibility for the addition of taratamab starting from after #4.  She saw Dr. Mariel Kansky for telemedicine visit on 06/28/2021. Dr. Mariel Kansky recommends getting 4 cycles of triplet therapy locally. Further, some data indicate both greater probability of response and lower prob of toxicity when tarlatamab is given with lower bulk, which hopefully will be the case w/4c.   Otherwise, she denies any new concerning complaints today except for fatigue and decreased appetite.  She lost a few pounds since her last appointment.  She is wondering if it is okay to lose weight while undergoing chemo.  She does not drink any supplemental drinks.  She denies any fever, chills, or night sweats.  Denies any unusual cough or hemoptysis.  She continues to have an occasional dry cough.  She reports that her cough is " not all the time". Denies any chest pain. She has some nausea.  Denies any vomiting, diarrhea, or constipation.  She reports her antiemetics are expensive.  She has previously seen social work.  Denies any headache or visual changes besides she states she is overdue for her eye exam and notes her prescription for glasses has been dated for a few years. She notes a few years ago she started having cataracts.  Denies any rashes or skin changes. She recently had a restaging CT scan. She is here for evaluation and repeat blood work before starting cycle #3.    MEDICAL HISTORY: Past Medical History:  Diagnosis Date   Arthritis    Incisional hernia without mention of obstruction or gangrene    Morbid obesity ([place])    Seizures ([place])    last one was in 1985    ALLERGIES:  is allergic to codeine.  MEDICATIONS:  Current Outpatient Medications  Medication Sig Dispense Refill   cholecalciferol (VITAMIN D) 1000 units tablet Take 1,000 Units by mouth daily.     Desoximetasone 0.05 % OINT Apply 1 application. topically daily as needed (For skin rash).  0   furosemide (LASIX) 20 MG tablet Take 1 tablet (20 mg total) by  mouth daily as needed (weight gain of 3lbs in 1 day or 5lbs in 1 week). 10 tablet 0   hydrOXYzine (ATARAX) 10 MG tablet Take 1 tablet (10 mg total) by mouth 3 (three) times daily as needed. 30 tablet 0   lisinopril-hydrochlorothiazide (PRINZIDE,ZESTORETIC) 20-25 MG tablet TAKE 1 TABLET BY MOUTH EVERY DAY (Patient taking differently: Take 1 tablet by mouth daily.) 30 tablet 0   potassium chloride SA (KLOR-CON M) 20 MEQ tablet Take 1 tablet (20 mEq total) by mouth daily. 6 tablet 0   prochlorperazine (COMPAZINE) 10 MG tablet Take 1 tablet (10 mg total) by mouth every 6 (six) hours as needed for nausea or vomiting. 30 tablet 2   CALCIUM-VITAMIN D PO Take 2,000 mg by mouth daily. (Patient not taking: Reported on 07/11/2021)     Cyanocobalamin (VITAMIN B-12 PO) Take 1 tablet by mouth daily. (Patient not taking: Reported on 06/20/2021)     lidocaine-prilocaine (EMLA) cream Apply to the Port-A-Cath site 30 minutes before chemotherapy (Patient not taking: Reported on 06/20/2021) 30 g 0   LORazepam (ATIVAN) 1 MG tablet 1 tablet 30 minutes before MRI of the brain. (Patient not taking: Reported on 06/20/2021) 1 tablet 0   No current facility-administered medications for this visit.    SURGICAL HISTORY:  Past Surgical History:  Procedure Laterality Date   ABDOMINAL HYSTERECTOMY  2010   at [facility] Right 2010   BREAST CYST EXCISION  1994   right breast   BREAST EXCISIONAL BIOPSY Right    benign cyst   GALLBLADDER SURGERY  1990   IR IMAGING GUIDED PORT INSERTION  05/31/2021   LEFT HEART CATHETERIZATION WITH CORONARY ANGIOGRAM N/A 06/14/2011   Procedure: LEFT HEART CATHETERIZATION WITH CORONARY ANGIOGRAM;  Surgeon: Candee Furbish, MD;  Location: [facility] CATH LAB;  Service: Cardiovascular;  Laterality: N/A;   VAGINA SURGERY  2007    REVIEW OF SYSTEMS:   Review of Systems  Constitutional: Positive for fatigue, decreased appetite, weight loss.  Negative for chills and fever.  HENT: Negative for  mouth sores, nosebleeds, sore throat and trouble swallowing.   Eyes: Negative for eye problems and icterus.  Respiratory: Positive for mild occasional cough.  Positive for baseline dyspnea on exertion.  Negative for hemoptysis and wheezing.   Cardiovascular: Negative for chest pain and leg swelling.  Gastrointestinal: Positive for occasional nausea.  Positive for diarrhea.  Negative for abdominal pain, constipation, and vomiting.  Genitourinary: Negative for bladder incontinence, difficulty urinating, dysuria, frequency and hematuria.   Musculoskeletal: Negative for back pain, gait problem, neck pain and neck stiffness.  Skin: Negative for itching and rash.  Neurological: Negative for dizziness, extremity weakness, gait problem, headaches, light-headedness and seizures.  Hematological: Negative for adenopathy. Does not bruise/bleed easily.  Psychiatric/Behavioral: Negative for confusion, depression and sleep disturbance. The patient is not nervous/anxious.     PHYSICAL EXAMINATION:  Blood  pressure (!) 140/92, pulse 89, resp. rate 18, weight 276 lb 2 oz (125.2 kg), SpO2 96 %.  ECOG PERFORMANCE STATUS: 1  Physical Exam  Constitutional: Oriented to person, place, and time and well-developed, well-nourished, and in no distress.  HENT:  Head: Normocephalic and atraumatic.  Mouth/Throat: Oropharynx is clear and moist. No oropharyngeal exudate.  Eyes: Conjunctivae are normal. Right eye exhibits no discharge. Left eye exhibits no discharge. No scleral icterus.  Neck: Normal range of motion. Neck supple.  Cardiovascular: Normal rate, regular rhythm, normal heart sounds and intact distal pulses.   Pulmonary/Chest: Effort normal.  Positive for decreased breath sounds at the base of the left lung.  No respiratory distress. No wheezes. No rales.  Abdominal: Soft. Bowel sounds are normal. Exhibits no distension and no mass. There is no tenderness.  Musculoskeletal: Normal range of motion. Exhibits no  edema.  Lymphadenopathy:    No cervical adenopathy.  Neurological: Alert and oriented to person, place, and time. Exhibits normal muscle tone. Gait normal. Coordination normal.  Skin: Skin is warm and dry. No rash noted. Not diaphoretic. No erythema. No pallor.  Psychiatric: Mood, memory and judgment normal.  Vitals reviewed.  LABORATORY DATA: Lab Results  Component Value Date   WBC 5.3 07/11/2021   HGB 12.5 07/11/2021   HCT 35.3 (L) 07/11/2021   MCV 95.4 07/11/2021   PLT 268 07/11/2021      Chemistry      Component Value Date/Time   NA 137 07/11/2021 0834   NA 143 05/21/2017 1101   K 3.2 (L) 07/11/2021 0834   CL 100 07/11/2021 0834   CO2 29 07/11/2021 0834   BUN 5 (L) 07/11/2021 0834   BUN 13 05/21/2017 1101   CREATININE 0.81 07/11/2021 0834   CREATININE 0.78 08/02/2015 1944      Component Value Date/Time   CALCIUM 9.6 07/11/2021 0834   ALKPHOS 54 07/11/2021 0834   AST 13 (L) 07/11/2021 0834   ALT 11 07/11/2021 0834   BILITOT 0.3 07/11/2021 0834       RADIOGRAPHIC STUDIES:  DG Chest 1 View  Result Date: 06/22/2021 CLINICAL DATA:  LEFT-sided thoracentesis EXAM: CHEST  1 VIEW COMPARISON:  06/20/2021 FINDINGS: Port in the anterior chest wall with tip in distal SVC. Normal cardiac silhouette. Interval reduction in LEFT pleural fluid following thoracentesis. Small LEFT pleural effusion remains. No pneumothorax. IMPRESSION: 1. No pneumothorax following LEFT thoracentesis. 2. Reduction in LEFT pleural effusion. Electronically Signed   By: Suzy Bouchard M.D.   On: 06/22/2021 11:59   DG Chest 2 View  Result Date: 06/20/2021 CLINICAL DATA:  Left lung small cell carcinoma, left pleural effusion EXAM: CHEST - 2 VIEW COMPARISON:  05/30/2021 FINDINGS: Frontal and lateral views of the chest demonstrate a right chest wall port via internal jugular approach tip overlying superior vena cava. Cardiac silhouette is enlarged but stable. Rounded density left upper lobe unchanged in  appearance, consistent with known malignancy. Stable moderate left pleural effusion. No right-sided effusion or pneumothorax. No acute bony abnormalities. IMPRESSION: 1. Stable moderate left pleural effusion. 2. Stable rounded left upper lobe density consistent with known lung cancer. Electronically Signed   By: Randa Ngo M.D.   On: 06/20/2021 15:14   CT Chest W Contrast  Result Date: 07/07/2021 CLINICAL DATA:  Metastatic lung cancer restaging, ongoing chemotherapy, former smoker * Tracking Code: BO * EXAM: CT CHEST, ABDOMEN, AND PELVIS WITH CONTRAST TECHNIQUE: Multidetector CT imaging of the chest, abdomen and pelvis was performed following the standard  protocol during bolus administration of intravenous contrast. RADIATION DOSE REDUCTION: This exam was performed according to the departmental dose-optimization program which includes automated exposure control, adjustment of the mA and/or kV according to patient size and/or use of iterative reconstruction technique. CONTRAST:  158m OMNIPAQUE IOHEXOL 300 MG/ML SOLN, additional oral enteric contrast COMPARISON:  PET-CT, 06/06/2021, CT chest, 05/13/2021 FINDINGS: CT CHEST FINDINGS Cardiovascular: Right chest port catheter. Aortic atherosclerosis. Normal heart size. Left coronary artery calcifications. No pericardial effusion. Mediastinum/Nodes: Interval decrease in size of an enlarged, previously FDG avid left epicardial lymph node, measuring 0.8 cm, previously 1.2 cm (series 2, image 27). Unchanged subcentimeter AP window lymph nodes (series 2, image 19). No other enlarged mediastinal, hilar, or axillary lymph nodes. Thyroid gland, trachea, and esophagus demonstrate no significant findings. Lungs/Pleura: Mild paraseptal emphysema. Interval decrease in size and solid character of a mass of the subpleural anterior left upper lobe, measuring 2.8 x 1.4 cm, previously 3.0 x 1.9 cm (series 4, image 37). Slightly diminished volume of a moderate left pleural effusion  with associated atelectasis or consolidation and diminished pleural thickening and nodularity, index nodule dependently measuring 1.8 x 0.9 cm, previously 3.1 x 1.5 cm (series 2, image 22). Musculoskeletal: No chest wall mass. CT ABDOMEN PELVIS FINDINGS Hepatobiliary: No focal liver abnormality is seen. Status post cholecystectomy. No biliary dilatation. Pancreas: Unremarkable. No pancreatic ductal dilatation or surrounding inflammatory changes. Spleen: Normal in size without significant abnormality. Adrenals/Urinary Tract: Subcentimeter benign adenoma of the left adrenal gland, of macroscopic fatty attenuation and previously not FDG avid (series 2, image 54). Kidneys are normal, without renal calculi, solid lesion, or hydronephrosis. Small calculus noted in the left ovarian vein, which does not reflect a urinary tract calculus (series 2, image 74). Bladder is unremarkable. Stomach/Bowel: Stomach is within normal limits. Appendix appears normal. No evidence of bowel wall thickening, distention, or inflammatory changes. Vascular/Lymphatic: Aortic atherosclerosis. Unchanged prominent left crural lymph node measuring 1.3 x 0.8 cm, previously FDG avid (series 2, image 58). No other enlarged abdominal or pelvic lymph nodes. Reproductive: Status post hysterectomy. Other: No abdominal wall hernia or abnormality. No ascites. Musculoskeletal: No acute osseous findings. Subtle sclerosis of the anterior right fifth rib near the costochondral junction (series 2, image 29). IMPRESSION: 1. Interval decrease in size and solid character of a mass of the subpleural anterior left upper lobe, consistent with treatment response. 2. Slightly diminished volume of a moderate left pleural effusion with associated atelectasis or consolidation and diminished pleural thickening and nodularity, consistent with treatment response of pleural metastatic disease 3. Interval decrease in size of an enlarged, previously FDG avid left epicardial lymph  node, with unchanged subcentimeter AP window and left crural lymph nodes. Findings are consistent with treatment response of nodal metastatic disease. 4. No evidence of soft tissue metastatic disease to the abdomen or pelvis. 5. Subtle sclerosis of the anterior right fifth rib, at the site of FDG avid osseous metastasis. No other CT correlate for previously noted FDG avid osseous metastases. 6. Emphysema. 7. Coronary artery disease. Aortic Atherosclerosis (ICD10-I70.0) and Emphysema (ICD10-J43.9). Electronically Signed   By: ADelanna AhmadiM.D.   On: 07/07/2021 14:02   MR Brain W Wo Contrast  Result Date: 06/15/2021 CLINICAL DATA:  Small cell lung cancer staging EXAM: MRI HEAD WITHOUT AND WITH CONTRAST TECHNIQUE: Multiplanar, multiecho pulse sequences of the brain and surrounding structures were obtained without and with intravenous contrast. CONTRAST:  13mGADAVIST GADOBUTROL 1 MMOL/ML IV SOLN COMPARISON:  None Available. FINDINGS: Brain: No acute  infarction, hemorrhage, hydrocephalus, extra-axial collection or mass lesion. Few tiny white matter hyperintensities which do not enhance likely due to chronic microvascular ischemia. Negative for metastatic disease to brain. No enhancing brain lesion identified. Vascular: Normal arterial flow voids. Skull and upper cervical spine: 2 cm enhancing lesion in the left parietal bone over the convexity. Probable metastatic deposit. Sinuses/Orbits: Paranasal sinuses clear.  Negative orbit Other: None IMPRESSION: 1. Negative for metastatic disease to brain 2. 2 cm enhancing lesion left parietal bone over the convexity likely metastatic disease. Electronically Signed   By: Franchot Gallo M.D.   On: 06/15/2021 11:51   CT Abdomen Pelvis W Contrast  Result Date: 07/07/2021 CLINICAL DATA:  Metastatic lung cancer restaging, ongoing chemotherapy, former smoker * Tracking Code: BO * EXAM: CT CHEST, ABDOMEN, AND PELVIS WITH CONTRAST TECHNIQUE: Multidetector CT imaging of the  chest, abdomen and pelvis was performed following the standard protocol during bolus administration of intravenous contrast. RADIATION DOSE REDUCTION: This exam was performed according to the departmental dose-optimization program which includes automated exposure control, adjustment of the mA and/or kV according to patient size and/or use of iterative reconstruction technique. CONTRAST:  143mL OMNIPAQUE IOHEXOL 300 MG/ML SOLN, additional oral enteric contrast COMPARISON:  PET-CT, 06/06/2021, CT chest, 05/13/2021 FINDINGS: CT CHEST FINDINGS Cardiovascular: Right chest port catheter. Aortic atherosclerosis. Normal heart size. Left coronary artery calcifications. No pericardial effusion. Mediastinum/Nodes: Interval decrease in size of an enlarged, previously FDG avid left epicardial lymph node, measuring 0.8 cm, previously 1.2 cm (series 2, image 27). Unchanged subcentimeter AP window lymph nodes (series 2, image 19). No other enlarged mediastinal, hilar, or axillary lymph nodes. Thyroid gland, trachea, and esophagus demonstrate no significant findings. Lungs/Pleura: Mild paraseptal emphysema. Interval decrease in size and solid character of a mass of the subpleural anterior left upper lobe, measuring 2.8 x 1.4 cm, previously 3.0 x 1.9 cm (series 4, image 37). Slightly diminished volume of a moderate left pleural effusion with associated atelectasis or consolidation and diminished pleural thickening and nodularity, index nodule dependently measuring 1.8 x 0.9 cm, previously 3.1 x 1.5 cm (series 2, image 22). Musculoskeletal: No chest wall mass. CT ABDOMEN PELVIS FINDINGS Hepatobiliary: No focal liver abnormality is seen. Status post cholecystectomy. No biliary dilatation. Pancreas: Unremarkable. No pancreatic ductal dilatation or surrounding inflammatory changes. Spleen: Normal in size without significant abnormality. Adrenals/Urinary Tract: Subcentimeter benign adenoma of the left adrenal gland, of macroscopic fatty  attenuation and previously not FDG avid (series 2, image 54). Kidneys are normal, without renal calculi, solid lesion, or hydronephrosis. Small calculus noted in the left ovarian vein, which does not reflect a urinary tract calculus (series 2, image 74). Bladder is unremarkable. Stomach/Bowel: Stomach is within normal limits. Appendix appears normal. No evidence of bowel wall thickening, distention, or inflammatory changes. Vascular/Lymphatic: Aortic atherosclerosis. Unchanged prominent left crural lymph node measuring 1.3 x 0.8 cm, previously FDG avid (series 2, image 58). No other enlarged abdominal or pelvic lymph nodes. Reproductive: Status post hysterectomy. Other: No abdominal wall hernia or abnormality. No ascites. Musculoskeletal: No acute osseous findings. Subtle sclerosis of the anterior right fifth rib near the costochondral junction (series 2, image 29). IMPRESSION: 1. Interval decrease in size and solid character of a mass of the subpleural anterior left upper lobe, consistent with treatment response. 2. Slightly diminished volume of a moderate left pleural effusion with associated atelectasis or consolidation and diminished pleural thickening and nodularity, consistent with treatment response of pleural metastatic disease 3. Interval decrease in size of an enlarged, previously  FDG avid left epicardial lymph node, with unchanged subcentimeter AP window and left crural lymph nodes. Findings are consistent with treatment response of nodal metastatic disease. 4. No evidence of soft tissue metastatic disease to the abdomen or pelvis. 5. Subtle sclerosis of the anterior right fifth rib, at the site of FDG avid osseous metastasis. No other CT correlate for previously noted FDG avid osseous metastases. 6. Emphysema. 7. Coronary artery disease. Aortic Atherosclerosis (ICD10-I70.0) and Emphysema (ICD10-J43.9). Electronically Signed   By: Delanna Ahmadi M.D.   On: 07/07/2021 14:02   US Thoracentesis Asp Pleural  space w/IMG guide  Result Date: 06/22/2021 INDICATION: Patient with history of metastatic small cell lung cancer, dyspnea, recurrent left pleural effusion. Request received for therapeutic left thoracentesis. EXAM: ULTRASOUND GUIDED THERAPEUTIC LEFT THORACENTESIS MEDICATIONS: 10 mL 1% lidocaine COMPLICATIONS: None immediate. PROCEDURE: An ultrasound guided thoracentesis was thoroughly discussed with the patient and questions answered. The benefits, risks, alternatives and complications were also discussed. The patient understands and wishes to proceed with the procedure. Written consent was obtained. Ultrasound was performed to localize and mark an adequate pocket of fluid in the left chest. The area was then prepped and draped in the normal sterile fashion. 1% Lidocaine was used for local anesthesia. Under ultrasound guidance a 6 Fr Safe-T-Centesis catheter was introduced. Thoracentesis was performed. The catheter was removed and a dressing applied. FINDINGS: A total of approximately 1.2 liters of blood-tinged fluid was removed. IMPRESSION: Successful ultrasound guided therapeutic LEFT thoracentesis yielding 1.2 liters of pleural fluid. Read by: Rowe Robert, PA-C Electronically Signed   By: Michaelle Birks M.D.   On: 06/22/2021 17:06     ASSESSMENT/PLAN:  This is a very pleasant 56 year old Caucasian female diagnosed with extensive stage (T1c, N1, M1 C) small cell lung cancer.  She presented with a left upper lobe lung nodule in addition to left hilar adenopathy, malignant left pleural effusion, abdominal adenopathy, and metastatic lesions to the bone.  She was diagnosed in April 2023.  The patient recently had a brain MRI performed to complete the staging work-up which was negative for any intracranial metastatic disease but she did have a parietal bone lesion which is part of her known extensive metastatic disease to the bone.  Dr. Julien Nordmann previously reviewed this with the patient.  The patient is  currently undergoing palliative systemic chemotherapy with carboplatin for an AUC of 5 on day 1, etoposide 100 mg per metered squared on days 1, 2, and 3 and immunotherapy with Imfinzi.  She met with Dr. Mariel Kansky from [facility]. The patient would like to enroll in a clinical trial. She would like to treat her cancer aggressively. Dr. Mariel Kansky is going to evaluate her eligibility for the addition of Taratamab to her treatment starting from after cycle #4.  She is going to receive cycle #4 locally before transitioning her care.  Patient recently had a restaging CT scan the scan showed a positive response to treatment.  I reviewed this with the patient and gave her a copy.  She will proceed with cycle #3 today.   We will see her back for a follow up visit in 3 weeks before starting cycle #4.   Regarding the patient's diarrhea, her potassium is slightly low today likely due to the diarrhea.  I have sent her prescription for 20 mEq p.o. daily for 6 days.  I will also arrange for her to receive 1 L of fluid over 2 hours while in the infusion room today.  Of note, I reviewed  the patient's restaging CT scan which did not note any inflammatory changes in the bowel or signs of colitis.  I reviewed the dosing instructions with the patient for Imodium.  I also encouraged her to take 1 tablet after every loose stool up until a max of 16 mg of Imodium.  I gave the patient precautions that if she continues to have 5-6 loose stools a day and/or new or worsening symptoms such as abdominal pain, blood in the stool, or fevers to please call the clinic for further re-evaluation of her diarrhea.  The patient notes that she is prone to diarrhea after having a cholecystectomy  The patient has a history of pleural effusion.  She had this drained last month on 5/17 which obtained 1.2 L of fluid.  There were only decreased breath sounds in the base of the left lung.  Unclear how much fluid is present.  The patient does want to have this  reevaluated with interventional radiology later this week and a thoracentesis if needed.  I will place the order.  I reviewed with the patient that they use an ultrasound prior to the procedure to assess the amount of fluid.  If there is not a significant amount of fluid then we will cancel the procedure.  We will reach out to social work and the financial advocates to see if the patient qualifies for any medication cards to make her antiemetics more affordable.  Regarding the patient's decreased appetite and decreased hydration, I encouraged her to carry water bottles with her to remind her to drink plenty of fluids.  I also encouraged her to increase her caloric intake of small frequent meals and to use supplemental drinks such as boost or Ensure if needed.  The patient was advised to call immediately if she has any concerning symptoms in the interval. The patient voices understanding of current disease status and treatment options and is in agreement with the current care plan. All questions were answered. The patient knows to call the clinic with any problems, questions or concerns. We can certainly see the patient much sooner if necessary        Orders Placed This Encounter  Procedures   US Thoracentesis Asp Pleural space w/IMG guide    Standing Status:   Future    Standing Expiration Date:   07/11/2022    Order Specific Question:   Are labs required for specimen collection?    Answer:   No    Order Specific Question:   Reason for Exam (SYMPTOM  OR DIAGNOSIS REQUIRED)    Answer:   Small cell lung lung cancer, pleural effusions. She wants it reassessed    Order Specific Question:   Preferred imaging location?    Answer:   [facility]     The total time spent in the appointment was 30-39 minutes.   Hansel Devan L Mekiyah Gladwell, PA-C 07/11/21

## 2021-07-06 NOTE — Telephone Encounter (Signed)
Pt called wanting to know if she can take regular Imodium. She has been advised again, yes she can.

## 2021-07-07 ENCOUNTER — Encounter: Payer: Self-pay | Admitting: Internal Medicine

## 2021-07-11 ENCOUNTER — Inpatient Hospital Stay: Payer: Medicaid Other

## 2021-07-11 ENCOUNTER — Other Ambulatory Visit: Payer: Self-pay | Admitting: Physician Assistant

## 2021-07-11 ENCOUNTER — Encounter: Payer: Self-pay | Admitting: Internal Medicine

## 2021-07-11 ENCOUNTER — Other Ambulatory Visit: Payer: Self-pay

## 2021-07-11 ENCOUNTER — Inpatient Hospital Stay: Payer: Medicaid Other | Attending: Internal Medicine | Admitting: Physician Assistant

## 2021-07-11 ENCOUNTER — Encounter: Payer: Self-pay | Admitting: Physician Assistant

## 2021-07-11 ENCOUNTER — Other Ambulatory Visit (HOSPITAL_COMMUNITY): Payer: Self-pay

## 2021-07-11 ENCOUNTER — Other Ambulatory Visit: Payer: Self-pay | Admitting: Hematology & Oncology

## 2021-07-11 VITALS — BP 140/92 | HR 89 | Resp 18 | Wt 276.1 lb

## 2021-07-11 DIAGNOSIS — Z5112 Encounter for antineoplastic immunotherapy: Secondary | ICD-10-CM

## 2021-07-11 DIAGNOSIS — C3412 Malignant neoplasm of upper lobe, left bronchus or lung: Secondary | ICD-10-CM | POA: Insufficient documentation

## 2021-07-11 DIAGNOSIS — Z5111 Encounter for antineoplastic chemotherapy: Secondary | ICD-10-CM

## 2021-07-11 DIAGNOSIS — J910 Malignant pleural effusion: Secondary | ICD-10-CM | POA: Diagnosis not present

## 2021-07-11 DIAGNOSIS — Z95828 Presence of other vascular implants and grafts: Secondary | ICD-10-CM

## 2021-07-11 DIAGNOSIS — R197 Diarrhea, unspecified: Secondary | ICD-10-CM

## 2021-07-11 DIAGNOSIS — C3492 Malignant neoplasm of unspecified part of left bronchus or lung: Secondary | ICD-10-CM

## 2021-07-11 DIAGNOSIS — E876 Hypokalemia: Secondary | ICD-10-CM | POA: Diagnosis not present

## 2021-07-11 DIAGNOSIS — Z79899 Other long term (current) drug therapy: Secondary | ICD-10-CM | POA: Diagnosis not present

## 2021-07-11 LAB — CBC WITH DIFFERENTIAL (CANCER CENTER ONLY)
Abs Immature Granulocytes: 0.11 10*3/uL — ABNORMAL HIGH (ref 0.00–0.07)
Basophils Absolute: 0.1 10*3/uL (ref 0.0–0.1)
Basophils Relative: 2 %
Eosinophils Absolute: 0.1 10*3/uL (ref 0.0–0.5)
Eosinophils Relative: 2 %
HCT: 35.3 % — ABNORMAL LOW (ref 36.0–46.0)
Hemoglobin: 12.5 g/dL (ref 12.0–15.0)
Immature Granulocytes: 2 %
Lymphocytes Relative: 31 %
Lymphs Abs: 1.7 10*3/uL (ref 0.7–4.0)
MCH: 33.8 pg (ref 26.0–34.0)
MCHC: 35.4 g/dL (ref 30.0–36.0)
MCV: 95.4 fL (ref 80.0–100.0)
Monocytes Absolute: 0.8 10*3/uL (ref 0.1–1.0)
Monocytes Relative: 15 %
Neutro Abs: 2.6 10*3/uL (ref 1.7–7.7)
Neutrophils Relative %: 48 %
Platelet Count: 268 10*3/uL (ref 150–400)
RBC: 3.7 MIL/uL — ABNORMAL LOW (ref 3.87–5.11)
RDW: 14.4 % (ref 11.5–15.5)
WBC Count: 5.3 10*3/uL (ref 4.0–10.5)
nRBC: 0 % (ref 0.0–0.2)

## 2021-07-11 LAB — CMP (CANCER CENTER ONLY)
ALT: 11 U/L (ref 0–44)
AST: 13 U/L — ABNORMAL LOW (ref 15–41)
Albumin: 3.7 g/dL (ref 3.5–5.0)
Alkaline Phosphatase: 54 U/L (ref 38–126)
Anion gap: 8 (ref 5–15)
BUN: 5 mg/dL — ABNORMAL LOW (ref 6–20)
CO2: 29 mmol/L (ref 22–32)
Calcium: 9.6 mg/dL (ref 8.9–10.3)
Chloride: 100 mmol/L (ref 98–111)
Creatinine: 0.81 mg/dL (ref 0.44–1.00)
GFR, Estimated: >60 mL/min (ref >60)
Glucose, Bld: 166 mg/dL — ABNORMAL HIGH (ref 70–99)
Potassium: 3.2 mmol/L — ABNORMAL LOW (ref 3.5–5.1)
Sodium: 137 mmol/L (ref 135–145)
Total Bilirubin: 0.3 mg/dL (ref 0.3–1.2)
Total Protein: 6.6 g/dL (ref 6.5–8.1)

## 2021-07-11 MED ORDER — ONDANSETRON HCL 8 MG PO TABS
8.0000 mg | ORAL_TABLET | Freq: Three times a day (TID) | ORAL | 1 refills | Status: AC | PRN
  Filled 2021-07-11: qty 30, 10d supply, fill #0

## 2021-07-11 MED ORDER — SODIUM CHLORIDE 0.9 % IV SOLN
10.0000 mg | Freq: Once | INTRAVENOUS | Status: AC
  Administered 2021-07-11: 10 mg via INTRAVENOUS
  Filled 2021-07-11: qty 10

## 2021-07-11 MED ORDER — PALONOSETRON HCL INJECTION 0.25 MG/5ML
INTRAVENOUS | Status: AC
  Filled 2021-07-11: qty 5

## 2021-07-11 MED ORDER — SODIUM CHLORIDE 0.9% FLUSH
10.0000 mL | Freq: Once | INTRAVENOUS | Status: AC
  Administered 2021-07-11: 10 mL

## 2021-07-11 MED ORDER — SODIUM CHLORIDE 0.9 % IV SOLN
750.0000 mg | Freq: Once | INTRAVENOUS | Status: AC
  Administered 2021-07-11: 750 mg via INTRAVENOUS
  Filled 2021-07-11: qty 75

## 2021-07-11 MED ORDER — TRILACICLIB DIHYDROCHLORIDE INJECTION 300 MG
240.0000 mg/m2 | Freq: Once | INTRAVENOUS | Status: AC
  Administered 2021-07-11: 600 mg via INTRAVENOUS
  Filled 2021-07-11: qty 40

## 2021-07-11 MED ORDER — SODIUM CHLORIDE 0.9 % IV SOLN
1500.0000 mg | Freq: Once | INTRAVENOUS | Status: AC
  Administered 2021-07-11: 1500 mg via INTRAVENOUS
  Filled 2021-07-11: qty 30

## 2021-07-11 MED ORDER — PROCHLORPERAZINE MALEATE 10 MG PO TABS
10.0000 mg | ORAL_TABLET | Freq: Four times a day (QID) | ORAL | 2 refills | Status: AC | PRN
  Filled 2021-07-11: qty 30, 8d supply, fill #0

## 2021-07-11 MED ORDER — HEPARIN SOD (PORK) LOCK FLUSH 100 UNIT/ML IV SOLN
500.0000 [IU] | Freq: Once | INTRAVENOUS | Status: AC | PRN
  Administered 2021-07-11: 500 [IU]

## 2021-07-11 MED ORDER — POTASSIUM CHLORIDE CRYS ER 20 MEQ PO TBCR: 20.0000 meq | EXTENDED_RELEASE_TABLET | Freq: Every day | ORAL | 0 refills | Status: DC

## 2021-07-11 MED ORDER — SODIUM CHLORIDE 0.9 % IV SOLN
100.0000 mg/m2 | Freq: Once | INTRAVENOUS | Status: AC
  Administered 2021-07-11: 240 mg via INTRAVENOUS
  Filled 2021-07-11: qty 12

## 2021-07-11 MED ORDER — SODIUM CHLORIDE 0.9 % IV SOLN: Freq: Once | INTRAVENOUS | Status: AC

## 2021-07-11 MED ORDER — SODIUM CHLORIDE 0.9 % IV SOLN
150.0000 mg | Freq: Once | INTRAVENOUS | Status: AC
  Administered 2021-07-11: 150 mg via INTRAVENOUS
  Filled 2021-07-11: qty 150

## 2021-07-11 MED ORDER — PALONOSETRON HCL INJECTION 0.25 MG/5ML
0.2500 mg | Freq: Once | INTRAVENOUS | Status: AC
  Administered 2021-07-11: 0.25 mg via INTRAVENOUS

## 2021-07-11 MED ORDER — SODIUM CHLORIDE 0.9% FLUSH
10.0000 mL | INTRAVENOUS | Status: DC | PRN
  Administered 2021-07-11: 10 mL

## 2021-07-11 NOTE — Patient Instructions (Signed)
[facility]COL[facility]  Discharge Instructions: Thank you for choosing [facility] to provide your oncology and hematology care.   If you have a lab appointment with the [facility], please go directly to the [facility] and check in at the registration area.   Wear comfortable clothing and clothing appropriate for easy access to any Portacath or PICC line.   We strive to give you quality time with your provider. You may need to reschedule your appointment if you arrive late (15 or more minutes).  Arriving late affects you and other patients whose appointments are after yours.  Also, if you miss three or more appointments without notifying the office, you may be dismissed from the clinic at the provider's discretion.      For prescription refill requests, have your pharmacy contact our office and allow 72 hours for refills to be completed.    Today you received the following chemotherapy and/or immunotherapy agents: Imfinzi, Carboplatin, Etoposide, Cosela     To help prevent nausea and vomiting after your treatment, we encourage you to take your nausea medication as directed.  BELOW ARE SYMPTOMS THAT SHOULD BE REPORTED IMMEDIATELY: *FEVER GREATER THAN 100.4 F (38 C) OR HIGHER *CHILLS OR SWEATING *NAUSEA AND VOMITING THAT IS NOT CONTROLLED WITH YOUR NAUSEA MEDICATION *UNUSUAL SHORTNESS OF BREATH *UNUSUAL BRUISING OR BLEEDING *URINARY PROBLEMS (pain or burning when urinating, or frequent urination) *BOWEL PROBLEMS (unusual diarrhea, constipation, pain near the anus) TENDERNESS IN MOUTH AND THROAT WITH OR WITHOUT PRESENCE OF ULCERS (sore throat, sores in mouth, or a toothache) UNUSUAL RASH, SWELLING OR PAIN  UNUSUAL VAGINAL DISCHARGE OR ITCHING   Items with * indicate a potential emergency and should be followed up as soon as possible or go to the Emergency Department if any problems should occur.  Please show the CHEMOTHERAPY ALERT CARD or  IMMUNOTHERAPY ALERT CARD at check-in to the Emergency Department and triage nurse.  Should you have questions after your visit or need to cancel or reschedule your appointment, please contact [facility]  Dept: [phone]  and follow the prompts.  Office hours are 8:00 a.m. to 4:30 p.m. Monday - Friday. Please note that voicemails left after 4:00 p.m. may not be returned until the following business day.  We are closed weekends and major holidays. You have access to a nurse at all times for urgent questions. Please call the main number to the clinic Dept: [phone] and follow the prompts.   For any non-urgent questions, you may also contact your provider using MyChart. We now offer e-Visits for anyone 83 and older to request care online for non-urgent symptoms. For details visit [email or web address].   Also download the MyChart app! Go to the app store, search "MyChart", open the app, select [facility], and log in with your MyChart username and password.  Due to Covid, a mask is required upon entering the hospital/clinic. If you do not have a mask, one will be given to you upon arrival. For doctor visits, patients may have 1 support Patricia Pena aged 58 or older with them. For treatment visits, patients cannot have anyone with them due to current Covid guidelines and our immunocompromised population.

## 2021-07-12 ENCOUNTER — Inpatient Hospital Stay: Payer: Medicaid Other

## 2021-07-12 VITALS — BP 104/83 | HR 93 | Temp 98.0°F | Resp 18

## 2021-07-12 DIAGNOSIS — C3492 Malignant neoplasm of unspecified part of left bronchus or lung: Secondary | ICD-10-CM

## 2021-07-12 DIAGNOSIS — Z5111 Encounter for antineoplastic chemotherapy: Secondary | ICD-10-CM | POA: Diagnosis not present

## 2021-07-12 MED ORDER — SODIUM CHLORIDE 0.9% FLUSH
10.0000 mL | INTRAVENOUS | Status: DC | PRN
  Administered 2021-07-12: 10 mL

## 2021-07-12 MED ORDER — TRILACICLIB DIHYDROCHLORIDE INJECTION 300 MG
240.0000 mg/m2 | Freq: Once | INTRAVENOUS | Status: AC
  Administered 2021-07-12: 600 mg via INTRAVENOUS
  Filled 2021-07-12: qty 40

## 2021-07-12 MED ORDER — SODIUM CHLORIDE 0.9 % IV SOLN
10.0000 mg | Freq: Once | INTRAVENOUS | Status: AC
  Administered 2021-07-12: 10 mg via INTRAVENOUS
  Filled 2021-07-12: qty 10

## 2021-07-12 MED ORDER — SODIUM CHLORIDE 0.9 % IV SOLN: Freq: Once | INTRAVENOUS | Status: AC

## 2021-07-12 MED ORDER — SODIUM CHLORIDE 0.9 % IV SOLN
100.0000 mg/m2 | Freq: Once | INTRAVENOUS | Status: AC
  Administered 2021-07-12: 240 mg via INTRAVENOUS
  Filled 2021-07-12: qty 12

## 2021-07-12 MED ORDER — HEPARIN SOD (PORK) LOCK FLUSH 100 UNIT/ML IV SOLN
500.0000 [IU] | Freq: Once | INTRAVENOUS | Status: AC | PRN
  Administered 2021-07-12: 500 [IU]

## 2021-07-12 MED FILL — Dexamethasone Sodium Phosphate Inj 100 MG/10ML: INTRAMUSCULAR | Qty: 1 | Status: AC

## 2021-07-12 NOTE — Patient Instructions (Signed)
[facility]COLOG[facility]  Discharge Instructions: Thank you for choosing [facility] to provide your oncology and hematology care.   If you have a lab appointment with the [facility], please go directly to the [facility] and check in at the registration area.   Wear comfortable clothing and clothing appropriate for easy access to any Portacath or PICC line.   We strive to give you quality time with your provider. You may need to reschedule your appointment if you arrive late (15 or more minutes).  Arriving late affects you and other patients whose appointments are after yours.  Also, if you miss three or more appointments without notifying the office, you may be dismissed from the clinic at the provider's discretion.      For prescription refill requests, have your pharmacy contact our office and allow 72 hours for refills to be completed.    Today you received the following chemotherapy and/or immunotherapy agents: Etoposide & Cytoxan      To help prevent nausea and vomiting after your treatment, we encourage you to take your nausea medication as directed.  BELOW ARE SYMPTOMS THAT SHOULD BE REPORTED IMMEDIATELY: *FEVER GREATER THAN 100.4 F (38 C) OR HIGHER *CHILLS OR SWEATING *NAUSEA AND VOMITING THAT IS NOT CONTROLLED WITH YOUR NAUSEA MEDICATION *UNUSUAL SHORTNESS OF BREATH *UNUSUAL BRUISING OR BLEEDING *URINARY PROBLEMS (pain or burning when urinating, or frequent urination) *BOWEL PROBLEMS (unusual diarrhea, constipation, pain near the anus) TENDERNESS IN MOUTH AND THROAT WITH OR WITHOUT PRESENCE OF ULCERS (sore throat, sores in mouth, or a toothache) UNUSUAL RASH, SWELLING OR PAIN  UNUSUAL VAGINAL DISCHARGE OR ITCHING   Items with * indicate a potential emergency and should be followed up as soon as possible or go to the Emergency Department if any problems should occur.  Please show the CHEMOTHERAPY ALERT CARD or IMMUNOTHERAPY ALERT CARD at  check-in to the Emergency Department and triage nurse.  Should you have questions after your visit or need to cancel or reschedule your appointment, please contact [facility]  Dept: [phone]  and follow the prompts.  Office hours are 8:00 a.m. to 4:30 p.m. Monday - Friday. Please note that voicemails left after 4:00 p.m. may not be returned until the following business day.  We are closed weekends and major holidays. You have access to a nurse at all times for urgent questions. Please call the main number to the clinic Dept: [phone] and follow the prompts.   For any non-urgent questions, you may also contact your provider using MyChart. We now offer e-Visits for anyone 71 and older to request care online for non-urgent symptoms. For details visit [email or web address].   Also download the MyChart app! Go to the app store, search "MyChart", open the app, select [facility], and log in with your MyChart username and password.  Due to Covid, a mask is required upon entering the hospital/clinic. If you do not have a mask, one will be given to you upon arrival. For doctor visits, patients may have 1 support Haskel Dewalt aged 19 or older with them. For treatment visits, patients cannot have anyone with them due to current Covid guidelines and our immunocompromised population.

## 2021-07-13 ENCOUNTER — Other Ambulatory Visit: Payer: Self-pay

## 2021-07-13 ENCOUNTER — Inpatient Hospital Stay: Payer: Medicaid Other

## 2021-07-13 VITALS — BP 116/52 | HR 83 | Temp 97.7°F | Resp 20 | Wt 282.8 lb

## 2021-07-13 DIAGNOSIS — Z5111 Encounter for antineoplastic chemotherapy: Secondary | ICD-10-CM | POA: Diagnosis not present

## 2021-07-13 DIAGNOSIS — C3492 Malignant neoplasm of unspecified part of left bronchus or lung: Secondary | ICD-10-CM

## 2021-07-13 MED ORDER — TRILACICLIB DIHYDROCHLORIDE INJECTION 300 MG
240.0000 mg/m2 | Freq: Once | INTRAVENOUS | Status: AC
  Administered 2021-07-13: 600 mg via INTRAVENOUS
  Filled 2021-07-13: qty 40

## 2021-07-13 MED ORDER — SODIUM CHLORIDE 0.9 % IV SOLN
100.0000 mg/m2 | Freq: Once | INTRAVENOUS | Status: AC
  Administered 2021-07-13: 240 mg via INTRAVENOUS
  Filled 2021-07-13: qty 12

## 2021-07-13 MED ORDER — SODIUM CHLORIDE 0.9 % IV SOLN
10.0000 mg | Freq: Once | INTRAVENOUS | Status: AC
  Administered 2021-07-13: 10 mg via INTRAVENOUS
  Filled 2021-07-13: qty 10

## 2021-07-13 MED ORDER — SODIUM CHLORIDE 0.9 % IV SOLN: Freq: Once | INTRAVENOUS | Status: AC

## 2021-07-13 MED ORDER — SODIUM CHLORIDE 0.9% FLUSH
10.0000 mL | INTRAVENOUS | Status: DC | PRN
  Administered 2021-07-13: 10 mL

## 2021-07-13 MED ORDER — HEPARIN SOD (PORK) LOCK FLUSH 100 UNIT/ML IV SOLN
500.0000 [IU] | Freq: Once | INTRAVENOUS | Status: AC | PRN
  Administered 2021-07-13: 500 [IU]

## 2021-07-13 NOTE — Patient Instructions (Signed)
[facility] ONCOLOGY  Discharge Instructions: Thank you for choosing [facility] to provide your oncology and hematology care.   If you have a lab appointment with the [facility], please go directly to the [facility] and check in at the registration area.   Wear comfortable clothing and clothing appropriate for easy access to any Portacath or PICC line.   We strive to give you quality time with your provider. You may need to reschedule your appointment if you arrive late (15 or more minutes).  Arriving late affects you and other patients whose appointments are after yours.  Also, if you miss three or more appointments without notifying the office, you may be dismissed from the clinic at the provider's discretion.      For prescription refill requests, have your pharmacy contact our office and allow 72 hours for refills to be completed.    Today you received the following chemotherapy and/or immunotherapy agents: Cosela & Etoposide      To help prevent nausea and vomiting after your treatment, we encourage you to take your nausea medication as directed.  BELOW ARE SYMPTOMS THAT SHOULD BE REPORTED IMMEDIATELY: *FEVER GREATER THAN 100.4 F (38 C) OR HIGHER *CHILLS OR SWEATING *NAUSEA AND VOMITING THAT IS NOT CONTROLLED WITH YOUR NAUSEA MEDICATION *UNUSUAL SHORTNESS OF BREATH *UNUSUAL BRUISING OR BLEEDING *URINARY PROBLEMS (pain or burning when urinating, or frequent urination) *BOWEL PROBLEMS (unusual diarrhea, constipation, pain near the anus) TENDERNESS IN MOUTH AND THROAT WITH OR WITHOUT PRESENCE OF ULCERS (sore throat, sores in mouth, or a toothache) UNUSUAL RASH, SWELLING OR PAIN  UNUSUAL VAGINAL DISCHARGE OR ITCHING   Items with * indicate a potential emergency and should be followed up as soon as possible or go to the Emergency Department if any problems should occur.  Please show the CHEMOTHERAPY ALERT CARD or IMMUNOTHERAPY ALERT CARD at  check-in to the Emergency Department and triage nurse.  Should you have questions after your visit or need to cancel or reschedule your appointment, please contact [facility]  Dept: [phone]  and follow the prompts.  Office hours are 8:00 a.m. to 4:30 p.m. Monday - Friday. Please note that voicemails left after 4:00 p.m. may not be returned until the following business day.  We are closed weekends and major holidays. You have access to a nurse at all times for urgent questions. Please call the main number to the clinic Dept: [phone] and follow the prompts.   For any non-urgent questions, you may also contact your provider using MyChart. We now offer e-Visits for anyone 68 and older to request care online for non-urgent symptoms. For details visit [email or web address].   Also download the MyChart app! Go to the app store, search "MyChart", open the app, select [facility], and log in with your MyChart username and password.  Due to Covid, a mask is required upon entering the hospital/clinic. If you do not have a mask, one will be given to you upon arrival. For doctor visits, patients may have 1 support person aged 13 or older with them. For treatment visits, patients cannot have anyone with them due to current Covid guidelines and our immunocompromised population.

## 2021-07-14 ENCOUNTER — Ambulatory Visit (HOSPITAL_COMMUNITY)
Admission: RE | Admit: 2021-07-14 | Discharge: 2021-07-14 | Disposition: A | Discharge status: Home / Self Care | Payer: Medicaid Other | Source: Ambulatory Visit | Attending: Physician Assistant | Admitting: Physician Assistant

## 2021-07-14 ENCOUNTER — Telehealth: Payer: Self-pay | Admitting: Internal Medicine

## 2021-07-14 ENCOUNTER — Other Ambulatory Visit (HOSPITAL_COMMUNITY): Payer: Self-pay | Admitting: Internal Medicine

## 2021-07-14 ENCOUNTER — Ambulatory Visit (HOSPITAL_COMMUNITY)
Admission: RE | Admit: 2021-07-14 | Discharge: 2021-07-14 | Disposition: A | Discharge status: Home / Self Care | Payer: Self-pay | Source: Ambulatory Visit | Attending: Internal Medicine | Admitting: Internal Medicine

## 2021-07-14 DIAGNOSIS — J90 Pleural effusion, not elsewhere classified: Secondary | ICD-10-CM

## 2021-07-14 DIAGNOSIS — J910 Malignant pleural effusion: Secondary | ICD-10-CM | POA: Insufficient documentation

## 2021-07-14 DIAGNOSIS — C3492 Malignant neoplasm of unspecified part of left bronchus or lung: Secondary | ICD-10-CM | POA: Insufficient documentation

## 2021-07-14 MED ORDER — LIDOCAINE HCL 1 % IJ SOLN
INTRAMUSCULAR | Status: AC
  Administered 2021-07-14: 15 mL
  Filled 2021-07-14: qty 20

## 2021-07-14 NOTE — Procedures (Signed)
PROCEDURE SUMMARY:  Successful US guided therapeutic left thoracentesis. Yielded 1.1 L of hazy, amber fluid. Pt tolerated procedure well. No immediate complications.  Specimen not sent for labs. CXR ordered.  EBL < 1 mL  Tyson Alias, AGNP 07/14/2021 3:42 PM

## 2021-07-14 NOTE — Progress Notes (Signed)
Attempted to locate pt in radiology waiting area after CXR resulted to notify her that study was normal and she was cleared to leave. Unable to locate pt. Call to pt cell phone in chart was unsuccessful as well.    Narda Rutherford, AGNP-BC 07/14/2021, 4:53 PM

## 2021-07-18 ENCOUNTER — Inpatient Hospital Stay: Payer: Medicaid Other

## 2021-07-18 ENCOUNTER — Other Ambulatory Visit: Payer: Self-pay

## 2021-07-18 DIAGNOSIS — Z95828 Presence of other vascular implants and grafts: Secondary | ICD-10-CM

## 2021-07-18 DIAGNOSIS — C3492 Malignant neoplasm of unspecified part of left bronchus or lung: Secondary | ICD-10-CM

## 2021-07-18 DIAGNOSIS — Z5111 Encounter for antineoplastic chemotherapy: Secondary | ICD-10-CM | POA: Diagnosis not present

## 2021-07-18 LAB — CBC WITH DIFFERENTIAL (CANCER CENTER ONLY)
Abs Immature Granulocytes: 0.13 10*3/uL — ABNORMAL HIGH (ref 0.00–0.07)
Basophils Absolute: 0.1 10*3/uL (ref 0.0–0.1)
Basophils Relative: 2 %
Eosinophils Absolute: 0.1 10*3/uL (ref 0.0–0.5)
Eosinophils Relative: 2 %
HCT: 33.9 % — ABNORMAL LOW (ref 36.0–46.0)
Hemoglobin: 11.9 g/dL — ABNORMAL LOW (ref 12.0–15.0)
Immature Granulocytes: 3 %
Lymphocytes Relative: 19 %
Lymphs Abs: 0.9 10*3/uL (ref 0.7–4.0)
MCH: 33.2 pg (ref 26.0–34.0)
MCHC: 35.1 g/dL (ref 30.0–36.0)
MCV: 94.7 fL (ref 80.0–100.0)
Monocytes Absolute: 0.1 10*3/uL (ref 0.1–1.0)
Monocytes Relative: 2 %
Neutro Abs: 3.3 10*3/uL (ref 1.7–7.7)
Neutrophils Relative %: 72 %
Platelet Count: 154 10*3/uL (ref 150–400)
RBC: 3.58 MIL/uL — ABNORMAL LOW (ref 3.87–5.11)
RDW: 13.7 % (ref 11.5–15.5)
WBC Count: 4.5 10*3/uL (ref 4.0–10.5)
nRBC: 0 % (ref 0.0–0.2)

## 2021-07-18 LAB — CMP (CANCER CENTER ONLY)
ALT: 20 U/L (ref 0–44)
AST: 17 U/L (ref 15–41)
Albumin: 3.6 g/dL (ref 3.5–5.0)
Alkaline Phosphatase: 49 U/L (ref 38–126)
Anion gap: 6 (ref 5–15)
BUN: 14 mg/dL (ref 6–20)
CO2: 29 mmol/L (ref 22–32)
Calcium: 9.2 mg/dL (ref 8.9–10.3)
Chloride: 99 mmol/L (ref 98–111)
Creatinine: 0.79 mg/dL (ref 0.44–1.00)
GFR, Estimated: >60 mL/min (ref >60)
Glucose, Bld: 164 mg/dL — ABNORMAL HIGH (ref 70–99)
Potassium: 3.7 mmol/L (ref 3.5–5.1)
Sodium: 134 mmol/L — ABNORMAL LOW (ref 135–145)
Total Bilirubin: 0.6 mg/dL (ref 0.3–1.2)
Total Protein: 6.7 g/dL (ref 6.5–8.1)

## 2021-07-18 LAB — TSH: TSH: 0.621 u[IU]/mL (ref 0.350–4.500)

## 2021-07-18 MED ORDER — SODIUM CHLORIDE 0.9% FLUSH
10.0000 mL | Freq: Once | INTRAVENOUS | Status: AC
  Administered 2021-07-18: 10 mL

## 2021-07-18 MED ORDER — HEPARIN SOD (PORK) LOCK FLUSH 100 UNIT/ML IV SOLN
500.0000 [IU] | Freq: Once | INTRAVENOUS | Status: AC
  Administered 2021-07-18: 500 [IU]

## 2021-07-25 ENCOUNTER — Inpatient Hospital Stay: Payer: Medicaid Other

## 2021-07-25 DIAGNOSIS — Z95828 Presence of other vascular implants and grafts: Secondary | ICD-10-CM

## 2021-07-25 DIAGNOSIS — C3492 Malignant neoplasm of unspecified part of left bronchus or lung: Secondary | ICD-10-CM

## 2021-07-25 DIAGNOSIS — Z5111 Encounter for antineoplastic chemotherapy: Secondary | ICD-10-CM | POA: Diagnosis not present

## 2021-07-25 LAB — CBC WITH DIFFERENTIAL (CANCER CENTER ONLY)
Abs Immature Granulocytes: 0 10*3/uL (ref 0.00–0.07)
Basophils Absolute: 0.1 10*3/uL (ref 0.0–0.1)
Basophils Relative: 2 %
Eosinophils Absolute: 0.1 10*3/uL (ref 0.0–0.5)
Eosinophils Relative: 4 %
HCT: 31.5 % — ABNORMAL LOW (ref 36.0–46.0)
Hemoglobin: 10.8 g/dL — ABNORMAL LOW (ref 12.0–15.0)
Immature Granulocytes: 0 %
Lymphocytes Relative: 50 %
Lymphs Abs: 1.1 10*3/uL (ref 0.7–4.0)
MCH: 33.5 pg (ref 26.0–34.0)
MCHC: 34.3 g/dL (ref 30.0–36.0)
MCV: 97.8 fL (ref 80.0–100.0)
Monocytes Absolute: 0.6 10*3/uL (ref 0.1–1.0)
Monocytes Relative: 24 %
Neutro Abs: 0.5 10*3/uL — ABNORMAL LOW (ref 1.7–7.7)
Neutrophils Relative %: 20 %
Platelet Count: 127 10*3/uL — ABNORMAL LOW (ref 150–400)
RBC: 3.22 MIL/uL — ABNORMAL LOW (ref 3.87–5.11)
RDW: 14.9 % (ref 11.5–15.5)
Smear Review: NORMAL
WBC Count: 2.2 10*3/uL — ABNORMAL LOW (ref 4.0–10.5)
nRBC: 0 % (ref 0.0–0.2)

## 2021-07-25 LAB — CMP (CANCER CENTER ONLY)
ALT: 11 U/L (ref 0–44)
AST: 12 U/L — ABNORMAL LOW (ref 15–41)
Albumin: 3.6 g/dL (ref 3.5–5.0)
Alkaline Phosphatase: 47 U/L (ref 38–126)
Anion gap: 7 (ref 5–15)
BUN: 10 mg/dL (ref 6–20)
CO2: 28 mmol/L (ref 22–32)
Calcium: 9.5 mg/dL (ref 8.9–10.3)
Chloride: 101 mmol/L (ref 98–111)
Creatinine: 0.76 mg/dL (ref 0.44–1.00)
GFR, Estimated: >60 mL/min (ref >60)
Glucose, Bld: 140 mg/dL — ABNORMAL HIGH (ref 70–99)
Potassium: 3.7 mmol/L (ref 3.5–5.1)
Sodium: 136 mmol/L (ref 135–145)
Total Bilirubin: 0.3 mg/dL (ref 0.3–1.2)
Total Protein: 6.6 g/dL (ref 6.5–8.1)

## 2021-07-25 MED ORDER — SODIUM CHLORIDE 0.9% FLUSH
10.0000 mL | Freq: Once | INTRAVENOUS | Status: AC
  Administered 2021-07-25: 10 mL

## 2021-07-25 MED ORDER — HEPARIN SOD (PORK) LOCK FLUSH 100 UNIT/ML IV SOLN
500.0000 [IU] | Freq: Once | INTRAVENOUS | Status: AC
  Administered 2021-07-25: 500 [IU]

## 2021-07-26 ENCOUNTER — Telehealth: Payer: Self-pay

## 2021-07-26 NOTE — Telephone Encounter (Signed)
Pt called to discuss increased fatigue after her third Chemo tx. Encouraged pt to rest when she feels tired. Discussed neutropenic precautions and need to wear mask in public. All questions answered.

## 2021-07-28 ENCOUNTER — Encounter: Payer: Self-pay | Admitting: *Deleted

## 2021-07-28 NOTE — Progress Notes (Signed)
I followed up on Patricia Pena's treatment plan's schedule and she is scheduled at this time.

## 2021-08-01 ENCOUNTER — Other Ambulatory Visit: Payer: Self-pay

## 2021-08-01 ENCOUNTER — Encounter: Payer: Self-pay | Admitting: Internal Medicine

## 2021-08-01 ENCOUNTER — Inpatient Hospital Stay: Payer: Medicaid Other

## 2021-08-01 ENCOUNTER — Other Ambulatory Visit (HOSPITAL_COMMUNITY): Payer: Self-pay

## 2021-08-01 ENCOUNTER — Inpatient Hospital Stay (HOSPITAL_BASED_OUTPATIENT_CLINIC_OR_DEPARTMENT_OTHER): Payer: Medicaid Other | Admitting: Internal Medicine

## 2021-08-01 VITALS — BP 92/58 | HR 94

## 2021-08-01 DIAGNOSIS — Z5112 Encounter for antineoplastic immunotherapy: Secondary | ICD-10-CM | POA: Diagnosis not present

## 2021-08-01 DIAGNOSIS — C3492 Malignant neoplasm of unspecified part of left bronchus or lung: Secondary | ICD-10-CM | POA: Diagnosis not present

## 2021-08-01 DIAGNOSIS — C3490 Malignant neoplasm of unspecified part of unspecified bronchus or lung: Secondary | ICD-10-CM

## 2021-08-01 DIAGNOSIS — E876 Hypokalemia: Secondary | ICD-10-CM | POA: Diagnosis not present

## 2021-08-01 DIAGNOSIS — Z5111 Encounter for antineoplastic chemotherapy: Secondary | ICD-10-CM

## 2021-08-01 DIAGNOSIS — Z95828 Presence of other vascular implants and grafts: Secondary | ICD-10-CM

## 2021-08-01 LAB — CBC WITH DIFFERENTIAL (CANCER CENTER ONLY)
Abs Immature Granulocytes: 0.07 10*3/uL (ref 0.00–0.07)
Basophils Absolute: 0.1 10*3/uL (ref 0.0–0.1)
Basophils Relative: 1 %
Eosinophils Absolute: 0.1 10*3/uL (ref 0.0–0.5)
Eosinophils Relative: 2 %
HCT: 33.2 % — ABNORMAL LOW (ref 36.0–46.0)
Hemoglobin: 11.6 g/dL — ABNORMAL LOW (ref 12.0–15.0)
Immature Granulocytes: 1 %
Lymphocytes Relative: 22 %
Lymphs Abs: 1.5 10*3/uL (ref 0.7–4.0)
MCH: 33.9 pg (ref 26.0–34.0)
MCHC: 34.9 g/dL (ref 30.0–36.0)
MCV: 97.1 fL (ref 80.0–100.0)
Monocytes Absolute: 0.8 10*3/uL (ref 0.1–1.0)
Monocytes Relative: 12 %
Neutro Abs: 4.2 10*3/uL (ref 1.7–7.7)
Neutrophils Relative %: 62 %
Platelet Count: 304 10*3/uL (ref 150–400)
RBC: 3.42 MIL/uL — ABNORMAL LOW (ref 3.87–5.11)
RDW: 15 % (ref 11.5–15.5)
WBC Count: 6.8 10*3/uL (ref 4.0–10.5)
nRBC: 0 % (ref 0.0–0.2)

## 2021-08-01 LAB — CMP (CANCER CENTER ONLY)
ALT: 9 U/L (ref 0–44)
AST: 12 U/L — ABNORMAL LOW (ref 15–41)
Albumin: 3.5 g/dL (ref 3.5–5.0)
Alkaline Phosphatase: 46 U/L (ref 38–126)
Anion gap: 9 (ref 5–15)
BUN: 9 mg/dL (ref 6–20)
CO2: 27 mmol/L (ref 22–32)
Calcium: 9.2 mg/dL (ref 8.9–10.3)
Chloride: 100 mmol/L (ref 98–111)
Creatinine: 0.87 mg/dL (ref 0.44–1.00)
GFR, Estimated: >60 mL/min (ref >60)
Glucose, Bld: 127 mg/dL — ABNORMAL HIGH (ref 70–99)
Potassium: 3.1 mmol/L — ABNORMAL LOW (ref 3.5–5.1)
Sodium: 136 mmol/L (ref 135–145)
Total Bilirubin: 0.4 mg/dL (ref 0.3–1.2)
Total Protein: 6.5 g/dL (ref 6.5–8.1)

## 2021-08-01 MED ORDER — SODIUM CHLORIDE 0.9 % IV SOLN
100.0000 mg/m2 | Freq: Once | INTRAVENOUS | Status: AC
  Administered 2021-08-01: 240 mg via INTRAVENOUS
  Filled 2021-08-01: qty 12

## 2021-08-01 MED ORDER — SODIUM CHLORIDE 0.9 % IV SOLN
10.0000 mg | Freq: Once | INTRAVENOUS | Status: AC
  Administered 2021-08-01: 10 mg via INTRAVENOUS
  Filled 2021-08-01: qty 10

## 2021-08-01 MED ORDER — HEPARIN SOD (PORK) LOCK FLUSH 100 UNIT/ML IV SOLN
500.0000 [IU] | Freq: Once | INTRAVENOUS | Status: AC | PRN
  Administered 2021-08-01: 500 [IU]

## 2021-08-01 MED ORDER — SODIUM CHLORIDE 0.9% FLUSH
10.0000 mL | Freq: Once | INTRAVENOUS | Status: AC
  Administered 2021-08-01: 10 mL

## 2021-08-01 MED ORDER — SODIUM CHLORIDE 0.9% FLUSH
10.0000 mL | INTRAVENOUS | Status: DC | PRN
  Administered 2021-08-01: 10 mL

## 2021-08-01 MED ORDER — SODIUM CHLORIDE 0.9 % IV SOLN
750.0000 mg | Freq: Once | INTRAVENOUS | Status: AC
  Administered 2021-08-01: 750 mg via INTRAVENOUS
  Filled 2021-08-01: qty 75

## 2021-08-01 MED ORDER — SODIUM CHLORIDE 0.9 % IV SOLN: Freq: Once | INTRAVENOUS | Status: AC

## 2021-08-01 MED ORDER — POTASSIUM CHLORIDE CRYS ER 20 MEQ PO TBCR
20.0000 meq | EXTENDED_RELEASE_TABLET | Freq: Every day | ORAL | 0 refills | Status: DC
  Filled 2021-08-01: qty 6, 6d supply, fill #0

## 2021-08-01 MED ORDER — PALONOSETRON HCL INJECTION 0.25 MG/5ML
0.2500 mg | Freq: Once | INTRAVENOUS | Status: AC
  Administered 2021-08-01: 0.25 mg via INTRAVENOUS
  Filled 2021-08-01: qty 5

## 2021-08-01 MED ORDER — TRILACICLIB DIHYDROCHLORIDE INJECTION 300 MG
240.0000 mg/m2 | Freq: Once | INTRAVENOUS | Status: AC
  Administered 2021-08-01: 600 mg via INTRAVENOUS
  Filled 2021-08-01: qty 40

## 2021-08-01 MED ORDER — SODIUM CHLORIDE 0.9 % IV SOLN
150.0000 mg | Freq: Once | INTRAVENOUS | Status: AC
  Administered 2021-08-01: 150 mg via INTRAVENOUS
  Filled 2021-08-01: qty 150

## 2021-08-01 MED ORDER — SODIUM CHLORIDE 0.9 % IV SOLN
1500.0000 mg | Freq: Once | INTRAVENOUS | Status: AC
  Administered 2021-08-01: 1500 mg via INTRAVENOUS
  Filled 2021-08-01: qty 30

## 2021-08-02 ENCOUNTER — Other Ambulatory Visit: Payer: Self-pay | Admitting: Physician Assistant

## 2021-08-02 ENCOUNTER — Inpatient Hospital Stay: Payer: Medicaid Other

## 2021-08-02 ENCOUNTER — Encounter: Payer: Self-pay | Admitting: Licensed Clinical Social Worker

## 2021-08-02 VITALS — BP 102/60 | HR 99 | Temp 98.4°F | Resp 20

## 2021-08-02 DIAGNOSIS — I959 Hypotension, unspecified: Secondary | ICD-10-CM

## 2021-08-02 DIAGNOSIS — C3492 Malignant neoplasm of unspecified part of left bronchus or lung: Secondary | ICD-10-CM

## 2021-08-02 DIAGNOSIS — Z5111 Encounter for antineoplastic chemotherapy: Secondary | ICD-10-CM | POA: Diagnosis not present

## 2021-08-02 MED ORDER — SODIUM CHLORIDE 0.9 % IV SOLN
10.0000 mg | Freq: Once | INTRAVENOUS | Status: AC
  Administered 2021-08-02: 10 mg via INTRAVENOUS
  Filled 2021-08-02: qty 10

## 2021-08-02 MED ORDER — SODIUM CHLORIDE 0.9 % IV SOLN: Freq: Once | INTRAVENOUS | Status: AC

## 2021-08-02 MED ORDER — HEPARIN SOD (PORK) LOCK FLUSH 100 UNIT/ML IV SOLN
500.0000 [IU] | Freq: Once | INTRAVENOUS | Status: AC | PRN
  Administered 2021-08-02: 500 [IU]

## 2021-08-02 MED ORDER — SODIUM CHLORIDE 0.9 % IV SOLN
100.0000 mg/m2 | Freq: Once | INTRAVENOUS | Status: AC
  Administered 2021-08-02: 240 mg via INTRAVENOUS
  Filled 2021-08-02: qty 12

## 2021-08-02 MED ORDER — TRILACICLIB DIHYDROCHLORIDE INJECTION 300 MG
240.0000 mg/m2 | Freq: Once | INTRAVENOUS | Status: AC
  Administered 2021-08-02: 600 mg via INTRAVENOUS
  Filled 2021-08-02: qty 40

## 2021-08-02 MED ORDER — SODIUM CHLORIDE 0.9% FLUSH
10.0000 mL | INTRAVENOUS | Status: DC | PRN
  Administered 2021-08-02: 10 mL

## 2021-08-02 MED FILL — Dexamethasone Sodium Phosphate Inj 100 MG/10ML: INTRAMUSCULAR | Qty: 1 | Status: AC

## 2021-08-03 ENCOUNTER — Other Ambulatory Visit: Payer: Self-pay

## 2021-08-03 ENCOUNTER — Other Ambulatory Visit: Payer: Self-pay | Admitting: Internal Medicine

## 2021-08-03 ENCOUNTER — Inpatient Hospital Stay: Payer: Medicaid Other

## 2021-08-03 VITALS — BP 116/67 | HR 83 | Temp 97.9°F | Resp 20 | Wt 275.1 lb

## 2021-08-03 DIAGNOSIS — Z5111 Encounter for antineoplastic chemotherapy: Secondary | ICD-10-CM | POA: Diagnosis not present

## 2021-08-03 DIAGNOSIS — C3492 Malignant neoplasm of unspecified part of left bronchus or lung: Secondary | ICD-10-CM

## 2021-08-03 MED ORDER — SODIUM CHLORIDE 0.9 % IV SOLN: Freq: Once | INTRAVENOUS | Status: AC

## 2021-08-03 MED ORDER — SODIUM CHLORIDE 0.9 % IV SOLN
100.0000 mg/m2 | Freq: Once | INTRAVENOUS | Status: AC
  Administered 2021-08-03: 240 mg via INTRAVENOUS
  Filled 2021-08-03: qty 12

## 2021-08-03 MED ORDER — SODIUM CHLORIDE 0.9% FLUSH
10.0000 mL | INTRAVENOUS | Status: DC | PRN
  Administered 2021-08-03: 10 mL

## 2021-08-03 MED ORDER — TRILACICLIB DIHYDROCHLORIDE INJECTION 300 MG
240.0000 mg/m2 | Freq: Once | INTRAVENOUS | Status: AC
  Administered 2021-08-03: 600 mg via INTRAVENOUS
  Filled 2021-08-03: qty 40

## 2021-08-03 MED ORDER — HEPARIN SOD (PORK) LOCK FLUSH 100 UNIT/ML IV SOLN
500.0000 [IU] | Freq: Once | INTRAVENOUS | Status: AC | PRN
  Administered 2021-08-03: 500 [IU]

## 2021-08-03 MED ORDER — SODIUM CHLORIDE 0.9 % IV SOLN
10.0000 mg | Freq: Once | INTRAVENOUS | Status: AC
  Administered 2021-08-03: 10 mg via INTRAVENOUS
  Filled 2021-08-03: qty 10

## 2021-08-03 NOTE — Patient Instructions (Signed)
[facility] ONCOLOG[facility]  Discharge Instructions: Thank you for choosing [facility] to provide your oncology and hematology care.   If you have a lab appointment with the [facility], please go directly to the [facility] and check in at the registration area.   Wear comfortable clothing and clothing appropriate for easy access to any Portacath or PICC line.   We strive to give you quality time with your provider. You may need to reschedule your appointment if you arrive late (15 or more minutes).  Arriving late affects you and other patients whose appointments are after yours.  Also, if you miss three or more appointments without notifying the office, you may be dismissed from the clinic at the provider's discretion.      For prescription refill requests, have your pharmacy contact our office and allow 72 hours for refills to be completed.    Today you received the following chemotherapy and/or immunotherapy agents: Etoposide, Cosela.       To help prevent nausea and vomiting after your treatment, we encourage you to take your nausea medication as directed.  BELOW ARE SYMPTOMS THAT SHOULD BE REPORTED IMMEDIATELY: *FEVER GREATER THAN 100.4 F (38 C) OR HIGHER *CHILLS OR SWEATING *NAUSEA AND VOMITING THAT IS NOT CONTROLLED WITH YOUR NAUSEA MEDICATION *UNUSUAL SHORTNESS OF BREATH *UNUSUAL BRUISING OR BLEEDING *URINARY PROBLEMS (pain or burning when urinating, or frequent urination) *BOWEL PROBLEMS (unusual diarrhea, constipation, pain near the anus) TENDERNESS IN MOUTH AND THROAT WITH OR WITHOUT PRESENCE OF ULCERS (sore throat, sores in mouth, or a toothache) UNUSUAL RASH, SWELLING OR PAIN  UNUSUAL VAGINAL DISCHARGE OR ITCHING   Items with * indicate a potential emergency and should be followed up as soon as possible or go to the Emergency Department if any problems should occur.  Please show the CHEMOTHERAPY ALERT CARD or IMMUNOTHERAPY ALERT CARD at  check-in to the Emergency Department and triage nurse.  Should you have questions after your visit or need to cancel or reschedule your appointment, please contact [facility]  Dept: [phone]  and follow the prompts.  Office hours are 8:00 a.m. to 4:30 p.m. Monday - Friday. Please note that voicemails left after 4:00 p.m. may not be returned until the following business day.  We are closed weekends and major holidays. You have access to a nurse at all times for urgent questions. Please call the main number to the clinic Dept: [phone] and follow the prompts.   For any non-urgent questions, you may also contact your provider using MyChart. We now offer e-Visits for anyone 20 and older to request care online for non-urgent symptoms. For details visit [email or web address].   Also download the MyChart app! Go to the app store, search "MyChart", open the app, select [facility], and log in with your MyChart username and password.  Masks are optional in the cancer centers. If you would like for your care team to wear a mask while they are taking care of you, please let them know. For doctor visits, patients may have with them one support person who is at least 58 years old. At this time, visitors are not allowed in the infusion area.

## 2021-08-04 ENCOUNTER — Telehealth: Payer: Self-pay | Admitting: Medical Oncology

## 2021-08-04 ENCOUNTER — Emergency Department (HOSPITAL_COMMUNITY): Payer: Medicaid Other

## 2021-08-04 ENCOUNTER — Other Ambulatory Visit: Payer: Self-pay

## 2021-08-04 ENCOUNTER — Encounter (HOSPITAL_COMMUNITY): Payer: Self-pay | Admitting: Emergency Medicine

## 2021-08-04 ENCOUNTER — Emergency Department (HOSPITAL_COMMUNITY)
Admission: EM | Admit: 2021-08-04 | Discharge: 2021-08-04 | Disposition: A | Discharge status: Home / Self Care | Payer: Medicaid Other | Attending: Emergency Medicine | Admitting: Emergency Medicine

## 2021-08-04 DIAGNOSIS — E876 Hypokalemia: Secondary | ICD-10-CM | POA: Insufficient documentation

## 2021-08-04 DIAGNOSIS — Z79899 Other long term (current) drug therapy: Secondary | ICD-10-CM | POA: Insufficient documentation

## 2021-08-04 DIAGNOSIS — I10 Essential (primary) hypertension: Secondary | ICD-10-CM | POA: Diagnosis not present

## 2021-08-04 DIAGNOSIS — Z85118 Personal history of other malignant neoplasm of bronchus and lung: Secondary | ICD-10-CM | POA: Diagnosis not present

## 2021-08-04 DIAGNOSIS — J90 Pleural effusion, not elsewhere classified: Secondary | ICD-10-CM | POA: Diagnosis not present

## 2021-08-04 DIAGNOSIS — R000 Tachycardia, unspecified: Secondary | ICD-10-CM | POA: Insufficient documentation

## 2021-08-04 DIAGNOSIS — R0602 Shortness of breath: Secondary | ICD-10-CM | POA: Diagnosis not present

## 2021-08-04 DIAGNOSIS — J811 Chronic pulmonary edema: Secondary | ICD-10-CM | POA: Diagnosis not present

## 2021-08-04 HISTORY — DX: Essential (primary) hypertension: I10

## 2021-08-04 HISTORY — DX: Malignant (primary) neoplasm, unspecified: C80.1

## 2021-08-04 LAB — CBC WITH DIFFERENTIAL/PLATELET
Abs Immature Granulocytes: 0.04 10*3/uL (ref 0.00–0.07)
Basophils Absolute: 0 10*3/uL (ref 0.0–0.1)
Basophils Relative: 0 %
Eosinophils Absolute: 0.2 10*3/uL (ref 0.0–0.5)
Eosinophils Relative: 3 %
HCT: 30.4 % — ABNORMAL LOW (ref 36.0–46.0)
Hemoglobin: 10.2 g/dL — ABNORMAL LOW (ref 12.0–15.0)
Immature Granulocytes: 1 %
Lymphocytes Relative: 7 %
Lymphs Abs: 0.4 10*3/uL — ABNORMAL LOW (ref 0.7–4.0)
MCH: 33.7 pg (ref 26.0–34.0)
MCHC: 33.6 g/dL (ref 30.0–36.0)
MCV: 100.3 fL — ABNORMAL HIGH (ref 80.0–100.0)
Monocytes Absolute: 0.1 10*3/uL (ref 0.1–1.0)
Monocytes Relative: 2 %
Neutro Abs: 5.9 10*3/uL (ref 1.7–7.7)
Neutrophils Relative %: 87 %
Platelets: 191 10*3/uL (ref 150–400)
RBC: 3.03 MIL/uL — ABNORMAL LOW (ref 3.87–5.11)
RDW: 14.9 % (ref 11.5–15.5)
WBC: 6.7 10*3/uL (ref 4.0–10.5)
nRBC: 0 % (ref 0.0–0.2)

## 2021-08-04 LAB — TROPONIN I (HIGH SENSITIVITY)
Troponin I (High Sensitivity): 5 ng/L (ref <18)
Troponin I (High Sensitivity): 6 ng/L (ref <18)

## 2021-08-04 LAB — COMPREHENSIVE METABOLIC PANEL
ALT: 29 U/L (ref 0–44)
AST: 45 U/L — ABNORMAL HIGH (ref 15–41)
Albumin: 3 g/dL — ABNORMAL LOW (ref 3.5–5.0)
Alkaline Phosphatase: 43 U/L (ref 38–126)
Anion gap: 5 (ref 5–15)
BUN: 14 mg/dL (ref 6–20)
CO2: 28 mmol/L (ref 22–32)
Calcium: 8.3 mg/dL — ABNORMAL LOW (ref 8.9–10.3)
Chloride: 104 mmol/L (ref 98–111)
Creatinine, Ser: 0.71 mg/dL (ref 0.44–1.00)
GFR, Estimated: >60 mL/min (ref >60)
Glucose, Bld: 102 mg/dL — ABNORMAL HIGH (ref 70–99)
Potassium: 3.2 mmol/L — ABNORMAL LOW (ref 3.5–5.1)
Sodium: 137 mmol/L (ref 135–145)
Total Bilirubin: 0.5 mg/dL (ref 0.3–1.2)
Total Protein: 6 g/dL — ABNORMAL LOW (ref 6.5–8.1)

## 2021-08-04 LAB — BRAIN NATRIURETIC PEPTIDE: B Natriuretic Peptide: 130.5 pg/mL — ABNORMAL HIGH (ref 0.0–100.0)

## 2021-08-04 MED ORDER — HEPARIN SOD (PORK) LOCK FLUSH 100 UNIT/ML IV SOLN
INTRAVENOUS | Status: AC
  Filled 2021-08-04: qty 5

## 2021-08-04 MED ORDER — FUROSEMIDE 10 MG/ML IJ SOLN
20.0000 mg | Freq: Once | INTRAMUSCULAR | Status: AC
  Administered 2021-08-04: 20 mg via INTRAVENOUS
  Filled 2021-08-04: qty 4

## 2021-08-04 NOTE — Discharge Instructions (Signed)
The pleural effusion today on the left is only half of what it was at the beginning of the month.  Continue to check your weight daily and follow low-sodium diet.  If you start having severe shortness of breath even at rest, severe chest pain, fever or other concerns return to the emergency room immediately.

## 2021-08-04 NOTE — ED Provider Notes (Signed)
[facility] DEPT Provider Note   CSN: [id number] Arrival date & time: 08/04/21  1556     History  Chief Complaint  Patient presents with   Shortness of Breath   Chest Pain    Patricia Pena is a 56 y.o. female.  Patient is a 56 year old female with a history of seizures, hypertension, lung cancer currently on chemotherapy with recurrent pleural effusions requiring thoracentesis who is presenting today with complaint of swelling and shortness of breath.  Patient also reports she has had some chest tightness and a little bit of back pain this week.  She reports that she saw Dr. Earlie Server last week and her blood pressure was low at that time and they discontinued her lisinopril.  She reports since Monday she had gained 11 pounds but did lose 3 pounds today.  She started taking the Lasix 2 days ago which she had been taking as needed based on her weight which she checks daily.  The last 2 days also she had woken up in the morning with some tightness in her chest.  She has had an intermittent cough which is at times productive but not consistently.  She has had no fevers.  Her last chemoinfusion was yesterday and she has been tolerating those well.  She has been on that specific medication for the last 2 months.  She denies any orthopnea but does have exertional dyspnea.  She has noted some minimal swelling in her legs but denies any abdominal distention.  No nausea vomiting or diarrhea.  She denies any abdominal pain.  She is supposed to see her doctor tomorrow and get on a more regimented fluid medication but with her elevated heart rate and shortness of breath today they recommend she come to the emergency room.  The history is provided by the patient and medical records.  Shortness of Breath Associated symptoms: chest pain   Chest Pain Associated symptoms: shortness of breath        Home Medications Prior to Admission medications   Medication Sig Start Date  End Date Taking? Authorizing Provider  CALCIUM-VITAMIN D PO Take 2,000 mg by mouth daily. Patient not taking: Reported on 07/11/2021    [provider]  cholecalciferol (VITAMIN D) 1000 units tablet Take 1,000 Units by mouth daily.    [provider]  Cyanocobalamin (VITAMIN B-12 PO) Take 1 tablet by mouth daily. Patient not taking: Reported on 06/20/2021    [provider]  Desoximetasone 0.05 % OINT Apply 1 application. topically daily as needed (For skin rash). 04/07/17   [provider]  furosemide (LASIX) 20 MG tablet Take 1 tablet (20 mg total) by mouth daily as needed (weight gain of 3lbs in 1 day or 5lbs in 1 week). 05/15/21 05/15/22  Lavina Hamman, MD  hydrOXYzine (ATARAX) 10 MG tablet Take 1 tablet (10 mg total) by mouth 3 (three) times daily as needed. 05/20/21   [name], NP  lidocaine-prilocaine (EMLA) cream Apply to the Port-A-Cath site 30 minutes before chemotherapy Patient not taking: Reported on 06/20/2021 05/24/21   Curt Bears, MD  lisinopril-hydrochlorothiazide (PRINZIDE,ZESTORETIC) 20-25 MG tablet TAKE 1 [place] DAY Patient taking differently: Take 1 tablet by mouth daily. 04/24/18   Daleen Squibb, MD  loperamide (IMODIUM) 2 MG capsule Take 2 mg by mouth as needed for diarrhea or loose stools. Take 2 capsules  after first diarrhea stool, then take one capsule after each subsequent stool up to 6-8/day  [provider]  LORazepam (ATIVAN) 1 MG tablet 1 tablet 30 minutes before MRI of the brain. Patient not taking: Reported on 06/20/2021 06/13/21   Curt Bears, MD  ondansetron (ZOFRAN) 8 MG tablet Take 1 tablet (8 mg total) by mouth every 8 (eight) hours as needed for nausea or vomiting. 07/11/21   Heilingoetter, Cassandra L, PA-C  potassium chloride SA (KLOR-CON M) 20 MEQ tablet Take 1 tablet by mouth daily. 08/01/21   Curt Bears, MD  prochlorperazine (COMPAZINE) 10 MG tablet Take 1 tablet (10 mg total)  by mouth every 6 (six) hours as needed. 07/11/21   Heilingoetter, Cassandra L, PA-C      Allergies    Codeine    Review of Systems   Review of Systems  Respiratory:  Positive for shortness of breath.   Cardiovascular:  Positive for chest pain.    Physical Exam Updated Vital Signs BP 109/69   Pulse (!) 102   Temp 98.7 F (37.1 C) (Oral)   Resp 15   Ht 5\' 3"  (1.6 m)   Wt 124.7 kg   SpO2 96%   BMI 48.71 kg/m  Physical Exam Vitals and nursing note reviewed.  Constitutional:      General: She is not in acute distress.    Appearance: She is well-developed.  HENT:     Head: Normocephalic and atraumatic.  Eyes:     Pupils: Pupils are equal, round, and reactive to light.  Cardiovascular:     Rate and Rhythm: Regular rhythm. Tachycardia present.     Heart sounds: Normal heart sounds. No murmur heard.    No friction rub.  Pulmonary:     Effort: Pulmonary effort is normal.     Breath sounds: Examination of the right-lower field reveals rales. Examination of the left-lower field reveals rales. Rales present. No wheezing.  Abdominal:     General: Bowel sounds are normal. There is no distension.     Palpations: Abdomen is soft.     Tenderness: There is no abdominal tenderness. There is no guarding or rebound.  Musculoskeletal:        General: No tenderness. Normal range of motion.     Right lower leg: No edema.     Left lower leg: No edema.     Comments: No edema  Skin:    General: Skin is warm and dry.     Findings: No rash.  Neurological:     Mental Status: She is alert and oriented to person, place, and time. Mental status is at baseline.     Cranial Nerves: No cranial nerve deficit.  Psychiatric:        Behavior: Behavior normal.     ED Results / Procedures / Treatments   Labs (all labs ordered are listed, but only abnormal results are displayed) Labs Reviewed  CBC WITH DIFFERENTIAL/PLATELET - Abnormal; Notable for the following components:      Result Value   RBC  3.03 (*)    Hemoglobin 10.2 (*)    HCT 30.4 (*)    MCV 100.3 (*)    All other components within normal limits  BRAIN NATRIURETIC PEPTIDE - Abnormal; Notable for the following components:   B Natriuretic Peptide 130.5 (*)    All other components within normal limits  COMPREHENSIVE METABOLIC PANEL - Abnormal; Notable for the following components:   Potassium 3.2 (*)    Glucose, Bld 102 (*)    Calcium 8.3 (*)    Total Protein 6.0 (*)  Albumin 3.0 (*)    AST 45 (*)    All other components within normal limits  TROPONIN I (HIGH SENSITIVITY)  TROPONIN I (HIGH SENSITIVITY)    EKG EKG Interpretation  Date/Time:  Thursday August 04 2021 16:07:13 EDT Ventricular Rate:  122 PR Interval:  138 QRS Duration: 88 QT Interval:  353 QTC Calculation: 493 R Axis:   39 Text Interpretation: Sinus tachycardia Multiform ventricular premature complexes Low voltage, precordial leads Borderline repolarization abnormality Borderline prolonged QT interval No significant change since last tracing Confirmed by Blanchie Dessert ([id number]) on 08/04/2021 6:37:26 PM  Radiology DG Chest Port 1 View  Result Date: 08/04/2021 CLINICAL DATA:  Shortness of breath. EXAM: PORTABLE CHEST 1 VIEW COMPARISON:  July 14, 2021 FINDINGS: Injectable port in stable position. The cardiac silhouette is obscured by left pleural effusion which is radiographically increased from July 14, 2021. Mild interstitial pulmonary edema. IMPRESSION: 1. Enlarging left pleural effusion. 2. Mild interstitial pulmonary edema. Electronically Signed   By: Fidela Salisbury M.D.   On: 08/04/2021 17:11    Procedures Procedures    Medications Ordered in ED Medications  furosemide (LASIX) injection 20 mg (20 mg Intravenous Given 08/04/21 1924)    ED Course/ Medical Decision Making/ A&P                           Medical Decision Making Amount and/or Complexity of Data Reviewed External Data Reviewed: notes.    Details: Oncology notes Labs:  ordered. Decision-making details documented in ED Course. Radiology: ordered and independent interpretation performed. Decision-making details documented in ED Course.  Risk Prescription drug management.   Pt with multiple medical problems and comorbidities and presenting today with a complaint that caries a high risk for morbidity and mortality.  Presenting today with complaint of weight gain, shortness of breath and intermittent chest discomfort.  Concern for CHF versus pericardial effusion versus recurrent pleural effusion versus acute kidney injury versus PE.  Lower suspicion for pneumonia.  Patient has been afebrile.  Blood pressure is normal here but patient is mildly tachypneic and initially was tachycardic which improved at rest.  She does not have excessive swelling in her legs and denies it in her abdomen but has been following her weights and is up 7 pounds.  She has taken diuretics today and has lost 3 pounds.  8:16 PM I independently interpreted patient's EKG and labs.  EKG shows a sinus tachycardia which is unchanged from April.  Troponin x2 is negative, CBC with stable hemoglobin white count, BNP mildly elevated at 130 and CMP with normal renal function and BUN.  Minimal hypokalemia of 3.2. I have independently visualized and interpreted pt's images today.  Chest x-ray today which does show a left-sided pleural effusion however based on her image at the beginning of this month when she required thoracentesis it is only half of that.  She also has some mild interstitial pulmonary edema.  Patient was given 20 mg of IV Lasix and has had significant diuresis here.  She was able to stand and walk and sats did not drop below 91% on room air.  She reports not feeling any more short of breath than normal.  On external medical chart review patient has had CTAs in the past which were negative for PE and it is always been the left-sided pleural effusion.  Low suspicion for PE at this time.  Patient has  close follow-up with her doctor tomorrow otherwise reassuring labs  and exam.  Feel that it is reasonable to allow patient to be discharged home to follow-up with her doctor tomorrow.  She is comfortable with this plan.  She was encouraged to return if symptoms worsen.  She has an x-ray scheduled for the next 2 weeks to reevaluate the pleural effusion.          Final Clinical Impression(s) / ED Diagnoses Final diagnoses:  Pleural effusion on left    Rx / DC Orders ED Discharge Orders     None         Blanchie Dessert, MD 08/04/21 2016

## 2021-08-04 NOTE — ED Triage Notes (Signed)
Patient reports shortness of breath, weight gain and chest pain x 3 days. Patient states recently taken off of lisinopril for her chemo and since then has put on 11 pounds. Was told if she gained weight to take fluid pill. Report chemo yesterday. Reports mid chest discomfort that is worse in the am.

## 2021-08-04 NOTE — Telephone Encounter (Signed)
Chest discomfort-She is anxious over the phone and describes she is having chest pressure. She sounds SOB.  Her last thoracentesis was 3 weeks ago.  She reports she  has gained 9 lbs in a few day. She took her lasix this morning and "lost 3  Lbs" since taking it .   I instructed her to call 911 . She said she cannot afford EMS so she will drive herself or have someone drive her.

## 2021-08-04 NOTE — ED Notes (Signed)
Ambulated pt in halls on RA. Pt's O2 ranged from 91-93%, HR increased to 125-134, pt states that she didn't feel more short of breath than normal. Pt currently sitting in a chair on RA per pt request. VS HR 95, O2 94%, RR 20.

## 2021-08-05 ENCOUNTER — Encounter: Payer: Self-pay | Admitting: Nurse Practitioner

## 2021-08-05 ENCOUNTER — Other Ambulatory Visit (HOSPITAL_COMMUNITY): Payer: Self-pay

## 2021-08-05 ENCOUNTER — Encounter: Payer: Self-pay | Admitting: Internal Medicine

## 2021-08-05 ENCOUNTER — Ambulatory Visit (INDEPENDENT_AMBULATORY_CARE_PROVIDER_SITE_OTHER): Payer: Medicaid Other | Admitting: Nurse Practitioner

## 2021-08-05 VITALS — BP 113/84 | HR 104 | Temp 97.5°F | Ht 63.0 in | Wt 272.8 lb

## 2021-08-05 DIAGNOSIS — I10 Essential (primary) hypertension: Secondary | ICD-10-CM

## 2021-08-05 MED ORDER — HYDROCHLOROTHIAZIDE 12.5 MG PO CAPS
12.5000 mg | ORAL_CAPSULE | Freq: Every day | ORAL | 0 refills | Status: DC
  Filled 2021-08-05: qty 30, 30d supply, fill #0

## 2021-08-05 MED ORDER — FUROSEMIDE 20 MG PO TABS
20.0000 mg | ORAL_TABLET | Freq: Every day | ORAL | 0 refills | Status: DC | PRN
  Filled 2021-08-05: qty 10, 10d supply, fill #0

## 2021-08-05 NOTE — Assessment & Plan Note (Signed)
-   hydrochlorothiazide (MICROZIDE) 12.5 MG capsule; Take 1 capsule (12.5 mg total) by mouth daily.  Dispense: 30 capsule; Refill: 0  - continue lasix as needed   Follow up:  Follow up in 6 months

## 2021-08-05 NOTE — Progress Notes (Signed)
@Patient  ID: Patricia Pena, female    DOB: 1965/10/23, 56 y.o.   MRN: [id number]  Chief Complaint  Patient presents with   Follow-up    Pt is here for BP follow up medication. Pt was told by oncology to hold off on taking the BP medication.    Referring provider: [name], NP   HPI  Patient presents today for follow-up on hypertension.  She was seen by oncology this week and was told that she may need to come off of her blood pressure medicine.  She started taking blood pressure medicines on Monday but started gaining fluid because she did not have the HCTZ.  We discussed that we will continue her on a half dose of HCTZ and she does have Lasix to take as needed with weight gain.  We will discontinue her lisinopril. Denies f/c/s, n/v/d, hemoptysis, PND, leg swelling Denies chest pain or edema     Allergies  Allergen Reactions   Codeine Other (See Comments)    headache    Immunization History  Administered Date(s) Administered   Hepatitis B 03/11/2013   Hepatitis B, adult 03/11/2013   Moderna Sars-Covid-2 Vaccination 12/17/2019   PFIZER Comirnaty(Gray Top)Covid-19 Tri-Sucrose Vaccine 07/03/2020   PFIZER(Purple Top)SARS-COV-2 Vaccination 04/16/2019, 05/07/2019   PPD Test 03/09/2013, 03/10/2013   Tdap 04/03/2011    Past Medical History:  Diagnosis Date   Arthritis    Cancer ([place])    Hypertension    Incisional hernia without mention of obstruction or gangrene    Morbid obesity ([place])    Seizures ([place])    last one was in 1985    Tobacco History: Social History   Tobacco Use  Smoking Status Former   Packs/day: 1.50   Years: 35.00   Total pack years: 52.50   Types: Cigarettes   Quit date: 12/28/2009   Years since quitting: 11.6  Smokeless Tobacco Never   Counseling given: Not Answered   Outpatient Encounter Medications as of 08/05/2021  Medication Sig   CALCIUM-VITAMIN D PO Take 2,000 mg by mouth daily.   cholecalciferol (VITAMIN D) 1000 units tablet  Take 1,000 Units by mouth daily.   Desoximetasone 0.05 % OINT Apply 1 application. topically daily as needed (For skin rash).   hydrochlorothiazide (MICROZIDE) 12.5 MG capsule Take 1 capsule (12.5 mg total) by mouth daily.   hydrOXYzine (ATARAX) 10 MG tablet Take 1 tablet (10 mg total) by mouth 3 (three) times daily as needed.   loperamide (IMODIUM) 2 MG capsule Take 2 mg by mouth as needed for diarrhea or loose stools. Take 2 capsules  after first diarrhea stool, then take one capsule after each subsequent stool up to 6-8/day   ondansetron (ZOFRAN) 8 MG tablet Take 1 tablet (8 mg total) by mouth every 8 (eight) hours as needed for nausea or vomiting.   potassium chloride SA (KLOR-CON M) 20 MEQ tablet Take 1 tablet by mouth daily.   prochlorperazine (COMPAZINE) 10 MG tablet Take 1 tablet (10 mg total) by mouth every 6 (six) hours as needed.   [DISCONTINUED] furosemide (LASIX) 20 MG tablet Take 1 tablet (20 mg total) by mouth daily as needed (weight gain of 3lbs in 1 day or 5lbs in 1 week).   Cyanocobalamin (VITAMIN B-12 PO) Take 1 tablet by mouth daily. (Patient not taking: Reported on 06/20/2021)   furosemide (LASIX) 20 MG tablet Take 1 tablet (20 mg total) by mouth daily as needed (weight gain of 3lbs in 1 day or 5lbs in 1  week).   lidocaine-prilocaine (EMLA) cream Apply to the Port-A-Cath site 30 minutes before chemotherapy (Patient not taking: Reported on 06/20/2021)   LORazepam (ATIVAN) 1 MG tablet 1 tablet 30 minutes before MRI of the brain. (Patient not taking: Reported on 06/20/2021)   [DISCONTINUED] lisinopril-hydrochlorothiazide (PRINZIDE,ZESTORETIC) 20-25 MG tablet TAKE 1 TABLET BY MOUTH EVERY DAY (Patient taking differently: Take 1 tablet by mouth daily.)   No facility-administered encounter medications on file as of 08/05/2021.     Review of Systems  Review of Systems  Constitutional: Negative.   HENT: Negative.    Cardiovascular: Negative.   Gastrointestinal: Negative.    Allergic/Immunologic: Negative.   Neurological: Negative.   Psychiatric/Behavioral: Negative.         Physical Exam  BP 113/84 (BP Location: Left Arm, Patient Position: Sitting, Cuff Size: Large)   Pulse (!) 104   Temp (!) 97.5 F (36.4 C)   Ht 5\' 3"  (1.6 m)   Wt 272 lb 12.8 oz (123.7 kg)   SpO2 100%   BMI 48.32 kg/m   Wt Readings from Last 5 Encounters:  08/05/21 272 lb 12.8 oz (123.7 kg)  08/04/21 275 lb (124.7 kg)  08/03/21 275 lb 1.9 oz (124.8 kg)  07/13/21 282 lb 12 oz (128.3 kg)  07/11/21 276 lb 2 oz (125.2 kg)     Physical Exam Vitals and nursing note reviewed.  Constitutional:      General: She is not in acute distress.    Appearance: She is well-developed.  Cardiovascular:     Rate and Rhythm: Normal rate and regular rhythm.  Pulmonary:     Effort: Pulmonary effort is normal.     Breath sounds: Normal breath sounds.  Neurological:     Mental Status: She is alert and oriented to person, place, and time.      Lab Results:  CBC    Component Value Date/Time   WBC 6.7 08/04/2021 1651   RBC 3.03 (L) 08/04/2021 1651   HGB 10.2 (L) 08/04/2021 1651   HGB 11.6 (L) 08/01/2021 1007   HCT 30.4 (L) 08/04/2021 1651   PLT 191 08/04/2021 1651   PLT 304 08/01/2021 1007   MCV 100.3 (H) 08/04/2021 1651   MCH 33.7 08/04/2021 1651   MCHC 33.6 08/04/2021 1651   RDW 14.9 08/04/2021 1651   LYMPHSABS 0.4 (L) 08/04/2021 1651   MONOABS 0.1 08/04/2021 1651   EOSABS 0.2 08/04/2021 1651   BASOSABS 0.0 08/04/2021 1651    BMET    Component Value Date/Time   NA 137 08/04/2021 1651   NA 143 05/21/2017 1101   K 3.2 (L) 08/04/2021 1651   CL 104 08/04/2021 1651   CO2 28 08/04/2021 1651   GLUCOSE 102 (H) 08/04/2021 1651   BUN 14 08/04/2021 1651   BUN 13 05/21/2017 1101   CREATININE 0.71 08/04/2021 1651   CREATININE 0.87 08/01/2021 1007   CREATININE 0.78 08/02/2015 1944   CALCIUM 8.3 (L) 08/04/2021 1651   GFRNONAA >60 08/04/2021 1651   GFRNONAA >60 08/01/2021 1007    GFRAA 92 05/21/2017 1101    BNP    Component Value Date/Time   BNP 130.5 (H) 08/04/2021 1651    ProBNP No results found for: "PROBNP"  Imaging: DG Chest Port 1 View  Result Date: 08/04/2021 CLINICAL DATA:  Shortness of breath. EXAM: PORTABLE CHEST 1 VIEW COMPARISON:  July 14, 2021 FINDINGS: Injectable port in stable position. The cardiac silhouette is obscured by left pleural effusion which is radiographically increased from July 14, 2021.  Mild interstitial pulmonary edema. IMPRESSION: 1. Enlarging left pleural effusion. 2. Mild interstitial pulmonary edema. Electronically Signed   By: Fidela Salisbury M.D.   On: 08/04/2021 17:11   US Thoracentesis Asp Pleural space w/IMG guide  Result Date: 07/14/2021 INDICATION: History of metastatic small cell lung cancer with recurrent left pleural effusion. Request received for therapeutic left thoracentesis. EXAM: ULTRASOUND GUIDED THERAPEUTIC LEFT THORACENTESIS MEDICATIONS: 10 mL 1 % lidocaine COMPLICATIONS: None immediate. PROCEDURE: An ultrasound guided thoracentesis was thoroughly discussed with the patient and questions answered. The benefits, risks, alternatives and complications were also discussed. The patient understands and wishes to proceed with the procedure. Written consent was obtained. Ultrasound was performed to localize and mark an adequate pocket of fluid in the left chest. The area was then prepped and draped in the normal sterile fashion. 1% Lidocaine was used for local anesthesia. Under ultrasound guidance a 6 Fr Safe-T-Centesis catheter was introduced. Thoracentesis was performed. The catheter was removed and a dressing applied. FINDINGS: A total of approximately 1.1 L of hazy, amber fluid was removed. IMPRESSION: Successful ultrasound guided left thoracentesis yielding 1.1 L of pleural fluid. Read by: Narda Rutherford, AGNP-BC Electronically Signed   By: Aletta Edouard M.D.   On: 07/14/2021 16:32   DG Chest Port 1 View  Result Date:  07/14/2021 CLINICAL DATA:  Status post thoracentesis. EXAM: PORTABLE CHEST 1 VIEW COMPARISON:  Chest x-ray 06/22/2021. FINDINGS: Right chest port catheter tip projects over the SVC. Left pleural effusion has decreased from prior. There is new haziness in the left mid lung silhouetting the left cardiac border. Right lung is clear. No evidence for pneumothorax or acute fracture. Cardiomediastinal silhouette appears stable. IMPRESSION: 1. Increasing left mid lung airspace disease. 2. Decreasing small left pleural effusion. Electronically Signed   By: Ronney Asters M.D.   On: 07/14/2021 16:11   CT Chest W Contrast  Result Date: 07/07/2021 CLINICAL DATA:  Metastatic lung cancer restaging, ongoing chemotherapy, former smoker * Tracking Code: BO * EXAM: CT CHEST, ABDOMEN, AND PELVIS WITH CONTRAST TECHNIQUE: Multidetector CT imaging of the chest, abdomen and pelvis was performed following the standard protocol during bolus administration of intravenous contrast. RADIATION DOSE REDUCTION: This exam was performed according to the departmental dose-optimization program which includes automated exposure control, adjustment of the mA and/or kV according to patient size and/or use of iterative reconstruction technique. CONTRAST:  152mL OMNIPAQUE IOHEXOL 300 MG/ML SOLN, additional oral enteric contrast COMPARISON:  PET-CT, 06/06/2021, CT chest, 05/13/2021 FINDINGS: CT CHEST FINDINGS Cardiovascular: Right chest port catheter. Aortic atherosclerosis. Normal heart size. Left coronary artery calcifications. No pericardial effusion. Mediastinum/Nodes: Interval decrease in size of an enlarged, previously FDG avid left epicardial lymph node, measuring 0.8 cm, previously 1.2 cm (series 2, image 27). Unchanged subcentimeter AP window lymph nodes (series 2, image 19). No other enlarged mediastinal, hilar, or axillary lymph nodes. Thyroid gland, trachea, and esophagus demonstrate no significant findings. Lungs/Pleura: Mild paraseptal  emphysema. Interval decrease in size and solid character of a mass of the subpleural anterior left upper lobe, measuring 2.8 x 1.4 cm, previously 3.0 x 1.9 cm (series 4, image 37). Slightly diminished volume of a moderate left pleural effusion with associated atelectasis or consolidation and diminished pleural thickening and nodularity, index nodule dependently measuring 1.8 x 0.9 cm, previously 3.1 x 1.5 cm (series 2, image 22). Musculoskeletal: No chest wall mass. CT ABDOMEN PELVIS FINDINGS Hepatobiliary: No focal liver abnormality is seen. Status post cholecystectomy. No biliary dilatation. Pancreas: Unremarkable. No pancreatic ductal dilatation or  surrounding inflammatory changes. Spleen: Normal in size without significant abnormality. Adrenals/Urinary Tract: Subcentimeter benign adenoma of the left adrenal gland, of macroscopic fatty attenuation and previously not FDG avid (series 2, image 54). Kidneys are normal, without renal calculi, solid lesion, or hydronephrosis. Small calculus noted in the left ovarian vein, which does not reflect a urinary tract calculus (series 2, image 74). Bladder is unremarkable. Stomach/Bowel: Stomach is within normal limits. Appendix appears normal. No evidence of bowel wall thickening, distention, or inflammatory changes. Vascular/Lymphatic: Aortic atherosclerosis. Unchanged prominent left crural lymph node measuring 1.3 x 0.8 cm, previously FDG avid (series 2, image 58). No other enlarged abdominal or pelvic lymph nodes. Reproductive: Status post hysterectomy. Other: No abdominal wall hernia or abnormality. No ascites. Musculoskeletal: No acute osseous findings. Subtle sclerosis of the anterior right fifth rib near the costochondral junction (series 2, image 29). IMPRESSION: 1. Interval decrease in size and solid character of a mass of the subpleural anterior left upper lobe, consistent with treatment response. 2. Slightly diminished volume of a moderate left pleural effusion  with associated atelectasis or consolidation and diminished pleural thickening and nodularity, consistent with treatment response of pleural metastatic disease 3. Interval decrease in size of an enlarged, previously FDG avid left epicardial lymph node, with unchanged subcentimeter AP window and left crural lymph nodes. Findings are consistent with treatment response of nodal metastatic disease. 4. No evidence of soft tissue metastatic disease to the abdomen or pelvis. 5. Subtle sclerosis of the anterior right fifth rib, at the site of FDG avid osseous metastasis. No other CT correlate for previously noted FDG avid osseous metastases. 6. Emphysema. 7. Coronary artery disease. Aortic Atherosclerosis (ICD10-I70.0) and Emphysema (ICD10-J43.9). Electronically Signed   By: Delanna Ahmadi M.D.   On: 07/07/2021 14:02   CT Abdomen Pelvis W Contrast  Result Date: 07/07/2021 CLINICAL DATA:  Metastatic lung cancer restaging, ongoing chemotherapy, former smoker * Tracking Code: BO * EXAM: CT CHEST, ABDOMEN, AND PELVIS WITH CONTRAST TECHNIQUE: Multidetector CT imaging of the chest, abdomen and pelvis was performed following the standard protocol during bolus administration of intravenous contrast. RADIATION DOSE REDUCTION: This exam was performed according to the departmental dose-optimization program which includes automated exposure control, adjustment of the mA and/or kV according to patient size and/or use of iterative reconstruction technique. CONTRAST:  12mL OMNIPAQUE IOHEXOL 300 MG/ML SOLN, additional oral enteric contrast COMPARISON:  PET-CT, 06/06/2021, CT chest, 05/13/2021 FINDINGS: CT CHEST FINDINGS Cardiovascular: Right chest port catheter. Aortic atherosclerosis. Normal heart size. Left coronary artery calcifications. No pericardial effusion. Mediastinum/Nodes: Interval decrease in size of an enlarged, previously FDG avid left epicardial lymph node, measuring 0.8 cm, previously 1.2 cm (series 2, image 27).  Unchanged subcentimeter AP window lymph nodes (series 2, image 19). No other enlarged mediastinal, hilar, or axillary lymph nodes. Thyroid gland, trachea, and esophagus demonstrate no significant findings. Lungs/Pleura: Mild paraseptal emphysema. Interval decrease in size and solid character of a mass of the subpleural anterior left upper lobe, measuring 2.8 x 1.4 cm, previously 3.0 x 1.9 cm (series 4, image 37). Slightly diminished volume of a moderate left pleural effusion with associated atelectasis or consolidation and diminished pleural thickening and nodularity, index nodule dependently measuring 1.8 x 0.9 cm, previously 3.1 x 1.5 cm (series 2, image 22). Musculoskeletal: No chest wall mass. CT ABDOMEN PELVIS FINDINGS Hepatobiliary: No focal liver abnormality is seen. Status post cholecystectomy. No biliary dilatation. Pancreas: Unremarkable. No pancreatic ductal dilatation or surrounding inflammatory changes. Spleen: Normal in size without significant abnormality. Adrenals/Urinary  Tract: Subcentimeter benign adenoma of the left adrenal gland, of macroscopic fatty attenuation and previously not FDG avid (series 2, image 54). Kidneys are normal, without renal calculi, solid lesion, or hydronephrosis. Small calculus noted in the left ovarian vein, which does not reflect a urinary tract calculus (series 2, image 74). Bladder is unremarkable. Stomach/Bowel: Stomach is within normal limits. Appendix appears normal. No evidence of bowel wall thickening, distention, or inflammatory changes. Vascular/Lymphatic: Aortic atherosclerosis. Unchanged prominent left crural lymph node measuring 1.3 x 0.8 cm, previously FDG avid (series 2, image 58). No other enlarged abdominal or pelvic lymph nodes. Reproductive: Status post hysterectomy. Other: No abdominal wall hernia or abnormality. No ascites. Musculoskeletal: No acute osseous findings. Subtle sclerosis of the anterior right fifth rib near the costochondral junction  (series 2, image 29). IMPRESSION: 1. Interval decrease in size and solid character of a mass of the subpleural anterior left upper lobe, consistent with treatment response. 2. Slightly diminished volume of a moderate left pleural effusion with associated atelectasis or consolidation and diminished pleural thickening and nodularity, consistent with treatment response of pleural metastatic disease 3. Interval decrease in size of an enlarged, previously FDG avid left epicardial lymph node, with unchanged subcentimeter AP window and left crural lymph nodes. Findings are consistent with treatment response of nodal metastatic disease. 4. No evidence of soft tissue metastatic disease to the abdomen or pelvis. 5. Subtle sclerosis of the anterior right fifth rib, at the site of FDG avid osseous metastasis. No other CT correlate for previously noted FDG avid osseous metastases. 6. Emphysema. 7. Coronary artery disease. Aortic Atherosclerosis (ICD10-I70.0) and Emphysema (ICD10-J43.9). Electronically Signed   By: Delanna Ahmadi M.D.   On: 07/07/2021 14:02     Assessment & Plan:   Essential hypertension - hydrochlorothiazide (MICROZIDE) 12.5 MG capsule; Take 1 capsule (12.5 mg total) by mouth daily.  Dispense: 30 capsule; Refill: 0  - continue lasix as needed   Follow up:  Follow up in 6 months     [name], NP 08/05/2021

## 2021-08-05 NOTE — Patient Instructions (Signed)
1. Essential hypertension  - hydrochlorothiazide (MICROZIDE) 12.5 MG capsule; Take 1 capsule (12.5 mg total) by mouth daily.  Dispense: 30 capsule; Refill: 0  - continue lasix as needed   Follow up:  Follow up in 6 months

## 2021-08-08 ENCOUNTER — Inpatient Hospital Stay: Payer: Medicaid Other | Attending: Internal Medicine

## 2021-08-08 ENCOUNTER — Other Ambulatory Visit: Payer: Self-pay

## 2021-08-08 DIAGNOSIS — C3412 Malignant neoplasm of upper lobe, left bronchus or lung: Secondary | ICD-10-CM | POA: Diagnosis present

## 2021-08-08 DIAGNOSIS — Z95828 Presence of other vascular implants and grafts: Secondary | ICD-10-CM

## 2021-08-08 DIAGNOSIS — C3492 Malignant neoplasm of unspecified part of left bronchus or lung: Secondary | ICD-10-CM

## 2021-08-08 LAB — CMP (CANCER CENTER ONLY)
ALT: 29 U/L (ref 0–44)
AST: 23 U/L (ref 15–41)
Albumin: 3.5 g/dL (ref 3.5–5.0)
Alkaline Phosphatase: 56 U/L (ref 38–126)
Anion gap: 6 (ref 5–15)
BUN: 9 mg/dL (ref 6–20)
CO2: 28 mmol/L (ref 22–32)
Calcium: 9.4 mg/dL (ref 8.9–10.3)
Chloride: 103 mmol/L (ref 98–111)
Creatinine: 0.61 mg/dL (ref 0.44–1.00)
GFR, Estimated: >60 mL/min (ref >60)
Glucose, Bld: 110 mg/dL — ABNORMAL HIGH (ref 70–99)
Potassium: 3.6 mmol/L (ref 3.5–5.1)
Sodium: 137 mmol/L (ref 135–145)
Total Bilirubin: 0.6 mg/dL (ref 0.3–1.2)
Total Protein: 6.7 g/dL (ref 6.5–8.1)

## 2021-08-08 LAB — CBC WITH DIFFERENTIAL (CANCER CENTER ONLY)
Abs Immature Granulocytes: 0.14 10*3/uL — ABNORMAL HIGH (ref 0.00–0.07)
Basophils Absolute: 0.1 10*3/uL (ref 0.0–0.1)
Basophils Relative: 2 %
Eosinophils Absolute: 0 10*3/uL (ref 0.0–0.5)
Eosinophils Relative: 1 %
HCT: 27.3 % — ABNORMAL LOW (ref 36.0–46.0)
Hemoglobin: 9.6 g/dL — ABNORMAL LOW (ref 12.0–15.0)
Immature Granulocytes: 5 %
Lymphocytes Relative: 25 %
Lymphs Abs: 0.7 10*3/uL (ref 0.7–4.0)
MCH: 33.9 pg (ref 26.0–34.0)
MCHC: 35.2 g/dL (ref 30.0–36.0)
MCV: 96.5 fL (ref 80.0–100.0)
Monocytes Absolute: 0.1 10*3/uL (ref 0.1–1.0)
Monocytes Relative: 3 %
Neutro Abs: 1.8 10*3/uL (ref 1.7–7.7)
Neutrophils Relative %: 64 %
Platelet Count: 133 10*3/uL — ABNORMAL LOW (ref 150–400)
RBC: 2.83 MIL/uL — ABNORMAL LOW (ref 3.87–5.11)
RDW: 13.9 % (ref 11.5–15.5)
Smear Review: NORMAL
WBC Count: 2.8 10*3/uL — ABNORMAL LOW (ref 4.0–10.5)
nRBC: 0 % (ref 0.0–0.2)

## 2021-08-08 LAB — TSH: TSH: 0.561 u[IU]/mL (ref 0.350–4.500)

## 2021-08-08 MED ORDER — HEPARIN SOD (PORK) LOCK FLUSH 100 UNIT/ML IV SOLN
500.0000 [IU] | Freq: Once | INTRAVENOUS | Status: AC
  Administered 2021-08-08: 500 [IU]

## 2021-08-08 MED ORDER — SODIUM CHLORIDE 0.9% FLUSH
10.0000 mL | Freq: Once | INTRAVENOUS | Status: AC
  Administered 2021-08-08: 10 mL

## 2021-08-12 ENCOUNTER — Telehealth: Payer: Self-pay | Admitting: *Deleted

## 2021-08-12 NOTE — Telephone Encounter (Signed)
Patient called office - left voice mail stating she is scheduled at [facility] on Monday 08/15/21 for CT  (chest/abdomen/pelvis ordered by Dr. Julien Nordmann ) and for CT on Saturday 7/17 at [facility].Marland Kitchen  She wanted to know if she should keep both appts.   Contacted patient - LVM: Informed  patient that CT on Monday was for chest/abd/pelvis at 5:30pm. Advised her to contact [facility]/Dr. Mariel Kansky' office on Monday and ask if CT ordered is same so that she can find out if she needs both.

## 2021-08-15 ENCOUNTER — Ambulatory Visit (HOSPITAL_COMMUNITY)
Admission: RE | Admit: 2021-08-15 | Discharge: 2021-08-15 | Disposition: A | Discharge status: Home / Self Care | Payer: Medicaid Other | Source: Ambulatory Visit | Attending: Internal Medicine | Admitting: Internal Medicine

## 2021-08-15 ENCOUNTER — Telehealth: Payer: Self-pay | Admitting: Internal Medicine

## 2021-08-15 ENCOUNTER — Inpatient Hospital Stay: Payer: Medicaid Other

## 2021-08-15 ENCOUNTER — Other Ambulatory Visit: Payer: Self-pay

## 2021-08-15 ENCOUNTER — Other Ambulatory Visit: Payer: Self-pay | Admitting: Internal Medicine

## 2021-08-15 DIAGNOSIS — C3490 Malignant neoplasm of unspecified part of unspecified bronchus or lung: Secondary | ICD-10-CM | POA: Diagnosis present

## 2021-08-15 DIAGNOSIS — J439 Emphysema, unspecified: Secondary | ICD-10-CM | POA: Diagnosis not present

## 2021-08-15 DIAGNOSIS — C3492 Malignant neoplasm of unspecified part of left bronchus or lung: Secondary | ICD-10-CM

## 2021-08-15 DIAGNOSIS — J9811 Atelectasis: Secondary | ICD-10-CM | POA: Diagnosis not present

## 2021-08-15 DIAGNOSIS — D3502 Benign neoplasm of left adrenal gland: Secondary | ICD-10-CM | POA: Diagnosis not present

## 2021-08-15 DIAGNOSIS — Z95828 Presence of other vascular implants and grafts: Secondary | ICD-10-CM

## 2021-08-15 DIAGNOSIS — J90 Pleural effusion, not elsewhere classified: Secondary | ICD-10-CM | POA: Diagnosis not present

## 2021-08-15 DIAGNOSIS — E876 Hypokalemia: Secondary | ICD-10-CM

## 2021-08-15 DIAGNOSIS — C7951 Secondary malignant neoplasm of bone: Secondary | ICD-10-CM | POA: Diagnosis not present

## 2021-08-15 DIAGNOSIS — C3412 Malignant neoplasm of upper lobe, left bronchus or lung: Secondary | ICD-10-CM | POA: Diagnosis not present

## 2021-08-15 LAB — CBC WITH DIFFERENTIAL (CANCER CENTER ONLY)
Abs Immature Granulocytes: 0 10*3/uL (ref 0.00–0.07)
Basophils Absolute: 0 10*3/uL (ref 0.0–0.1)
Basophils Relative: 1 %
Eosinophils Absolute: 0 10*3/uL (ref 0.0–0.5)
Eosinophils Relative: 2 %
HCT: 28.5 % — ABNORMAL LOW (ref 36.0–46.0)
Hemoglobin: 9.7 g/dL — ABNORMAL LOW (ref 12.0–15.0)
Immature Granulocytes: 0 %
Lymphocytes Relative: 49 %
Lymphs Abs: 1 10*3/uL (ref 0.7–4.0)
MCH: 33.7 pg (ref 26.0–34.0)
MCHC: 34 g/dL (ref 30.0–36.0)
MCV: 99 fL (ref 80.0–100.0)
Monocytes Absolute: 0.5 10*3/uL (ref 0.1–1.0)
Monocytes Relative: 24 %
Neutro Abs: 0.5 10*3/uL — ABNORMAL LOW (ref 1.7–7.7)
Neutrophils Relative %: 24 %
Platelet Count: 161 10*3/uL (ref 150–400)
RBC: 2.88 MIL/uL — ABNORMAL LOW (ref 3.87–5.11)
RDW: 15.9 % — ABNORMAL HIGH (ref 11.5–15.5)
WBC Count: 2.1 10*3/uL — ABNORMAL LOW (ref 4.0–10.5)
nRBC: 0 % (ref 0.0–0.2)

## 2021-08-15 LAB — CMP (CANCER CENTER ONLY)
ALT: 11 U/L (ref 0–44)
AST: 13 U/L — ABNORMAL LOW (ref 15–41)
Albumin: 3.6 g/dL (ref 3.5–5.0)
Alkaline Phosphatase: 47 U/L (ref 38–126)
Anion gap: 7 (ref 5–15)
BUN: 8 mg/dL (ref 6–20)
CO2: 30 mmol/L (ref 22–32)
Calcium: 9.5 mg/dL (ref 8.9–10.3)
Chloride: 102 mmol/L (ref 98–111)
Creatinine: 0.71 mg/dL (ref 0.44–1.00)
GFR, Estimated: >60 mL/min (ref >60)
Glucose, Bld: 109 mg/dL — ABNORMAL HIGH (ref 70–99)
Potassium: 2.9 mmol/L — ABNORMAL LOW (ref 3.5–5.1)
Sodium: 139 mmol/L (ref 135–145)
Total Bilirubin: 0.4 mg/dL (ref 0.3–1.2)
Total Protein: 6.8 g/dL (ref 6.5–8.1)

## 2021-08-15 MED ORDER — IOHEXOL 9 MG/ML PO SOLN
ORAL | Status: AC
  Filled 2021-08-15: qty 1000

## 2021-08-15 MED ORDER — SODIUM CHLORIDE 0.9% FLUSH
10.0000 mL | Freq: Once | INTRAVENOUS | Status: AC
  Administered 2021-08-15: 10 mL

## 2021-08-15 MED ORDER — IOHEXOL 300 MG/ML  SOLN
100.0000 mL | Freq: Once | INTRAMUSCULAR | Status: AC | PRN
  Administered 2021-08-15: 100 mL via INTRAVENOUS

## 2021-08-15 MED ORDER — POTASSIUM CHLORIDE CRYS ER 20 MEQ PO TBCR: 40.0000 meq | EXTENDED_RELEASE_TABLET | Freq: Every day | ORAL | 0 refills | Status: AC

## 2021-08-15 MED ORDER — HEPARIN SOD (PORK) LOCK FLUSH 100 UNIT/ML IV SOLN
500.0000 [IU] | Freq: Once | INTRAVENOUS | Status: AC
  Administered 2021-08-15: 500 [IU] via INTRAVENOUS

## 2021-08-15 MED ORDER — HEPARIN SOD (PORK) LOCK FLUSH 100 UNIT/ML IV SOLN
INTRAVENOUS | Status: AC
  Filled 2021-08-15: qty 5

## 2021-08-15 NOTE — Telephone Encounter (Signed)
Called patient regarding July, August and September appointments. Patient has been called and voicemail was left regarding upcoming appointments.

## 2021-08-16 DIAGNOSIS — C3492 Malignant neoplasm of unspecified part of left bronchus or lung: Secondary | ICD-10-CM | POA: Diagnosis not present

## 2021-08-16 NOTE — Progress Notes (Deleted)
[facility] OFFICE PROGRESS NOTE  Patricia Foy, NP Patricia Pena [address] [place] [address]  DIAGNOSIS: Extensive stage (T1c, N1, M1c) small cell lung cancer presented with left upper lobe lung nodule in addition to left hilar adenopathy, malignant left pleural effusion in addition to abdominal lymphadenopathy and metastatic bone lesions diagnosed in April 2023  PRIOR THERAPY: None  CURRENT THERAPY: Systemic chemotherapy with carboplatin for AUC of 5 on day 1, etoposide 100 Mg/M2 on days 1, 2 and 3 with Cosela 240 Mg/M2 before the chemotherapy and Imfinzi 1500 Mg IV on day one of the chemotherapy every 3 weeks.  Status post 4 cycles.  She is going to transfer her care to [facility] starting from cycle #4 for consideration of clinical trial with the addition of Taratamab.  INTERVAL HISTORY: Patricia Pena 56 y.o. female returns to the clinic today for a follow-up visit.  The patient is feeling fairly well today without any concerning complaints except for ***.   The patient has been seeing Dr. Lennart Pena for a second opinion at [facility].  The patient is interested in enrolling in a clinical trial and she would like to treat her cancer aggressively.  Dr. Mariel Pena is going to evaluate her eligibility for the addition of taratamab starting from after #4.  She saw Dr. Mariel Pena for telemedicine visit on 06/28/2021. Dr. Mariel Pena recommends getting 4 cycles of triplet therapy locally. Further, some data indicate both greater probability of response and lower prob of toxicity when tarlatamab is given with lower bulk, which hopefully will be the case w/4c. She is expected to have a restaging CT scan on *** and follow up with Dr. Mariel Pena on ***.   BP and *** Swelling. She saw her PCP recently regarding blood pressure management.   Otherwise, she denies any new concerning complaints today except for fatigue and decreased appetite.  She *** a few pounds since her last appointment.  She does not  drink any supplemental drinks.  She denies any fever, chills, or night sweats.  Pleural effusion***.   Denies any unusual cough or hemoptysis.  She continues to have an occasional dry cough.  She reports that her cough is " not all the time". Denies any chest pain. She has some nausea.  Denies any vomiting, diarrhea, or constipation. Denies any headache or visual changes besides she states she is overdue for her eye exam and notes her prescription for glasses has been dated for a few years. She notes a few years ago she started having cataracts.  Denies any rashes or skin changes. She recently had a restaging CT scan of the chest, abdomen, and pelvis. She is here for evaluation and repeat blood work before starting ***???  MEDICAL HISTORY: Past Medical History:  Diagnosis Date   Arthritis    Cancer ([place])    Hypertension    Incisional hernia without mention of obstruction or gangrene    Morbid obesity ([place])    Seizures ([place])    last one was in [place]:  is allergic to codeine.  MEDICATIONS:  Current Outpatient Medications  Medication Sig Dispense Refill   CALCIUM-VITAMIN D PO Take 2,000 mg by mouth daily.     cholecalciferol (VITAMIN D) 1000 units tablet Take 1,000 Units by mouth daily.     Cyanocobalamin (VITAMIN B-12 PO) Take 1 tablet by mouth daily. (Patient not taking: Reported on 06/20/2021)     Desoximetasone 0.05 % OINT Apply 1  application. topically daily as needed (For skin rash).  0   furosemide (LASIX) 20 MG tablet Take 1 tablet (20 mg total) by mouth daily as needed (weight gain of 3lbs in 1 day or 5lbs in 1 week). 10 tablet 0   hydrochlorothiazide (MICROZIDE) 12.5 MG capsule Take 1 capsule (12.5 mg total) by mouth daily. 30 capsule 0   hydrOXYzine (ATARAX) 10 MG tablet Take 1 tablet (10 mg total) by mouth 3 (three) times daily as needed. 30 tablet 0   lidocaine-prilocaine (EMLA) cream Apply to the Port-A-Cath site 30 minutes before chemotherapy (Patient not taking:  Reported on 06/20/2021) 30 g 0   loperamide (IMODIUM) 2 MG capsule Take 2 mg by mouth as needed for diarrhea or loose stools. Take 2 capsules  after first diarrhea stool, then take one capsule after each subsequent stool up to 6-8/day     LORazepam (ATIVAN) 1 MG tablet 1 tablet 30 minutes before MRI of the brain. (Patient not taking: Reported on 06/20/2021) 1 tablet 0   ondansetron (ZOFRAN) 8 MG tablet Take 1 tablet (8 mg total) by mouth every 8 (eight) hours as needed for nausea or vomiting. 30 tablet 1   potassium chloride SA (KLOR-CON M) 20 MEQ tablet Take 2 tablets (40 mEq total) by mouth daily. 14 tablet 0   prochlorperazine (COMPAZINE) 10 MG tablet Take 1 tablet (10 mg total) by mouth every 6 (six) hours as needed. 30 tablet 2   No current facility-administered medications for this visit.    SURGICAL HISTORY:  Past Surgical History:  Procedure Laterality Date   ABDOMINAL HYSTERECTOMY  2010   at [facility] Right 2010   BREAST CYST EXCISION  1994   right breast   BREAST EXCISIONAL BIOPSY Right    benign cyst   GALLBLADDER SURGERY  1990   IR IMAGING GUIDED PORT INSERTION  05/31/2021   LEFT HEART CATHETERIZATION WITH CORONARY ANGIOGRAM N/A 06/14/2011   Procedure: LEFT HEART CATHETERIZATION WITH CORONARY ANGIOGRAM;  Surgeon: Patricia Furbish, MD;  Location: [facility] CATH LAB;  Service: Cardiovascular;  Laterality: N/A;   VAGINA SURGERY  2007    REVIEW OF SYSTEMS:   Review of Systems  Constitutional: Negative for appetite change, chills, fatigue, fever and unexpected weight change.  HENT:   Negative for mouth sores, nosebleeds, sore throat and trouble swallowing.   Eyes: Negative for eye problems and icterus.  Respiratory: Negative for cough, hemoptysis, shortness of breath and wheezing.   Cardiovascular: Negative for chest pain and leg swelling.  Gastrointestinal: Negative for abdominal pain, constipation, diarrhea, nausea and vomiting.  Genitourinary: Negative for bladder  incontinence, difficulty urinating, dysuria, frequency and hematuria.   Musculoskeletal: Negative for back pain, gait problem, neck pain and neck stiffness.  Skin: Negative for itching and rash.  Neurological: Negative for dizziness, extremity weakness, gait problem, headaches, light-headedness and seizures.  Hematological: Negative for adenopathy. Does not bruise/bleed easily.  Psychiatric/Behavioral: Negative for confusion, depression and sleep disturbance. The patient is not nervous/anxious.     PHYSICAL EXAMINATION:  There were no vitals taken for this visit.  ECOG PERFORMANCE STATUS: {CHL ONC ECOG [id number]  Physical Exam  Constitutional: Oriented to person, place, and time and well-developed, well-nourished, and in no distress. No distress.  HENT:  Head: Normocephalic and atraumatic.  Mouth/Throat: Oropharynx is clear and moist. No oropharyngeal exudate.  Eyes: Conjunctivae are normal. Right eye exhibits no discharge. Left eye exhibits no discharge. No scleral icterus.  Neck: Normal range of motion. Neck  supple.  Cardiovascular: Normal rate, regular rhythm, normal heart sounds and intact distal pulses.   Pulmonary/Chest: Effort normal and breath sounds normal. No respiratory distress. No wheezes. No rales.  Abdominal: Soft. Bowel sounds are normal. Exhibits no distension and no mass. There is no tenderness.  Musculoskeletal: Normal range of motion. Exhibits no edema.  Lymphadenopathy:    No cervical adenopathy.  Neurological: Alert and oriented to person, place, and time. Exhibits normal muscle tone. Gait normal. Coordination normal.  Skin: Skin is warm and dry. No rash noted. Not diaphoretic. No erythema. No pallor.  Psychiatric: Mood, memory and judgment normal.  Vitals reviewed.  LABORATORY DATA: Lab Results  Component Value Date   WBC 2.1 (L) 08/15/2021   HGB 9.7 (L) 08/15/2021   HCT 28.5 (L) 08/15/2021   MCV 99.0 08/15/2021   PLT 161 08/15/2021      Chemistry       Component Value Date/Time   NA 139 08/15/2021 1329   NA 143 05/21/2017 1101   K 2.9 (L) 08/15/2021 1329   CL 102 08/15/2021 1329   CO2 30 08/15/2021 1329   BUN 8 08/15/2021 1329   BUN 13 05/21/2017 1101   CREATININE 0.71 08/15/2021 1329   CREATININE 0.78 08/02/2015 1944      Component Value Date/Time   CALCIUM 9.5 08/15/2021 1329   ALKPHOS 47 08/15/2021 1329   AST 13 (L) 08/15/2021 1329   ALT 11 08/15/2021 1329   BILITOT 0.4 08/15/2021 1329       RADIOGRAPHIC STUDIES:  CT Chest W Contrast  Result Date: 08/16/2021 CLINICAL DATA:  Primary Cancer Type: Lung Imaging Indication: Assess response to therapy Interval therapy since last imaging? Yes Initial Cancer Diagnosis Date: 05/15/2021; Established by: Biopsy-proven Detailed Pathology: Extensive stage small cell lung cancer. Primary Tumor location: Left upper lobe. Metastatic bone lesions. Surgeries: Hysterectomy. Gallbladder surgery. Chemotherapy: Yes; Ongoing? Yes; Most recent administration: 08/01/2021 Immunotherapy?  Yes; Type: Imfinzi; Ongoing? Yes Radiation therapy? No * Tracking Code: BO * EXAM: CT CHEST, ABDOMEN, AND PELVIS WITH CONTRAST TECHNIQUE: Multidetector CT imaging of the chest, abdomen and pelvis was performed following the standard protocol during bolus administration of intravenous contrast. RADIATION DOSE REDUCTION: This exam was performed according to the departmental dose-optimization program which includes automated exposure control, adjustment of the mA and/or kV according to patient size and/or use of iterative reconstruction technique. CONTRAST:  110mL OMNIPAQUE IOHEXOL 300 MG/ML  SOLN COMPARISON:  Most recent CT chest, abdomen and pelvis 07/06/2021 FINDINGS: CT CHEST FINDINGS Cardiovascular: No acute findings. Aortic atherosclerotic calcification incidentally noted. Mediastinum/Lymph Nodes: No masses or pathologically enlarged lymph nodes identified. Previously noted sub-cm left epicardial lymph node has  resolved. Lungs/Pleura: Small to moderate left pleural effusion shows slight decrease in size since previous study. Decreased pleural soft tissue nodularity also noted. Lingular atelectasis is nearly completely resolved since prior study. Mild paraseptal emphysema again noted. Subpleural nodule in the anterior left upper lobe measures 2.6 x 1.3 cm on image 31/4, without significant change since prior exam. No new or enlarging pulmonary nodules or masses identified. Musculoskeletal:  No suspicious bone lesions identified. CT ABDOMEN AND PELVIS FINDINGS Hepatobiliary: No masses identified. Prior cholecystectomy. No evidence of biliary obstruction. Pancreas:  No mass or inflammatory changes. Spleen:  Within normal limits in size and appearance. Adrenals/Urinary tract: Stable small low-attenuation left adrenal mass, consistent with benign adenoma. No renal masses or hydronephrosis. Stomach/Bowel: No evidence of obstruction, inflammatory process, or abnormal fluid collections. Vascular/Lymphatic: No pathologically enlarged lymph nodes identified. No  acute vascular findings. Aortic atherosclerotic calcification incidentally noted. Reproductive: No mass or other significant abnormality identified. Prior hysterectomy noted. Adnexal regions are unremarkable in appearance. Other:  None. Musculoskeletal:  No suspicious bone lesions identified. IMPRESSION: Mild decreased size of left pleural effusion and pleural soft tissue nodularity. Stable 2.6 cm subpleural nodule in anterior left upper lobe. No new or progressive disease identified. No evidence of metastatic disease within the abdomen or pelvis. Stable small benign left adrenal adenoma. Aortic Atherosclerosis (ICD10-I70.0) and Emphysema (ICD10-J43.9). Electronically Signed   By: Marlaine Hind M.D.   On: 08/16/2021 11:58   CT Abdomen Pelvis W Contrast  Result Date: 08/16/2021 CLINICAL DATA:  Primary Cancer Type: Lung Imaging Indication: Assess response to therapy Interval  therapy since last imaging? Yes Initial Cancer Diagnosis Date: 05/15/2021; Established by: Biopsy-proven Detailed Pathology: Extensive stage small cell lung cancer. Primary Tumor location: Left upper lobe. Metastatic bone lesions. Surgeries: Hysterectomy. Gallbladder surgery. Chemotherapy: Yes; Ongoing? Yes; Most recent administration: 08/01/2021 Immunotherapy?  Yes; Type: Imfinzi; Ongoing? Yes Radiation therapy? No * Tracking Code: BO * EXAM: CT CHEST, ABDOMEN, AND PELVIS WITH CONTRAST TECHNIQUE: Multidetector CT imaging of the chest, abdomen and pelvis was performed following the standard protocol during bolus administration of intravenous contrast. RADIATION DOSE REDUCTION: This exam was performed according to the departmental dose-optimization program which includes automated exposure control, adjustment of the mA and/or kV according to patient size and/or use of iterative reconstruction technique. CONTRAST:  133m OMNIPAQUE IOHEXOL 300 MG/ML  SOLN COMPARISON:  Most recent CT chest, abdomen and pelvis 07/06/2021 FINDINGS: CT CHEST FINDINGS Cardiovascular: No acute findings. Aortic atherosclerotic calcification incidentally noted. Mediastinum/Lymph Nodes: No masses or pathologically enlarged lymph nodes identified. Previously noted sub-cm left epicardial lymph node has resolved. Lungs/Pleura: Small to moderate left pleural effusion shows slight decrease in size since previous study. Decreased pleural soft tissue nodularity also noted. Lingular atelectasis is nearly completely resolved since prior study. Mild paraseptal emphysema again noted. Subpleural nodule in the anterior left upper lobe measures 2.6 x 1.3 cm on image 31/4, without significant change since prior exam. No new or enlarging pulmonary nodules or masses identified. Musculoskeletal:  No suspicious bone lesions identified. CT ABDOMEN AND PELVIS FINDINGS Hepatobiliary: No masses identified. Prior cholecystectomy. No evidence of biliary obstruction.  Pancreas:  No mass or inflammatory changes. Spleen:  Within normal limits in size and appearance. Adrenals/Urinary tract: Stable small low-attenuation left adrenal mass, consistent with benign adenoma. No renal masses or hydronephrosis. Stomach/Bowel: No evidence of obstruction, inflammatory process, or abnormal fluid collections. Vascular/Lymphatic: No pathologically enlarged lymph nodes identified. No acute vascular findings. Aortic atherosclerotic calcification incidentally noted. Reproductive: No mass or other significant abnormality identified. Prior hysterectomy noted. Adnexal regions are unremarkable in appearance. Other:  None. Musculoskeletal:  No suspicious bone lesions identified. IMPRESSION: Mild decreased size of left pleural effusion and pleural soft tissue nodularity. Stable 2.6 cm subpleural nodule in anterior left upper lobe. No new or progressive disease identified. No evidence of metastatic disease within the abdomen or pelvis. Stable small benign left adrenal adenoma. Aortic Atherosclerosis (ICD10-I70.0) and Emphysema (ICD10-J43.9). Electronically Signed   By: JMarlaine HindM.D.   On: 08/16/2021 11:58   DG Chest Port 1 View  Result Date: 08/04/2021 CLINICAL DATA:  Shortness of breath. EXAM: PORTABLE CHEST 1 VIEW COMPARISON:  July 14, 2021 FINDINGS: Injectable port in stable position. The cardiac silhouette is obscured by left pleural effusion which is radiographically increased from July 14, 2021. Mild interstitial pulmonary edema. IMPRESSION: 1. Enlarging left pleural  effusion. 2. Mild interstitial pulmonary edema. Electronically Signed   By: Fidela Salisbury M.D.   On: 08/04/2021 17:11     ASSESSMENT/PLAN:  This is a very pleasant 56 year old Caucasian female diagnosed with extensive stage (T1c, N1, M1 C) small cell lung cancer.  She presented with a left upper lobe lung nodule in addition to left hilar adenopathy, malignant left pleural effusion, abdominal adenopathy, and metastatic  lesions to the bone.  She was diagnosed in April 2023.  The patient recently had a brain MRI performed to complete the staging work-up which was negative for any intracranial metastatic disease but she did have a parietal bone lesion which is part of her known extensive metastatic disease to the bone.  Dr. Julien Nordmann previously reviewed this with the patient.  The patient is currently undergoing palliative systemic chemotherapy with carboplatin for an AUC of 5 on day 1, etoposide 100 mg per metered squared on days 1, 2, and 3 and immunotherapy with Imfinzi.  She met with Dr. Mariel Pena from [facility]. The patient would like to enroll in a clinical trial. She would like to treat her cancer aggressively. Dr. Mariel Pena is going to evaluate her eligibility for the addition of Taratamab to her treatment starting from after cycle #4.  She is going to receive cycle #4 locally before transitioning her care.   The patient is now status post 4 cycles.  She recently had a restaging CT scan performed.  Dr. Julien Nordmann personally and independently reviewed the scan and discussed the results with the patient today.  The scan did not show any evidence of disease progression.  We will hold off on proceeding with maintenance immunotherapy as we do not want to impact her eligibility for a clinical trial.  She is scheduled to see Dr. Mariel Pena on ***  Blood pressure?  Pleural effusion  We will not schedule any follow-up appointments at this time but would be happy to see the patient on an as-needed basis  The patient was advised to call immediately if she has any concerning symptoms in the interval. The patient voices understanding of current disease status and treatment options and is in agreement with the current care plan. All questions were answered. The patient knows to call the clinic with any problems, questions or concerns. We can certainly see the patient much sooner if necessary          No orders of the defined types were  placed in this encounter.    I spent {CHL ONC TIME VISIT - YTKPT:[id number]} counseling the patient face to face. The total time spent in the appointment was {CHL ONC TIME VISIT - ZGYFV:[id number]}.  Lynia Landry L Mayley Lish, PA-C 08/16/21

## 2021-08-18 ENCOUNTER — Telehealth: Payer: Self-pay | Admitting: Medical Oncology

## 2021-08-18 ENCOUNTER — Telehealth: Payer: Self-pay

## 2021-08-18 NOTE — Telephone Encounter (Signed)
Patient called asking if she should come to apt on 7/17 because she is involved with a trial study at [facility]. She will reach out to study coordinator at [facility] to gather more information.

## 2021-08-18 NOTE — Telephone Encounter (Signed)
Per Dr. Julien Nordmann, I LVM on pt phone and notified that her results are available on MyChart and will be discussed next visit.

## 2021-08-18 NOTE — Telephone Encounter (Signed)
Ct results requested. Next appt Monday,

## 2021-08-19 ENCOUNTER — Telehealth: Payer: Self-pay | Admitting: Medical Oncology

## 2021-08-19 DIAGNOSIS — C3492 Malignant neoplasm of unspecified part of left bronchus or lung: Secondary | ICD-10-CM | POA: Diagnosis not present

## 2021-08-19 DIAGNOSIS — Z006 Encounter for examination for normal comparison and control in clinical research program: Secondary | ICD-10-CM | POA: Diagnosis not present

## 2021-08-19 NOTE — Telephone Encounter (Signed)
Pt going to [facility] today for scans, then she has f/u appts at [facility] on Monday for clinical trial.  She cancelled her appts here for July 17th. Appts cancelled through 08/14.

## 2021-08-20 DIAGNOSIS — C3492 Malignant neoplasm of unspecified part of left bronchus or lung: Secondary | ICD-10-CM | POA: Diagnosis not present

## 2021-08-20 DIAGNOSIS — Z006 Encounter for examination for normal comparison and control in clinical research program: Secondary | ICD-10-CM | POA: Diagnosis not present

## 2021-08-22 ENCOUNTER — Inpatient Hospital Stay: Payer: Medicaid Other

## 2021-08-22 ENCOUNTER — Inpatient Hospital Stay: Payer: Medicaid Other | Admitting: Physician Assistant

## 2021-08-24 ENCOUNTER — Encounter: Payer: Self-pay | Admitting: Internal Medicine

## 2021-08-29 ENCOUNTER — Other Ambulatory Visit: Payer: Self-pay

## 2021-08-30 ENCOUNTER — Telehealth: Payer: Self-pay

## 2021-08-30 ENCOUNTER — Other Ambulatory Visit (HOSPITAL_COMMUNITY): Payer: Self-pay

## 2021-08-30 ENCOUNTER — Other Ambulatory Visit: Payer: Self-pay | Admitting: Nurse Practitioner

## 2021-08-30 DIAGNOSIS — I10 Essential (primary) hypertension: Secondary | ICD-10-CM

## 2021-08-30 NOTE — Telephone Encounter (Signed)
Hydrochlorothiazide  Water pill

## 2021-08-31 ENCOUNTER — Other Ambulatory Visit: Payer: Self-pay | Admitting: Nurse Practitioner

## 2021-08-31 DIAGNOSIS — I10 Essential (primary) hypertension: Secondary | ICD-10-CM

## 2021-08-31 MED ORDER — HYDROCHLOROTHIAZIDE 12.5 MG PO CAPS: 12.5000 mg | ORAL_CAPSULE | Freq: Every day | ORAL | 2 refills | Status: DC

## 2021-09-02 DIAGNOSIS — I340 Nonrheumatic mitral (valve) insufficiency: Secondary | ICD-10-CM | POA: Diagnosis not present

## 2021-09-02 DIAGNOSIS — Z006 Encounter for examination for normal comparison and control in clinical research program: Secondary | ICD-10-CM | POA: Diagnosis not present

## 2021-09-02 DIAGNOSIS — C3492 Malignant neoplasm of unspecified part of left bronchus or lung: Secondary | ICD-10-CM | POA: Diagnosis not present

## 2021-09-05 ENCOUNTER — Other Ambulatory Visit: Payer: Self-pay

## 2021-09-08 DIAGNOSIS — J90 Pleural effusion, not elsewhere classified: Secondary | ICD-10-CM | POA: Diagnosis not present

## 2021-09-12 ENCOUNTER — Other Ambulatory Visit: Payer: Self-pay

## 2021-09-14 DIAGNOSIS — C3492 Malignant neoplasm of unspecified part of left bronchus or lung: Secondary | ICD-10-CM | POA: Diagnosis not present

## 2021-09-15 ENCOUNTER — Telehealth: Payer: Self-pay | Admitting: Internal Medicine

## 2021-09-15 DIAGNOSIS — C3492 Malignant neoplasm of unspecified part of left bronchus or lung: Secondary | ICD-10-CM | POA: Diagnosis not present

## 2021-09-15 NOTE — Telephone Encounter (Signed)
Per 8/10 phone line pt called to cancel appointments because she is seeing a new dr.  Thomasene Lot canceled per MD

## 2021-09-16 DIAGNOSIS — J90 Pleural effusion, not elsewhere classified: Secondary | ICD-10-CM | POA: Diagnosis not present

## 2021-09-16 DIAGNOSIS — J9601 Acute respiratory failure with hypoxia: Secondary | ICD-10-CM | POA: Diagnosis not present

## 2021-09-16 DIAGNOSIS — J9811 Atelectasis: Secondary | ICD-10-CM | POA: Diagnosis not present

## 2021-09-16 DIAGNOSIS — C3492 Malignant neoplasm of unspecified part of left bronchus or lung: Secondary | ICD-10-CM | POA: Diagnosis not present

## 2021-09-19 ENCOUNTER — Inpatient Hospital Stay: Payer: Self-pay | Admitting: Internal Medicine

## 2021-09-19 ENCOUNTER — Inpatient Hospital Stay: Payer: Self-pay

## 2021-09-19 DIAGNOSIS — C3492 Malignant neoplasm of unspecified part of left bronchus or lung: Secondary | ICD-10-CM | POA: Diagnosis not present

## 2021-09-20 ENCOUNTER — Encounter: Payer: Self-pay | Admitting: Internal Medicine

## 2021-09-20 DIAGNOSIS — J90 Pleural effusion, not elsewhere classified: Secondary | ICD-10-CM | POA: Diagnosis not present

## 2021-09-23 DIAGNOSIS — C3492 Malignant neoplasm of unspecified part of left bronchus or lung: Secondary | ICD-10-CM | POA: Diagnosis not present

## 2021-09-26 ENCOUNTER — Other Ambulatory Visit: Payer: Self-pay

## 2021-09-27 DIAGNOSIS — J90 Pleural effusion, not elsewhere classified: Secondary | ICD-10-CM | POA: Diagnosis not present

## 2021-09-27 DIAGNOSIS — R918 Other nonspecific abnormal finding of lung field: Secondary | ICD-10-CM | POA: Diagnosis not present

## 2021-09-29 DIAGNOSIS — J910 Malignant pleural effusion: Secondary | ICD-10-CM | POA: Diagnosis not present

## 2021-09-29 DIAGNOSIS — D84822 Immunodeficiency due to external causes: Secondary | ICD-10-CM | POA: Diagnosis not present

## 2021-09-29 DIAGNOSIS — Z5111 Encounter for antineoplastic chemotherapy: Secondary | ICD-10-CM | POA: Diagnosis not present

## 2021-09-29 DIAGNOSIS — C3492 Malignant neoplasm of unspecified part of left bronchus or lung: Secondary | ICD-10-CM | POA: Diagnosis not present

## 2021-10-03 ENCOUNTER — Other Ambulatory Visit: Payer: Self-pay

## 2021-10-11 ENCOUNTER — Other Ambulatory Visit: Payer: Self-pay

## 2021-10-11 DIAGNOSIS — Z006 Encounter for examination for normal comparison and control in clinical research program: Secondary | ICD-10-CM | POA: Diagnosis not present

## 2021-10-11 DIAGNOSIS — C3492 Malignant neoplasm of unspecified part of left bronchus or lung: Secondary | ICD-10-CM | POA: Diagnosis not present

## 2021-10-17 ENCOUNTER — Ambulatory Visit: Payer: Self-pay

## 2021-10-17 ENCOUNTER — Ambulatory Visit: Payer: Self-pay | Admitting: Internal Medicine

## 2021-10-17 ENCOUNTER — Other Ambulatory Visit: Payer: Self-pay

## 2021-10-24 ENCOUNTER — Other Ambulatory Visit: Payer: Self-pay

## 2021-10-27 ENCOUNTER — Other Ambulatory Visit: Payer: Self-pay

## 2021-10-27 DIAGNOSIS — Z09 Encounter for follow-up examination after completed treatment for conditions other than malignant neoplasm: Secondary | ICD-10-CM | POA: Diagnosis not present

## 2021-11-01 ENCOUNTER — Encounter: Payer: Self-pay | Admitting: Internal Medicine

## 2021-11-08 DIAGNOSIS — C3492 Malignant neoplasm of unspecified part of left bronchus or lung: Secondary | ICD-10-CM | POA: Diagnosis not present

## 2021-11-08 DIAGNOSIS — C772 Secondary and unspecified malignant neoplasm of intra-abdominal lymph nodes: Secondary | ICD-10-CM | POA: Diagnosis not present

## 2021-11-08 DIAGNOSIS — Z85118 Personal history of other malignant neoplasm of bronchus and lung: Secondary | ICD-10-CM | POA: Diagnosis not present

## 2021-11-08 DIAGNOSIS — Z006 Encounter for examination for normal comparison and control in clinical research program: Secondary | ICD-10-CM | POA: Diagnosis not present

## 2021-11-08 DIAGNOSIS — C7951 Secondary malignant neoplasm of bone: Secondary | ICD-10-CM | POA: Diagnosis not present

## 2021-11-10 ENCOUNTER — Encounter: Payer: Self-pay | Admitting: Internal Medicine

## 2021-11-10 ENCOUNTER — Other Ambulatory Visit (HOSPITAL_COMMUNITY): Payer: Self-pay

## 2021-11-16 DIAGNOSIS — C3492 Malignant neoplasm of unspecified part of left bronchus or lung: Secondary | ICD-10-CM | POA: Diagnosis not present

## 2021-11-16 DIAGNOSIS — J9811 Atelectasis: Secondary | ICD-10-CM | POA: Diagnosis not present

## 2021-11-16 DIAGNOSIS — J939 Pneumothorax, unspecified: Secondary | ICD-10-CM | POA: Diagnosis not present

## 2021-11-16 DIAGNOSIS — J90 Pleural effusion, not elsewhere classified: Secondary | ICD-10-CM | POA: Diagnosis not present

## 2021-11-17 DIAGNOSIS — C3492 Malignant neoplasm of unspecified part of left bronchus or lung: Secondary | ICD-10-CM | POA: Diagnosis not present

## 2021-11-17 DIAGNOSIS — Z006 Encounter for examination for normal comparison and control in clinical research program: Secondary | ICD-10-CM | POA: Diagnosis not present

## 2021-12-06 DIAGNOSIS — Z006 Encounter for examination for normal comparison and control in clinical research program: Secondary | ICD-10-CM | POA: Diagnosis not present

## 2021-12-06 DIAGNOSIS — C3492 Malignant neoplasm of unspecified part of left bronchus or lung: Secondary | ICD-10-CM | POA: Diagnosis not present

## 2021-12-10 ENCOUNTER — Other Ambulatory Visit: Payer: Self-pay | Admitting: Nurse Practitioner

## 2021-12-10 DIAGNOSIS — I10 Essential (primary) hypertension: Secondary | ICD-10-CM

## 2021-12-21 ENCOUNTER — Other Ambulatory Visit: Payer: Self-pay

## 2021-12-21 NOTE — Telephone Encounter (Signed)
Hydrochlorothiazide   APPT on file

## 2021-12-22 ENCOUNTER — Other Ambulatory Visit: Payer: Self-pay

## 2021-12-23 DIAGNOSIS — R918 Other nonspecific abnormal finding of lung field: Secondary | ICD-10-CM | POA: Diagnosis not present

## 2021-12-23 DIAGNOSIS — J939 Pneumothorax, unspecified: Secondary | ICD-10-CM | POA: Diagnosis not present

## 2021-12-23 DIAGNOSIS — J90 Pleural effusion, not elsewhere classified: Secondary | ICD-10-CM | POA: Diagnosis not present

## 2021-12-26 DIAGNOSIS — C3490 Malignant neoplasm of unspecified part of unspecified bronchus or lung: Secondary | ICD-10-CM | POA: Diagnosis not present

## 2021-12-26 DIAGNOSIS — Z006 Encounter for examination for normal comparison and control in clinical research program: Secondary | ICD-10-CM | POA: Diagnosis not present

## 2021-12-26 DIAGNOSIS — C3492 Malignant neoplasm of unspecified part of left bronchus or lung: Secondary | ICD-10-CM | POA: Diagnosis not present

## 2021-12-26 DIAGNOSIS — K760 Fatty (change of) liver, not elsewhere classified: Secondary | ICD-10-CM | POA: Diagnosis not present

## 2022-01-12 ENCOUNTER — Encounter: Payer: Self-pay | Admitting: Internal Medicine

## 2022-01-17 DIAGNOSIS — Z006 Encounter for examination for normal comparison and control in clinical research program: Secondary | ICD-10-CM | POA: Diagnosis not present

## 2022-01-17 DIAGNOSIS — C3490 Malignant neoplasm of unspecified part of unspecified bronchus or lung: Secondary | ICD-10-CM | POA: Diagnosis not present

## 2022-01-19 ENCOUNTER — Ambulatory Visit: Payer: Self-pay | Admitting: Nurse Practitioner

## 2022-01-30 DIAGNOSIS — H5213 Myopia, bilateral: Secondary | ICD-10-CM | POA: Diagnosis not present

## 2022-02-02 ENCOUNTER — Other Ambulatory Visit: Payer: Self-pay

## 2022-02-10 DIAGNOSIS — J90 Pleural effusion, not elsewhere classified: Secondary | ICD-10-CM | POA: Diagnosis not present

## 2022-02-10 DIAGNOSIS — Z85118 Personal history of other malignant neoplasm of bronchus and lung: Secondary | ICD-10-CM | POA: Diagnosis not present

## 2022-02-10 DIAGNOSIS — C3492 Malignant neoplasm of unspecified part of left bronchus or lung: Secondary | ICD-10-CM | POA: Diagnosis not present

## 2022-02-10 DIAGNOSIS — Z006 Encounter for examination for normal comparison and control in clinical research program: Secondary | ICD-10-CM | POA: Diagnosis not present

## 2022-02-23 DIAGNOSIS — I10 Essential (primary) hypertension: Secondary | ICD-10-CM

## 2022-02-27 DIAGNOSIS — R0602 Shortness of breath: Secondary | ICD-10-CM | POA: Diagnosis not present

## 2022-02-27 DIAGNOSIS — Z006 Encounter for examination for normal comparison and control in clinical research program: Secondary | ICD-10-CM | POA: Diagnosis not present

## 2022-03-06 DIAGNOSIS — Z006 Encounter for examination for normal comparison and control in clinical research program: Secondary | ICD-10-CM | POA: Diagnosis not present

## 2022-03-06 DIAGNOSIS — C3490 Malignant neoplasm of unspecified part of unspecified bronchus or lung: Secondary | ICD-10-CM | POA: Diagnosis not present

## 2022-03-10 MED ORDER — HYDROCHLOROTHIAZIDE 12.5 MG PO CAPS: 12.5000 mg | ORAL_CAPSULE | Freq: Every day | ORAL | 0 refills | Status: DC

## 2022-03-10 NOTE — Telephone Encounter (Signed)
Caller & Relationship to patient:  MRN #  [id number]   Call Back Number:   Date of Last Office Visit: 12/10/2021     Date of Next Office Visit: 03/20/2022    Medication(s) to be Refilled: Water pills   Preferred Pharmacy:   ** Please notify patient to allow 48-72 hours to process** **Let patient know to contact pharmacy at the end of the day to make sure medication is ready. ** **If patient has not been seen in a year or longer, book an appointment **Advise to use MyChart for refill requests OR to contact their pharmacy

## 2022-03-12 ENCOUNTER — Other Ambulatory Visit: Payer: Self-pay

## 2022-03-20 ENCOUNTER — Ambulatory Visit: Payer: Self-pay | Admitting: Nurse Practitioner

## 2022-03-20 ENCOUNTER — Other Ambulatory Visit: Payer: Self-pay | Admitting: Nurse Practitioner

## 2022-03-20 MED ORDER — FUROSEMIDE 20 MG PO TABS: 20.0000 mg | ORAL_TABLET | Freq: Every day | ORAL | 0 refills | Status: DC | PRN

## 2022-03-20 NOTE — Telephone Encounter (Signed)
I ordered a refill on lasix. Looks like patient is overdue for a follow up visit. Please call her to schedule an appointment if she is not already scheduled. Thanks.

## 2022-03-21 DIAGNOSIS — R0602 Shortness of breath: Secondary | ICD-10-CM | POA: Diagnosis not present

## 2022-03-21 DIAGNOSIS — J9601 Acute respiratory failure with hypoxia: Secondary | ICD-10-CM | POA: Diagnosis not present

## 2022-03-22 ENCOUNTER — Ambulatory Visit (INDEPENDENT_AMBULATORY_CARE_PROVIDER_SITE_OTHER): Payer: Medicaid Other | Admitting: Nurse Practitioner

## 2022-03-22 ENCOUNTER — Encounter: Payer: Self-pay | Admitting: Nurse Practitioner

## 2022-03-22 VITALS — BP 126/76 | HR 96 | Temp 97.3°F | Ht 63.0 in | Wt 272.8 lb

## 2022-03-22 DIAGNOSIS — I10 Essential (primary) hypertension: Secondary | ICD-10-CM

## 2022-03-22 DIAGNOSIS — Z1283 Encounter for screening for malignant neoplasm of skin: Secondary | ICD-10-CM

## 2022-03-22 DIAGNOSIS — Z1211 Encounter for screening for malignant neoplasm of colon: Secondary | ICD-10-CM

## 2022-03-22 DIAGNOSIS — Z1231 Encounter for screening mammogram for malignant neoplasm of breast: Secondary | ICD-10-CM

## 2022-03-22 MED ORDER — HYDROCHLOROTHIAZIDE 12.5 MG PO CAPS: 12.5000 mg | ORAL_CAPSULE | Freq: Every day | ORAL | 0 refills | Status: AC

## 2022-03-22 NOTE — Progress Notes (Signed)
@Patient  ID: Patricia Pena, female    DOB: [birth year], 57 y.o.   MRN: [id number]  Chief Complaint  Patient presents with   Shortness of Breath    Referring provider: [name], NP   HPI  Patient presents today for follow-up on hypertension.  Patient does have lung cancer and is being followed by [facility] for this.  She is in clinical trials.  Patient is due for mammogram and Cologuard.  Patient would like referral to dermatology for skin cancer screening. Denies f/c/s, n/v/d, hemoptysis, PND, leg swelling Denies chest pain or edema      Allergies  Allergen Reactions   Codeine Other (See Comments)    headache    Immunization History  Administered Date(s) Administered   Hepatitis B 03/11/2013   Hepatitis B, ADULT 03/11/2013   Moderna Sars-Covid-2 Vaccination 12/17/2019   PFIZER Comirnaty(Gray Top)Covid-19 Tri-Sucrose Vaccine 07/03/2020   PFIZER(Purple Top)SARS-COV-2 Vaccination 04/16/2019, 05/07/2019   PPD Test 03/09/2013, 03/10/2013   Tdap 04/03/2011    Past Medical History:  Diagnosis Date   Arthritis    Cancer ([place])    Hypertension    Incisional hernia without mention of obstruction or gangrene    Morbid obesity ([place])    Seizures ([place])    last one was in 1985    Tobacco History: Social History   Tobacco Use  Smoking Status Former   Packs/day: 1.50   Years: 35.00   Total pack years: 52.50   Types: Cigarettes   Quit date: 12/28/2009   Years since quitting: 12.2  Smokeless Tobacco Never   Counseling given: Not Answered   Outpatient Encounter Medications as of 03/22/2022  Medication Sig   CALCIUM-VITAMIN D PO Take 2,000 mg by mouth daily.   cholecalciferol (VITAMIN D) 1000 units tablet Take 1,000 Units by mouth daily.   loperamide (IMODIUM) 2 MG capsule Take 2 mg by mouth as needed for diarrhea or loose stools. Take 2 capsules  after first diarrhea stool, then take one capsule after each subsequent stool up to 6-8/day   magnesium 30  MG tablet Take 30 mg by mouth 2 (two) times daily.   ondansetron (ZOFRAN) 8 MG tablet Take 1 tablet (8 mg total) by mouth every 8 (eight) hours as needed for nausea or vomiting.   potassium chloride SA (KLOR-CON M) 20 MEQ tablet Take 2 tablets (40 mEq total) by mouth daily.   prochlorperazine (COMPAZINE) 10 MG tablet Take 1 tablet (10 mg total) by mouth every 6 (six) hours as needed.   [DISCONTINUED] hydrochlorothiazide (MICROZIDE) 12.5 MG capsule Take 1 capsule (12.5 mg total) by mouth daily.   Cyanocobalamin (VITAMIN B-12 PO) Take 1 tablet by mouth daily. (Patient not taking: Reported on 06/20/2021)   Desoximetasone 0.05 % OINT Apply 1 application. topically daily as needed (For skin rash).   furosemide (LASIX) 20 MG tablet Take 1 tablet (20 mg total) by mouth daily as needed (weight gain of 3lbs in 1 day or 5lbs in 1 week). (Patient not taking: Reported on 03/22/2022)   hydrochlorothiazide (MICROZIDE) 12.5 MG capsule Take 1 capsule (12.5 mg total) by mouth daily.   hydrOXYzine (ATARAX) 10 MG tablet Take 1 tablet (10 mg total) by mouth 3 (three) times daily as needed. (Patient not taking: Reported on 03/22/2022)   lidocaine-prilocaine (EMLA) cream Apply to the Port-A-Cath site 30 minutes before chemotherapy (Patient not taking: Reported on 06/20/2021)   LORazepam (ATIVAN) 1 MG tablet 1 tablet 30 minutes before MRI of the brain. (Patient  not taking: Reported on 06/20/2021)   No facility-administered encounter medications on file as of 03/22/2022.     Review of Systems  Review of Systems  Constitutional: Negative.   HENT: Negative.    Cardiovascular: Negative.   Gastrointestinal: Negative.   Allergic/Immunologic: Negative.   Neurological: Negative.   Psychiatric/Behavioral: Negative.         Physical Exam  BP 126/76   Pulse 96   Temp (!) 97.3 F (36.3 C)   Ht 5\' 3"  (1.6 m)   Wt 272 lb 12.8 oz (123.7 kg)   SpO2 98%   BMI 48.32 kg/m   Wt Readings from Last 5 Encounters:  03/22/22  272 lb 12.8 oz (123.7 kg)  08/05/21 272 lb 12.8 oz (123.7 kg)  08/04/21 275 lb (124.7 kg)  08/03/21 275 lb 1.9 oz (124.8 kg)  07/13/21 282 lb 12 oz (128.3 kg)     Physical Exam Vitals and nursing note reviewed.  Constitutional:      General: She is not in acute distress.    Appearance: She is well-developed.  Cardiovascular:     Rate and Rhythm: Normal rate and regular rhythm.  Pulmonary:     Effort: Pulmonary effort is normal.     Breath sounds: Normal breath sounds.  Neurological:     Mental Status: She is alert and oriented to person, place, and time.      Lab Results:  CBC    Component Value Date/Time   WBC 2.1 (L) 08/15/2021 1329   WBC 6.7 08/04/2021 1651   RBC 2.88 (L) 08/15/2021 1329   HGB 9.7 (L) 08/15/2021 1329   HCT 28.5 (L) 08/15/2021 1329   PLT 161 08/15/2021 1329   MCV 99.0 08/15/2021 1329   MCH 33.7 08/15/2021 1329   MCHC 34.0 08/15/2021 1329   RDW 15.9 (H) 08/15/2021 1329   LYMPHSABS 1.0 08/15/2021 1329   MONOABS 0.5 08/15/2021 1329   EOSABS 0.0 08/15/2021 1329   BASOSABS 0.0 08/15/2021 1329    BMET    Component Value Date/Time   NA 139 08/15/2021 1329   NA 143 05/21/2017 1101   K 2.9 (L) 08/15/2021 1329   CL 102 08/15/2021 1329   CO2 30 08/15/2021 1329   GLUCOSE 109 (H) 08/15/2021 1329   BUN 8 08/15/2021 1329   BUN 13 05/21/2017 1101   CREATININE 0.71 08/15/2021 1329   CREATININE 0.78 08/02/2015 1944   CALCIUM 9.5 08/15/2021 1329   GFRNONAA >60 08/15/2021 1329   GFRAA 92 05/21/2017 1101    BNP    Component Value Date/Time   BNP 130.5 (H) 08/04/2021 1651     Assessment & Plan:   Encounter for screening mammogram for malignant neoplasm of breast - MM Digital Screening   2. Colon cancer screening  - Cologuard   3. Essential hypertension  - hydrochlorothiazide (MICROZIDE) 12.5 MG capsule; Take 1 capsule (12.5 mg total) by mouth daily.  Dispense: 90 capsule; Refill: 0   4. Skin cancer screening  - Ambulatory referral  to Dermatology    Follow up:  Follow up in 6 months     [name], NP 03/24/2022

## 2022-03-22 NOTE — Patient Instructions (Addendum)
1. Encounter for screening mammogram for malignant neoplasm of breast  - MM Digital Screening   2. Colon cancer screening  - Cologuard   3. Essential hypertension  - hydrochlorothiazide (MICROZIDE) 12.5 MG capsule; Take 1 capsule (12.5 mg total) by mouth daily.  Dispense: 90 capsule; Refill: 0   4. Skin cancer screening  - Ambulatory referral to Dermatology    Follow up:  Follow up in 6 months

## 2022-03-23 ENCOUNTER — Other Ambulatory Visit: Payer: Self-pay

## 2022-03-24 ENCOUNTER — Encounter: Payer: Self-pay | Admitting: Nurse Practitioner

## 2022-03-24 DIAGNOSIS — Z1231 Encounter for screening mammogram for malignant neoplasm of breast: Secondary | ICD-10-CM | POA: Insufficient documentation

## 2022-03-24 NOTE — Assessment & Plan Note (Signed)
-   MM Digital Screening   2. Colon cancer screening  - Cologuard   3. Essential hypertension  - hydrochlorothiazide (MICROZIDE) 12.5 MG capsule; Take 1 capsule (12.5 mg total) by mouth daily.  Dispense: 90 capsule; Refill: 0   4. Skin cancer screening  - Ambulatory referral to Dermatology    Follow up:  Follow up in 6 months

## 2022-03-27 DIAGNOSIS — C3492 Malignant neoplasm of unspecified part of left bronchus or lung: Secondary | ICD-10-CM | POA: Diagnosis not present

## 2022-03-27 DIAGNOSIS — C3412 Malignant neoplasm of upper lobe, left bronchus or lung: Secondary | ICD-10-CM | POA: Diagnosis not present

## 2022-03-27 DIAGNOSIS — J90 Pleural effusion, not elsewhere classified: Secondary | ICD-10-CM | POA: Diagnosis not present

## 2022-03-27 DIAGNOSIS — Z006 Encounter for examination for normal comparison and control in clinical research program: Secondary | ICD-10-CM | POA: Diagnosis not present

## 2022-04-17 DIAGNOSIS — C3490 Malignant neoplasm of unspecified part of unspecified bronchus or lung: Secondary | ICD-10-CM | POA: Diagnosis not present

## 2022-04-17 DIAGNOSIS — I340 Nonrheumatic mitral (valve) insufficiency: Secondary | ICD-10-CM | POA: Diagnosis not present

## 2022-04-17 DIAGNOSIS — Z006 Encounter for examination for normal comparison and control in clinical research program: Secondary | ICD-10-CM | POA: Diagnosis not present

## 2022-04-17 DIAGNOSIS — Q248 Other specified congenital malformations of heart: Secondary | ICD-10-CM | POA: Diagnosis not present

## 2022-04-18 DIAGNOSIS — I5020 Unspecified systolic (congestive) heart failure: Secondary | ICD-10-CM | POA: Diagnosis not present

## 2022-04-18 DIAGNOSIS — C3492 Malignant neoplasm of unspecified part of left bronchus or lung: Secondary | ICD-10-CM | POA: Diagnosis not present

## 2022-05-02 DIAGNOSIS — C782 Secondary malignant neoplasm of pleura: Secondary | ICD-10-CM | POA: Diagnosis not present

## 2022-05-02 DIAGNOSIS — C7951 Secondary malignant neoplasm of bone: Secondary | ICD-10-CM | POA: Diagnosis not present

## 2022-05-02 DIAGNOSIS — C3492 Malignant neoplasm of unspecified part of left bronchus or lung: Secondary | ICD-10-CM | POA: Diagnosis not present

## 2022-05-04 DIAGNOSIS — Z006 Encounter for examination for normal comparison and control in clinical research program: Secondary | ICD-10-CM | POA: Diagnosis not present

## 2022-05-04 DIAGNOSIS — C3492 Malignant neoplasm of unspecified part of left bronchus or lung: Secondary | ICD-10-CM | POA: Diagnosis not present

## 2022-05-08 ENCOUNTER — Ambulatory Visit: Payer: Medicaid Other

## 2022-05-10 DIAGNOSIS — C3492 Malignant neoplasm of unspecified part of left bronchus or lung: Secondary | ICD-10-CM | POA: Diagnosis not present

## 2022-05-10 DIAGNOSIS — I10 Essential (primary) hypertension: Secondary | ICD-10-CM | POA: Diagnosis not present

## 2022-05-10 DIAGNOSIS — I5020 Unspecified systolic (congestive) heart failure: Secondary | ICD-10-CM | POA: Diagnosis not present

## 2022-05-10 DIAGNOSIS — I493 Ventricular premature depolarization: Secondary | ICD-10-CM | POA: Diagnosis not present

## 2022-05-30 DIAGNOSIS — Z6841 Body Mass Index (BMI) 40.0 and over, adult: Secondary | ICD-10-CM | POA: Diagnosis not present

## 2022-05-30 DIAGNOSIS — I493 Ventricular premature depolarization: Secondary | ICD-10-CM | POA: Diagnosis not present

## 2022-05-30 DIAGNOSIS — C3492 Malignant neoplasm of unspecified part of left bronchus or lung: Secondary | ICD-10-CM | POA: Diagnosis not present

## 2022-05-30 DIAGNOSIS — Z006 Encounter for examination for normal comparison and control in clinical research program: Secondary | ICD-10-CM | POA: Diagnosis not present

## 2022-05-31 DIAGNOSIS — I493 Ventricular premature depolarization: Secondary | ICD-10-CM | POA: Diagnosis not present

## 2022-06-02 ENCOUNTER — Ambulatory Visit: Payer: Medicaid Other

## 2022-06-05 DIAGNOSIS — I2510 Atherosclerotic heart disease of native coronary artery without angina pectoris: Secondary | ICD-10-CM | POA: Diagnosis not present

## 2022-06-07 DIAGNOSIS — I10 Essential (primary) hypertension: Secondary | ICD-10-CM | POA: Diagnosis not present

## 2022-06-07 DIAGNOSIS — I493 Ventricular premature depolarization: Secondary | ICD-10-CM | POA: Diagnosis not present

## 2022-06-07 DIAGNOSIS — I5022 Chronic systolic (congestive) heart failure: Secondary | ICD-10-CM | POA: Diagnosis not present

## 2022-06-08 DIAGNOSIS — I10 Essential (primary) hypertension: Secondary | ICD-10-CM | POA: Diagnosis not present

## 2022-06-08 DIAGNOSIS — C3492 Malignant neoplasm of unspecified part of left bronchus or lung: Secondary | ICD-10-CM | POA: Diagnosis not present

## 2022-06-08 DIAGNOSIS — I5020 Unspecified systolic (congestive) heart failure: Secondary | ICD-10-CM | POA: Diagnosis not present

## 2022-06-19 DIAGNOSIS — Z006 Encounter for examination for normal comparison and control in clinical research program: Secondary | ICD-10-CM | POA: Diagnosis not present

## 2022-06-19 DIAGNOSIS — C3492 Malignant neoplasm of unspecified part of left bronchus or lung: Secondary | ICD-10-CM | POA: Diagnosis not present

## 2022-07-12 DIAGNOSIS — C3492 Malignant neoplasm of unspecified part of left bronchus or lung: Secondary | ICD-10-CM | POA: Diagnosis not present

## 2022-07-12 DIAGNOSIS — Z006 Encounter for examination for normal comparison and control in clinical research program: Secondary | ICD-10-CM | POA: Diagnosis not present

## 2022-07-19 DIAGNOSIS — I5189 Other ill-defined heart diseases: Secondary | ICD-10-CM | POA: Diagnosis not present

## 2022-07-28 DIAGNOSIS — Z006 Encounter for examination for normal comparison and control in clinical research program: Secondary | ICD-10-CM | POA: Diagnosis not present

## 2022-07-28 DIAGNOSIS — Z85118 Personal history of other malignant neoplasm of bronchus and lung: Secondary | ICD-10-CM | POA: Diagnosis not present

## 2022-07-28 DIAGNOSIS — C3492 Malignant neoplasm of unspecified part of left bronchus or lung: Secondary | ICD-10-CM | POA: Diagnosis not present

## 2022-08-03 DIAGNOSIS — Z006 Encounter for examination for normal comparison and control in clinical research program: Secondary | ICD-10-CM | POA: Diagnosis not present

## 2022-08-03 DIAGNOSIS — C3492 Malignant neoplasm of unspecified part of left bronchus or lung: Secondary | ICD-10-CM | POA: Diagnosis not present

## 2022-08-09 DIAGNOSIS — C3492 Malignant neoplasm of unspecified part of left bronchus or lung: Secondary | ICD-10-CM | POA: Diagnosis not present

## 2022-09-19 DIAGNOSIS — C3490 Malignant neoplasm of unspecified part of unspecified bronchus or lung: Secondary | ICD-10-CM | POA: Diagnosis not present

## 2022-09-19 DIAGNOSIS — C7931 Secondary malignant neoplasm of brain: Secondary | ICD-10-CM | POA: Diagnosis not present

## 2022-09-20 ENCOUNTER — Ambulatory Visit: Payer: Self-pay | Admitting: Nurse Practitioner

## 2022-10-14 DIAGNOSIS — C3492 Malignant neoplasm of unspecified part of left bronchus or lung: Secondary | ICD-10-CM | POA: Diagnosis not present

## 2022-10-14 DIAGNOSIS — Z006 Encounter for examination for normal comparison and control in clinical research program: Secondary | ICD-10-CM | POA: Diagnosis not present

## 2022-11-09 ENCOUNTER — Other Ambulatory Visit: Payer: Self-pay | Admitting: Physician Assistant

## 2022-11-17 DIAGNOSIS — D3502 Benign neoplasm of left adrenal gland: Secondary | ICD-10-CM | POA: Diagnosis not present

## 2022-11-17 DIAGNOSIS — C3492 Malignant neoplasm of unspecified part of left bronchus or lung: Secondary | ICD-10-CM | POA: Diagnosis not present

## 2022-11-17 DIAGNOSIS — R590 Localized enlarged lymph nodes: Secondary | ICD-10-CM | POA: Diagnosis not present

## 2022-11-17 DIAGNOSIS — C782 Secondary malignant neoplasm of pleura: Secondary | ICD-10-CM | POA: Diagnosis not present

## 2022-11-20 DIAGNOSIS — C3492 Malignant neoplasm of unspecified part of left bronchus or lung: Secondary | ICD-10-CM | POA: Diagnosis not present

## 2022-11-23 DIAGNOSIS — I10 Essential (primary) hypertension: Secondary | ICD-10-CM | POA: Diagnosis not present

## 2022-11-23 DIAGNOSIS — C3490 Malignant neoplasm of unspecified part of unspecified bronchus or lung: Secondary | ICD-10-CM | POA: Diagnosis not present

## 2022-11-23 DIAGNOSIS — I5189 Other ill-defined heart diseases: Secondary | ICD-10-CM | POA: Diagnosis not present

## 2022-11-23 DIAGNOSIS — M1990 Unspecified osteoarthritis, unspecified site: Secondary | ICD-10-CM | POA: Diagnosis not present

## 2022-12-24 DIAGNOSIS — M1990 Unspecified osteoarthritis, unspecified site: Secondary | ICD-10-CM | POA: Diagnosis not present

## 2022-12-24 DIAGNOSIS — C3490 Malignant neoplasm of unspecified part of unspecified bronchus or lung: Secondary | ICD-10-CM | POA: Diagnosis not present

## 2022-12-24 DIAGNOSIS — I10 Essential (primary) hypertension: Secondary | ICD-10-CM | POA: Diagnosis not present

## 2022-12-24 DIAGNOSIS — I5189 Other ill-defined heart diseases: Secondary | ICD-10-CM | POA: Diagnosis not present

## 2023-01-01 DIAGNOSIS — C3491 Malignant neoplasm of unspecified part of right bronchus or lung: Secondary | ICD-10-CM | POA: Diagnosis not present

## 2023-01-17 DIAGNOSIS — I519 Heart disease, unspecified: Secondary | ICD-10-CM | POA: Diagnosis not present

## 2023-01-17 DIAGNOSIS — I517 Cardiomegaly: Secondary | ICD-10-CM | POA: Diagnosis not present

## 2023-01-17 DIAGNOSIS — C7931 Secondary malignant neoplasm of brain: Secondary | ICD-10-CM | POA: Diagnosis not present

## 2023-01-17 DIAGNOSIS — C3492 Malignant neoplasm of unspecified part of left bronchus or lung: Secondary | ICD-10-CM | POA: Diagnosis not present

## 2023-01-17 DIAGNOSIS — I350 Nonrheumatic aortic (valve) stenosis: Secondary | ICD-10-CM | POA: Diagnosis not present

## 2023-01-23 DIAGNOSIS — I5189 Other ill-defined heart diseases: Secondary | ICD-10-CM | POA: Diagnosis not present

## 2023-01-23 DIAGNOSIS — M1990 Unspecified osteoarthritis, unspecified site: Secondary | ICD-10-CM | POA: Diagnosis not present

## 2023-01-23 DIAGNOSIS — C3490 Malignant neoplasm of unspecified part of unspecified bronchus or lung: Secondary | ICD-10-CM | POA: Diagnosis not present

## 2023-01-23 DIAGNOSIS — I10 Essential (primary) hypertension: Secondary | ICD-10-CM | POA: Diagnosis not present

## 2023-01-24 ENCOUNTER — Ambulatory Visit (INDEPENDENT_AMBULATORY_CARE_PROVIDER_SITE_OTHER): Payer: Self-pay | Admitting: Nurse Practitioner

## 2023-01-24 ENCOUNTER — Encounter: Payer: Self-pay | Admitting: Nurse Practitioner

## 2023-01-24 VITALS — BP 104/63 | HR 92 | Temp 97.2°F | Wt 287.0 lb

## 2023-01-24 DIAGNOSIS — J040 Acute laryngitis: Secondary | ICD-10-CM | POA: Diagnosis not present

## 2023-01-24 MED ORDER — GUAIFENESIN ER 600 MG PO TB12: 600.0000 mg | ORAL_TABLET | Freq: Two times a day (BID) | ORAL | 0 refills | Status: AC

## 2023-01-24 MED ORDER — PREDNISONE 10 MG PO TABS: ORAL_TABLET | ORAL | 0 refills | Status: AC

## 2023-01-24 NOTE — Progress Notes (Signed)
Subjective   Patient ID: Patricia Pena, female    DOB: February 23, 1965, 57 y.o.   MRN: [id number]  Chief Complaint  Patient presents with   Laryngitis    Referring provider: Ivonne Andrew, NP  Patricia Pena is a 57 y.o. female with Past Medical History: No date: Arthritis No date: Cancer ([facility]) No date: Hypertension No date: Incisional hernia without mention of obstruction or gangrene No date: Morbid obesity ([facility]) No date: Seizures ([facility])     Comment:  last one was in 1985   HPI  Patient presents today for an acute visit for laryngitis.  Patient does have a history of lung cancer and is being followed by [facility] lung [facility] for this. Started about 1 week ago. No sore throat and no fevers. Will trial prednisone and mucinex. Denies f/c/s, n/v/d, hemoptysis, PND, leg swelling. Denies chest pain or edema.      Allergies  Allergen Reactions   Codeine Other (See Comments)    headache    Immunization History  Administered Date(s) Administered   Hepatitis B 03/11/2013   Hepatitis B, ADULT 03/11/2013   Moderna Sars-Covid-2 Vaccination 12/17/2019   PFIZER Comirnaty(Gray Top)Covid-19 Tri-Sucrose Vaccine 07/03/2020   PFIZER(Purple Top)SARS-COV-2 Vaccination 04/16/2019, 05/07/2019   PPD Test 03/09/2013, 03/10/2013   Tdap 04/03/2011    Tobacco History: Social History   Tobacco Use  Smoking Status Former   Current packs/day: 0.00   Average packs/day: 1.5 packs/day for 35.0 years (52.5 ttl pk-yrs)   Types: Cigarettes   Start date: 12/29/1974   Quit date: 12/28/2009   Years since quitting: 13.0  Smokeless Tobacco Never   Counseling given: Not Answered   Outpatient Encounter Medications as of 01/24/2023  Medication Sig   CALCIUM-VITAMIN D PO Take 2,000 mg by mouth daily.   cholecalciferol (VITAMIN D) 1000 units tablet Take 1,000 Units by mouth daily.   Desoximetasone 0.05 % OINT Apply 1 application. topically daily as needed (For skin rash).   furosemide (LASIX) 20 MG  tablet Take 1 tablet (20 mg total) by mouth daily as needed (weight gain of 3lbs in 1 day or 5lbs in 1 week).   guaiFENesin (MUCINEX) 600 MG 12 hr tablet Take 1 tablet (600 mg total) by mouth 2 (two) times daily for 7 days.   hydrochlorothiazide (MICROZIDE) 12.5 MG capsule Take 1 capsule (12.5 mg total) by mouth daily.   loperamide (IMODIUM) 2 MG capsule Take 2 mg by mouth as needed for diarrhea or loose stools. Take 2 capsules  after first diarrhea stool, then take one capsule after each subsequent stool up to 6-8/day   LORazepam (ATIVAN) 1 MG tablet 1 tablet 30 minutes before MRI of the brain.   magnesium 30 MG tablet Take 30 mg by mouth 2 (two) times daily.   ondansetron (ZOFRAN) 8 MG tablet Take 1 tablet (8 mg total) by mouth every 8 (eight) hours as needed for nausea or vomiting.   predniSONE (DELTASONE) 10 MG tablet Take 4 tabs for 2 days, then 3 tabs for 2 days, then 2 tabs for 2 days, then 1 tab for 2 days, then stop   Cyanocobalamin (VITAMIN B-12 PO) Take 1 tablet by mouth daily. (Patient not taking: Reported on 06/20/2021)   hydrOXYzine (ATARAX) 10 MG tablet Take 1 tablet (10 mg total) by mouth 3 (three) times daily as needed. (Patient not taking: Reported on 01/24/2023)   lidocaine-prilocaine (EMLA) cream Apply to the Port-A-Cath site 30 minutes before chemotherapy (Patient not taking: Reported on 01/24/2023)  potassium chloride SA (KLOR-CON M) 20 MEQ tablet Take 2 tablets (40 mEq total) by mouth daily. (Patient not taking: Reported on 01/24/2023)   prochlorperazine (COMPAZINE) 10 MG tablet Take 1 tablet (10 mg total) by mouth every 6 (six) hours as needed. (Patient not taking: Reported on 01/24/2023)   No facility-administered encounter medications on file as of 01/24/2023.    Review of Systems  Review of Systems  Constitutional: Negative.   HENT:  Positive for voice change.   Cardiovascular: Negative.   Gastrointestinal: Negative.   Allergic/Immunologic: Negative.    Neurological: Negative.   Psychiatric/Behavioral: Negative.       Objective:   BP 104/63   Pulse 92   Temp (!) 97.2 F (36.2 C)   Wt 287 lb (130.2 kg)   SpO2 96%   BMI 50.84 kg/m   Wt Readings from Last 5 Encounters:  01/24/23 287 lb (130.2 kg)  03/22/22 272 lb 12.8 oz (123.7 kg)  08/05/21 272 lb 12.8 oz (123.7 kg)  08/04/21 275 lb (124.7 kg)  08/03/21 275 lb 1.9 oz (124.8 kg)     Physical Exam Vitals and nursing note reviewed.  Constitutional:      General: She is not in acute distress.    Appearance: She is well-developed.  Cardiovascular:     Rate and Rhythm: Normal rate and regular rhythm.  Pulmonary:     Effort: Pulmonary effort is normal.     Breath sounds: Normal breath sounds.  Neurological:     Mental Status: She is alert and oriented to person, place, and time.       Assessment & Plan:   Laryngitis -     predniSONE; Take 4 tabs for 2 days, then 3 tabs for 2 days, then 2 tabs for 2 days, then 1 tab for 2 days, then stop  Dispense: 20 tablet; Refill: 0 -     guaiFENesin ER; Take 1 tablet (600 mg total) by mouth 2 (two) times daily for 7 days.  Dispense: 14 tablet; Refill: 0     Return if symptoms worsen or fail to improve.   Ivonne Andrew, NP 01/24/2023

## 2023-01-24 NOTE — Patient Instructions (Signed)
1. Laryngitis (Primary)  - predniSONE (DELTASONE) 10 MG tablet; Take 4 tabs for 2 days, then 3 tabs for 2 days, then 2 tabs for 2 days, then 1 tab for 2 days, then stop  Dispense: 20 tablet; Refill: 0 - guaiFENesin (MUCINEX) 600 MG 12 hr tablet; Take 1 tablet (600 mg total) by mouth 2 (two) times daily for 7 days.  Dispense: 14 tablet; Refill: 0   Follow up:  Follow up as scheduled

## 2023-01-25 ENCOUNTER — Telehealth: Payer: Self-pay | Admitting: Medical Oncology

## 2023-01-25 NOTE — Telephone Encounter (Signed)
Dr. Janann August is referring her back to Dr. Arbutus Ped.Jeanene requested appt .  He said to schedule her 12/31 or 01/02. Schedule message sent.

## 2023-01-30 ENCOUNTER — Telehealth: Payer: Self-pay | Admitting: Internal Medicine

## 2023-01-30 NOTE — Telephone Encounter (Signed)
Unable to leave a message with patient due to phone number not being in service; left a message with patient's son in regards to scheduled appointment times/dates

## 2023-02-06 ENCOUNTER — Ambulatory Visit: Payer: Medicaid Other | Admitting: Internal Medicine

## 2023-02-06 ENCOUNTER — Other Ambulatory Visit: Payer: Medicaid Other

## 2023-02-15 ENCOUNTER — Other Ambulatory Visit: Payer: Self-pay | Admitting: Nurse Practitioner

## 2023-02-15 ENCOUNTER — Telehealth: Payer: Self-pay

## 2023-02-15 DIAGNOSIS — Z1231 Encounter for screening mammogram for malignant neoplasm of breast: Secondary | ICD-10-CM

## 2023-02-15 NOTE — Telephone Encounter (Signed)
 Please place order for mammo. She wanted to have done 03/14/23 here. Thanks [employer]

## 2023-02-20 ENCOUNTER — Telehealth: Payer: Self-pay | Admitting: Medical Oncology

## 2023-02-20 NOTE — Telephone Encounter (Signed)
"  I am still at [facility] getting treatment from Dr. Janann August. I go every month for treatment . My next appt is !/21".

## 2023-02-23 DIAGNOSIS — I5189 Other ill-defined heart diseases: Secondary | ICD-10-CM | POA: Diagnosis not present

## 2023-02-23 DIAGNOSIS — I10 Essential (primary) hypertension: Secondary | ICD-10-CM | POA: Diagnosis not present

## 2023-02-23 DIAGNOSIS — M1990 Unspecified osteoarthritis, unspecified site: Secondary | ICD-10-CM | POA: Diagnosis not present

## 2023-02-23 DIAGNOSIS — C3490 Malignant neoplasm of unspecified part of unspecified bronchus or lung: Secondary | ICD-10-CM | POA: Diagnosis not present

## 2023-03-14 ENCOUNTER — Inpatient Hospital Stay: Admission: RE | Admit: 2023-03-14 | Payer: Medicaid Other | Source: Ambulatory Visit

## 2023-03-26 DIAGNOSIS — C3490 Malignant neoplasm of unspecified part of unspecified bronchus or lung: Secondary | ICD-10-CM | POA: Diagnosis not present

## 2023-03-26 DIAGNOSIS — I5189 Other ill-defined heart diseases: Secondary | ICD-10-CM | POA: Diagnosis not present

## 2023-03-26 DIAGNOSIS — M1990 Unspecified osteoarthritis, unspecified site: Secondary | ICD-10-CM | POA: Diagnosis not present

## 2023-03-26 DIAGNOSIS — I10 Essential (primary) hypertension: Secondary | ICD-10-CM | POA: Diagnosis not present

## 2023-03-28 ENCOUNTER — Ambulatory Visit: Payer: Medicaid Other | Admitting: Dermatology

## 2023-04-20 IMAGING — DX DG CHEST 1V
1 series · 1 of 1 positions shown · non-contrast
Comparison: 06/20/2021

CLINICAL DATA: LEFT-sided thoracentesis

EXAM:
CHEST  1 VIEW

[chest pa]
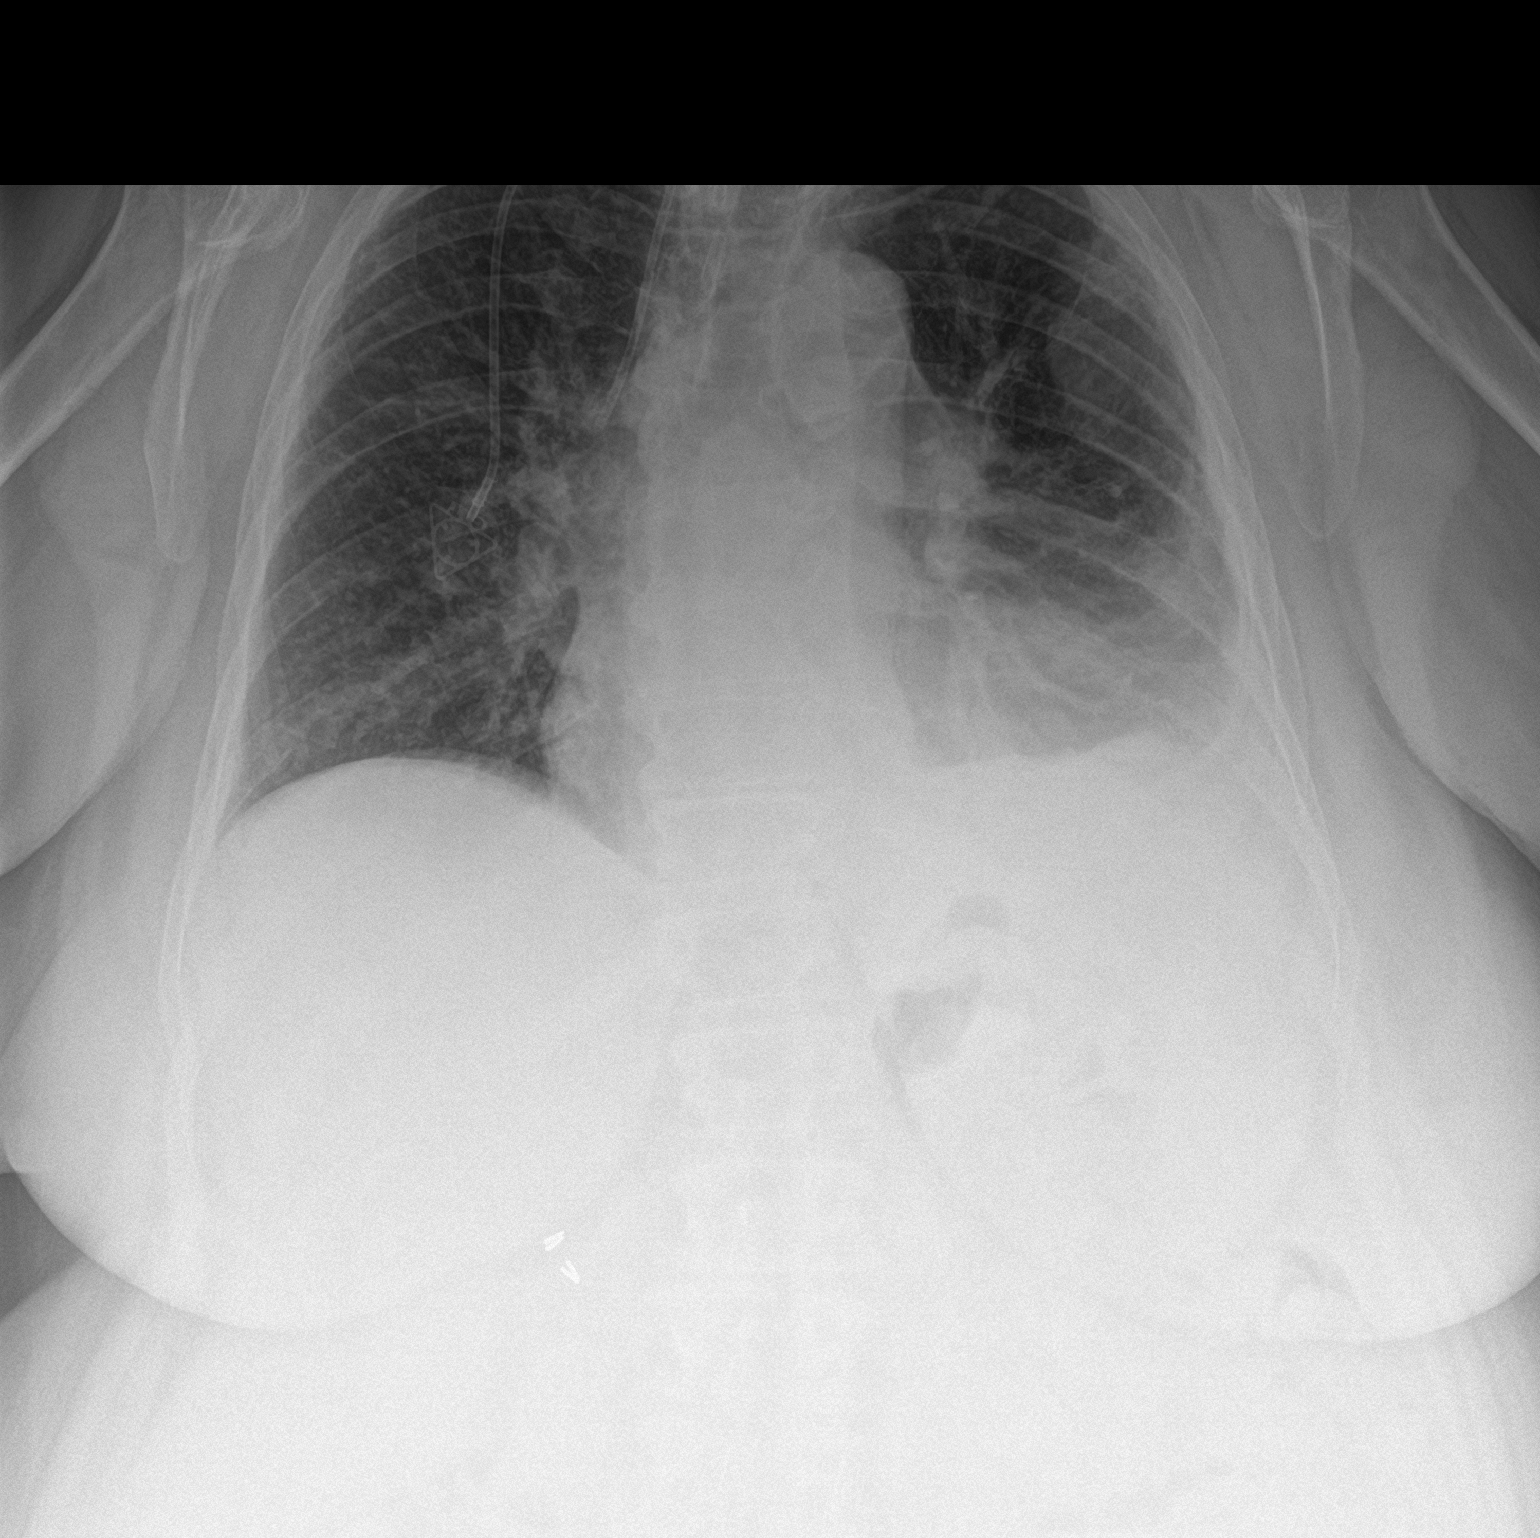

[1 of 1 positions shown; findings below may reference images not displayed]

FINDINGS: Port in the anterior chest wall with tip in distal SVC. Normal
cardiac silhouette. Interval reduction in LEFT pleural fluid
following thoracentesis. Small LEFT pleural effusion remains. No
pneumothorax.
IMPRESSION: 1. No pneumothorax following LEFT thoracentesis.
2. Reduction in LEFT pleural effusion.

## 2023-04-20 IMAGING — US US THORACENTESIS ASP PLEURAL SPACE W/IMG GUIDE
1 series · 2 of 2 positions shown · non-contrast
Comparison: none

INDICATION: Patient with history of metastatic small cell lung cancer, dyspnea,
recurrent left pleural effusion. Request received for therapeutic
left thoracentesis.

[Series 1: us thoracentesis asp pleural s mc & wl · 2 of 2 slices shown]
[im 1/2]
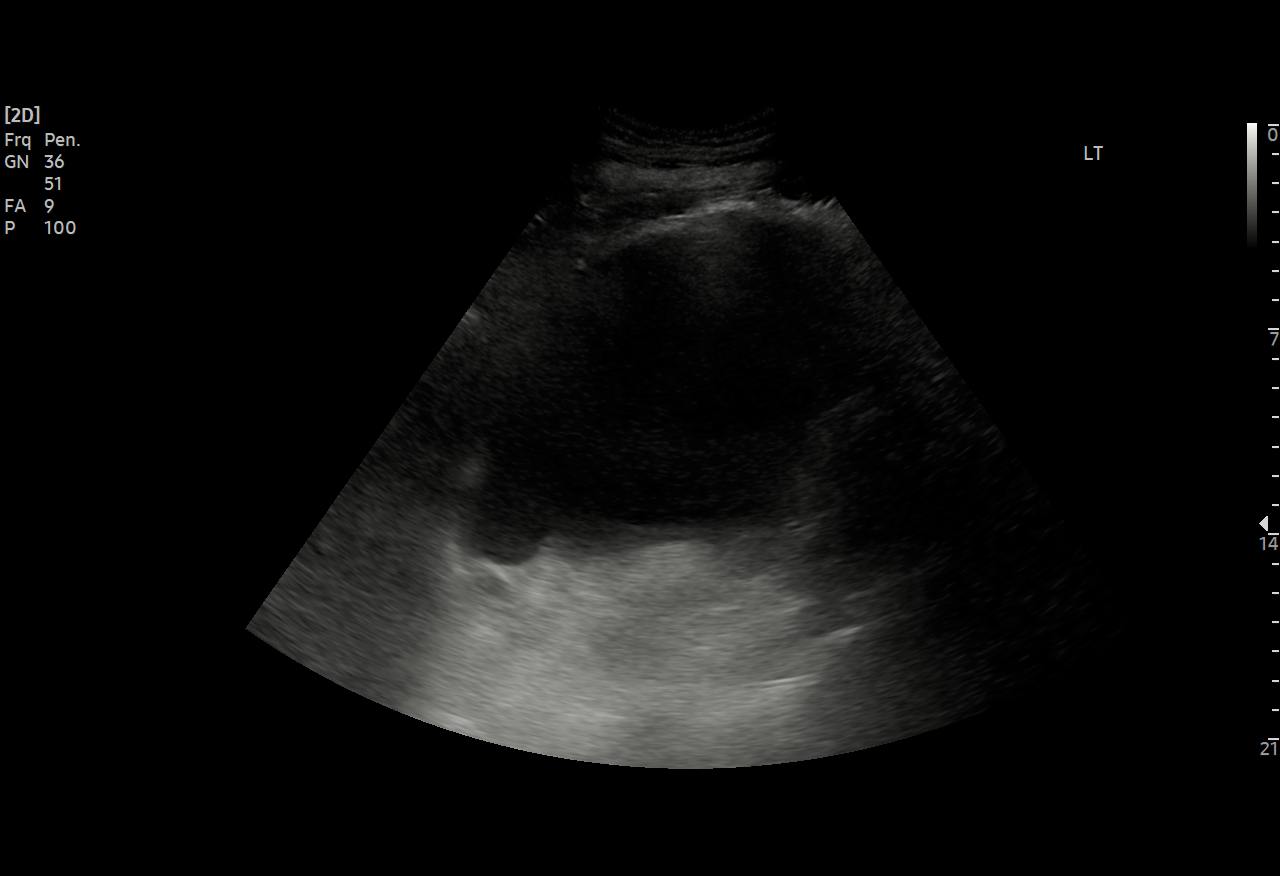
[im 2/2]
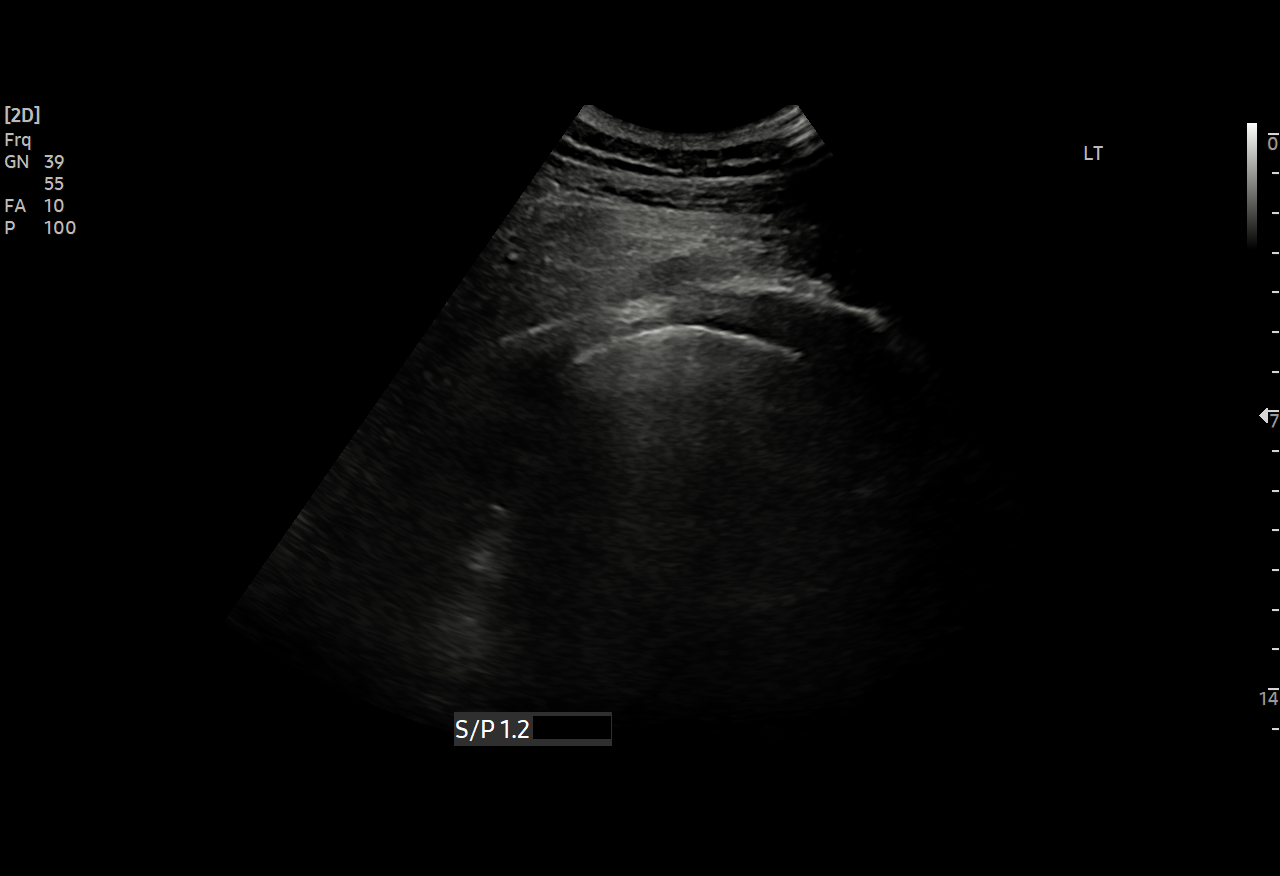

[2 of 2 positions shown; findings below may reference images not displayed]

EXAM:
ULTRASOUND GUIDED THERAPEUTIC LEFT THORACENTESIS

MEDICATIONS:
10 mL 1% lidocaine

COMPLICATIONS:
None immediate.

PROCEDURE:
An ultrasound guided thoracentesis was thoroughly discussed with the
patient and questions answered. The benefits, risks, alternatives
and complications were also discussed. The patient understands and
wishes to proceed with the procedure. Written consent was obtained.

Ultrasound was performed to localize and mark an adequate pocket of
fluid in the left chest. The area was then prepped and draped in the
normal sterile fashion. 1% Lidocaine was used for local anesthesia.
Under ultrasound guidance a 6 Fr Safe-T-Centesis catheter was
introduced. Thoracentesis was performed. The catheter was removed
and a dressing applied.
FINDINGS: A total of approximately 1.2 liters of blood-tinged fluid was
removed.
IMPRESSION: Successful ultrasound guided therapeutic LEFT thoracentesis yielding
1.2 liters of pleural fluid.

## 2023-04-23 DIAGNOSIS — M1990 Unspecified osteoarthritis, unspecified site: Secondary | ICD-10-CM | POA: Diagnosis not present

## 2023-04-23 DIAGNOSIS — C3490 Malignant neoplasm of unspecified part of unspecified bronchus or lung: Secondary | ICD-10-CM | POA: Diagnosis not present

## 2023-04-23 DIAGNOSIS — I5189 Other ill-defined heart diseases: Secondary | ICD-10-CM | POA: Diagnosis not present

## 2023-04-23 DIAGNOSIS — I10 Essential (primary) hypertension: Secondary | ICD-10-CM | POA: Diagnosis not present

## 2023-05-04 IMAGING — CT CT ABD-PELV W/ CM
2 of 5 series · 12 of 36 positions shown, 15 images · IV contrast (APPLIED)
Comparison: PET-CT, 06/06/2021, CT chest, 05/13/2021

CLINICAL DATA: Metastatic lung cancer restaging, ongoing
chemotherapy, former smoker * Tracking Code: BO *

EXAM:
CT CHEST, ABDOMEN, AND PELVIS WITH CONTRAST
TECHNIQUE: Multidetector CT imaging of the chest, abdomen and pelvis was
performed following the standard protocol during bolus
administration of intravenous contrast.

[Series 2: cap with · axial · 0.98mm/px · z∈[-477,+23]mm · 9 of 126 slices shown, 12 images]
[im 13/126  mediastinal]
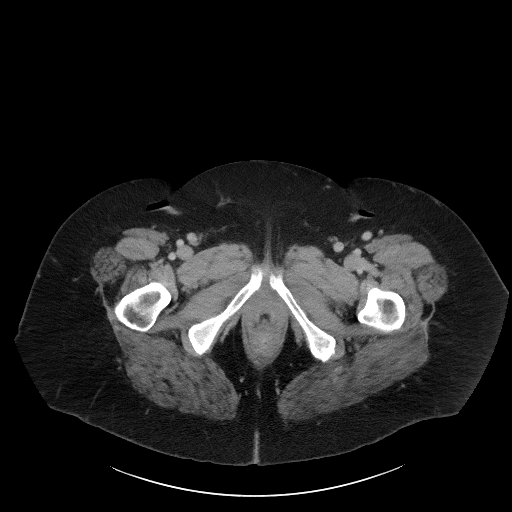
[im 13/126  lung]
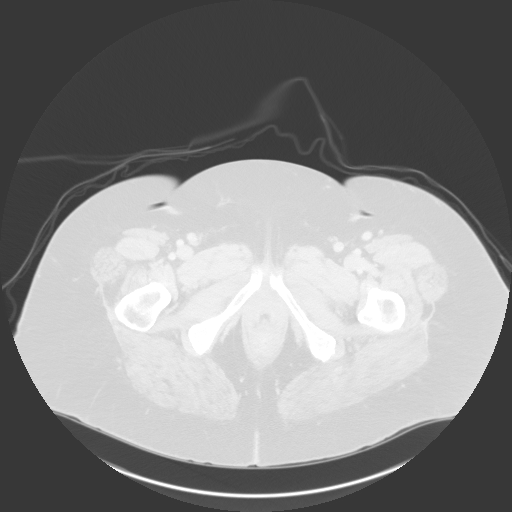
[im 26/126  lung]
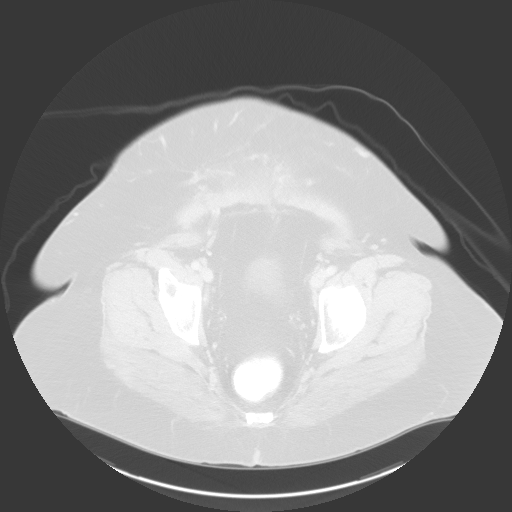
[im 38/126  lung]
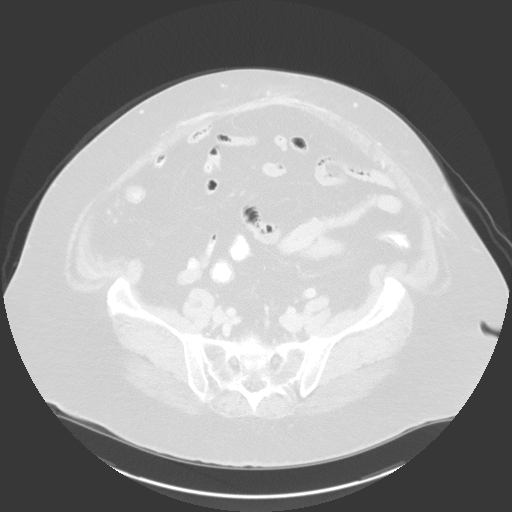
[im 51/126  lung]
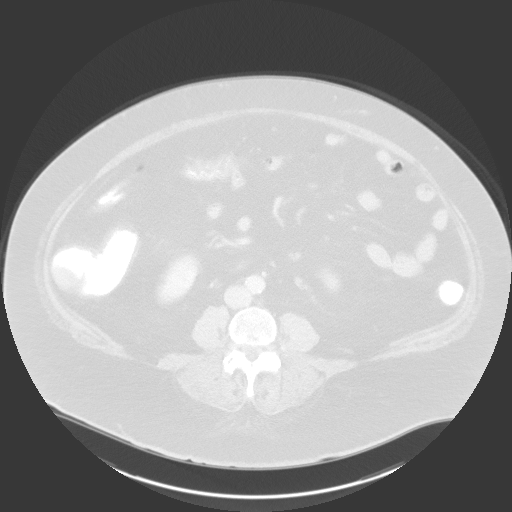
[im 63/126  mediastinal]
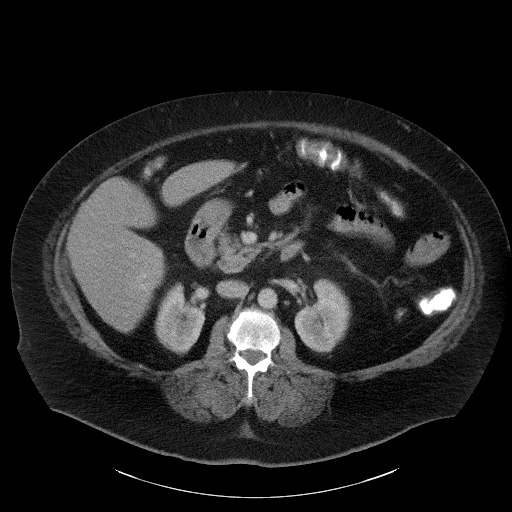
[im 63/126  lung]
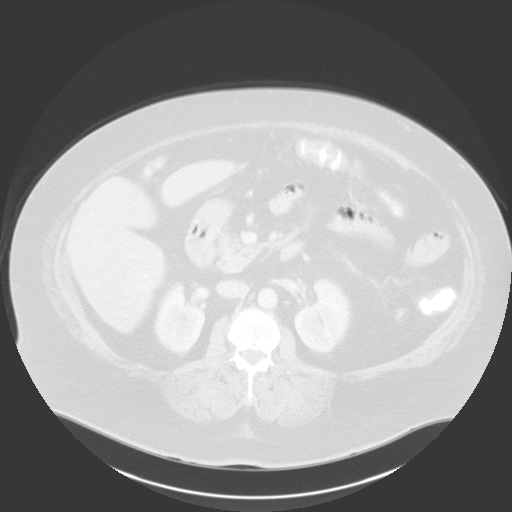
[im 76/126  lung]
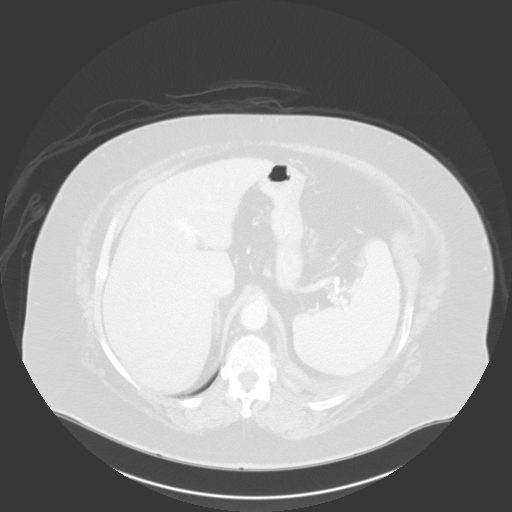
[im 88/126  lung]
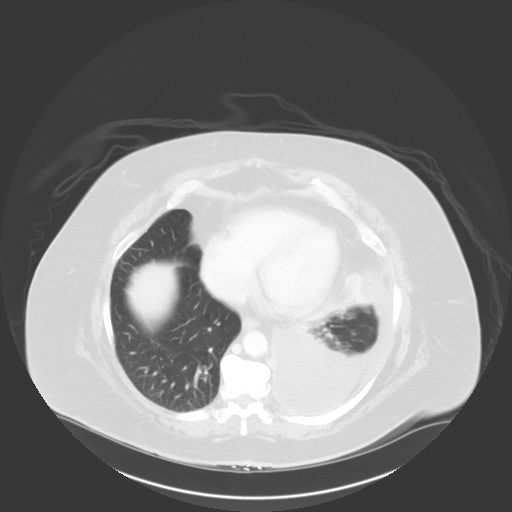
[im 101/126  lung]
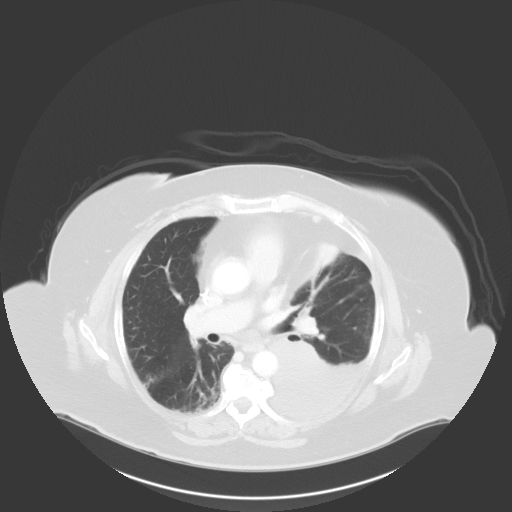
[im 113/126  mediastinal]
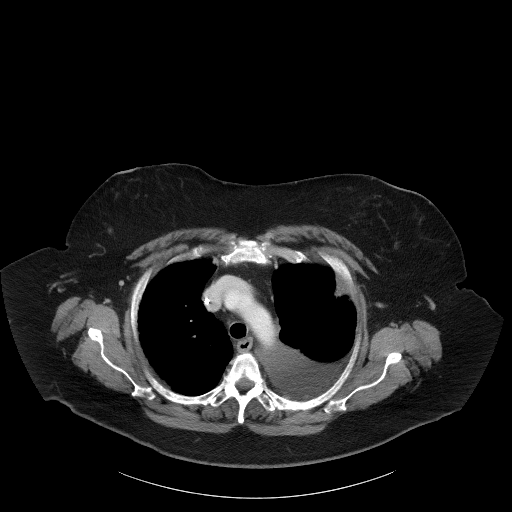
[im 113/126  lung]
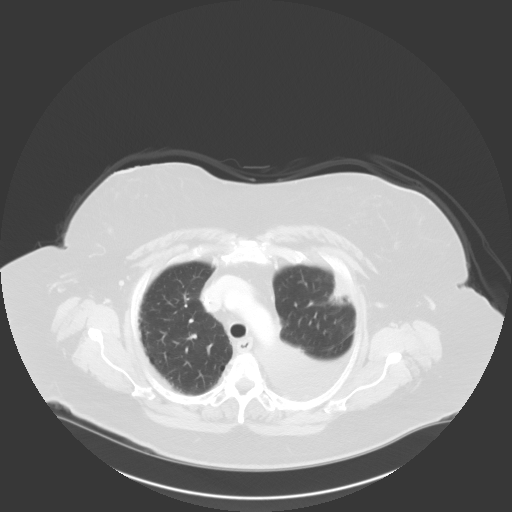

[Series 5: coronals · coronal · 0.98mm/px · 3 of 219 slices shown]
[im 44/219  lung]
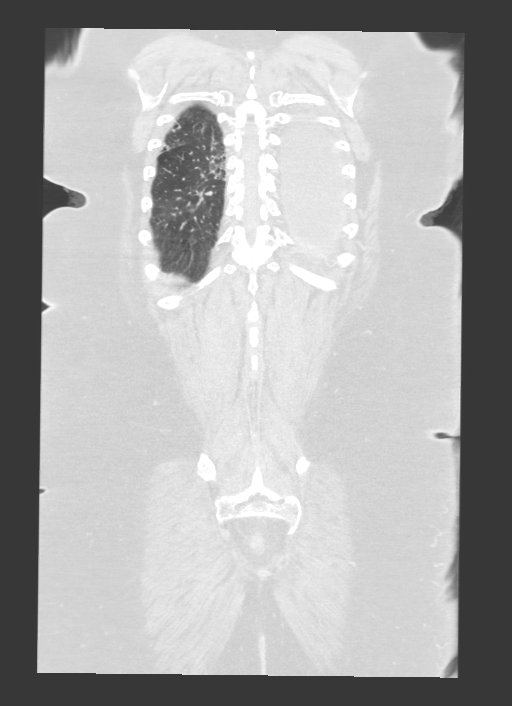
[im 88/219  lung]
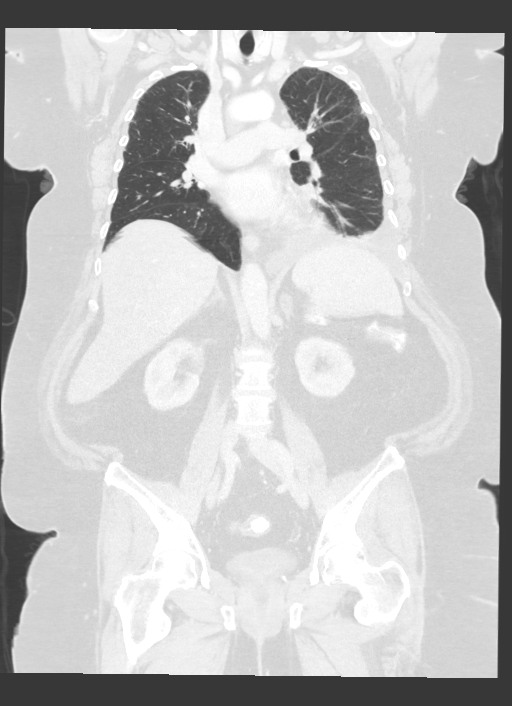
[im 131/219  lung]
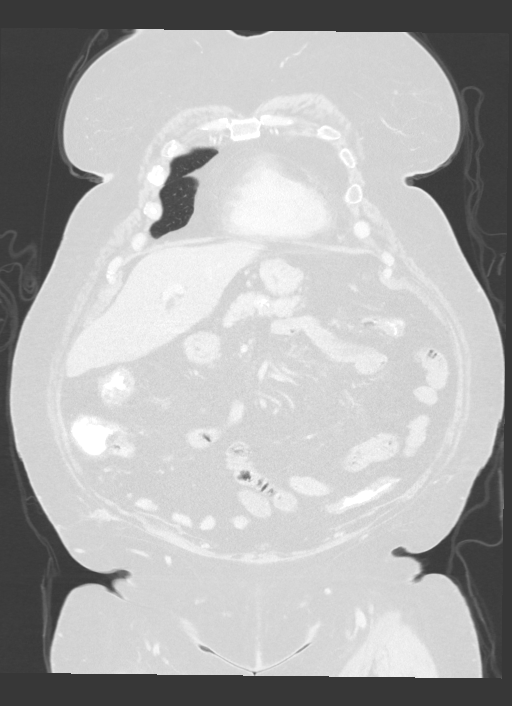

[12 of 36 positions shown; findings below may reference images not displayed]

RADIATION DOSE REDUCTION: This exam was performed according to the
departmental dose-optimization program which includes automated
exposure control, adjustment of the mA and/or kV according to
patient size and/or use of iterative reconstruction technique.

CONTRAST:  100mL OMNIPAQUE IOHEXOL 300 MG/ML SOLN, additional oral
enteric contrast
FINDINGS: CT CHEST FINDINGS

Cardiovascular: Right chest port catheter. Aortic atherosclerosis.
Normal heart size. Left coronary artery calcifications. No
pericardial effusion.

Mediastinum/Nodes: Interval decrease in size of an enlarged,
previously FDG avid left epicardial lymph node, measuring 0.8 cm,
previously 1.2 cm (series 2, image 27). Unchanged subcentimeter AP
window lymph nodes (series 2, image 19). No other enlarged
mediastinal, hilar, or axillary lymph nodes. Thyroid gland, trachea,
and esophagus demonstrate no significant findings.

Lungs/Pleura: Mild paraseptal emphysema. Interval decrease in size
and solid character of a mass of the subpleural anterior left upper
lobe, measuring 2.8 x 1.4 cm, previously 3.0 x 1.9 cm (series 4,
image 37). Slightly diminished volume of a moderate left pleural
effusion with associated atelectasis or consolidation and diminished
pleural thickening and nodularity, index nodule dependently
measuring 1.8 x 0.9 cm, previously 3.1 x 1.5 cm (series 2, image
22).

Musculoskeletal: No chest wall mass.

CT ABDOMEN PELVIS FINDINGS

Hepatobiliary: No focal liver abnormality is seen. Status post
cholecystectomy. No biliary dilatation.

Pancreas: Unremarkable. No pancreatic ductal dilatation or
surrounding inflammatory changes.

Spleen: Normal in size without significant abnormality.

Adrenals/Urinary Tract: Subcentimeter benign adenoma of the left
adrenal gland, of macroscopic fatty attenuation and previously not
FDG avid (series 2, image 54). Kidneys are normal, without renal
calculi, solid lesion, or hydronephrosis. Small calculus noted in
the left ovarian vein, which does not reflect a urinary tract
calculus (series 2, image 74). Bladder is unremarkable.

Stomach/Bowel: Stomach is within normal limits. Appendix appears
normal. No evidence of bowel wall thickening, distention, or
inflammatory changes.

Vascular/Lymphatic: Aortic atherosclerosis. Unchanged prominent left
crural lymph node measuring 1.3 x 0.8 cm, previously FDG avid
(series 2, image 58). No other enlarged abdominal or pelvic lymph
nodes.

Reproductive: Status post hysterectomy.

Other: No abdominal wall hernia or abnormality. No ascites.

Musculoskeletal: No acute osseous findings. Subtle sclerosis of the
anterior right fifth rib near the costochondral junction (series 2,
image 29).
IMPRESSION: 1. Interval decrease in size and solid character of a mass of the
subpleural anterior left upper lobe, consistent with treatment
response.
2. Slightly diminished volume of a moderate left pleural effusion
with associated atelectasis or consolidation and diminished pleural
thickening and nodularity, consistent with treatment response of
pleural metastatic disease
3. Interval decrease in size of an enlarged, previously FDG avid
left epicardial lymph node, with unchanged subcentimeter AP window
and left crural lymph nodes. Findings are consistent with treatment
response of nodal metastatic disease.
4. No evidence of soft tissue metastatic disease to the abdomen or
pelvis.
5. Subtle sclerosis of the anterior right fifth rib, at the site of
FDG avid osseous metastasis. No other CT correlate for previously
noted FDG avid osseous metastases.
6. Emphysema.
7. Coronary artery disease.

Aortic Atherosclerosis (DM0Y5-K7R.R) and Emphysema ([id number].Y).

## 2023-05-04 IMAGING — CT CT CHEST W/ CM
2 of 4 series · 13 of 36 positions shown, 16 images · IV contrast (APPLIED)
Comparison: PET-CT, 06/06/2021, CT chest, 05/13/2021

CLINICAL DATA: Metastatic lung cancer restaging, ongoing
chemotherapy, former smoker * Tracking Code: BO *

EXAM:
CT CHEST, ABDOMEN, AND PELVIS WITH CONTRAST
TECHNIQUE: Multidetector CT imaging of the chest, abdomen and pelvis was
performed following the standard protocol during bolus
administration of intravenous contrast.

[Series 504: cap with · axial · 0.98mm/px · z∈[-482,+28]mm · 10 of 126 slices shown, 13 images]
[im 12/126  mediastinal]
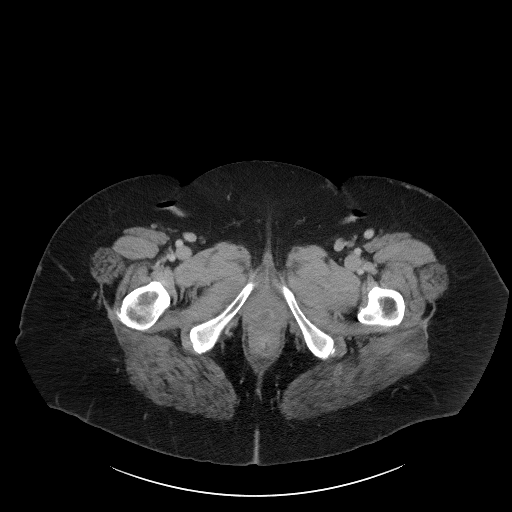
[im 12/126  lung]
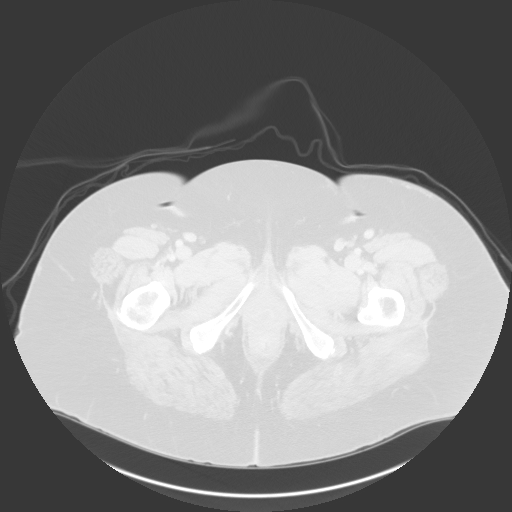
[im 23/126  lung]
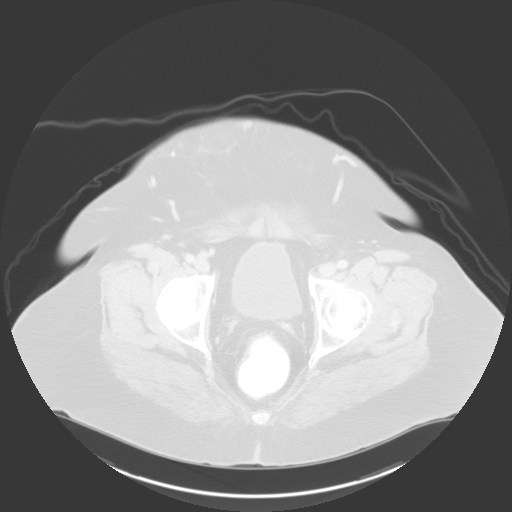
[im 35/126  lung]
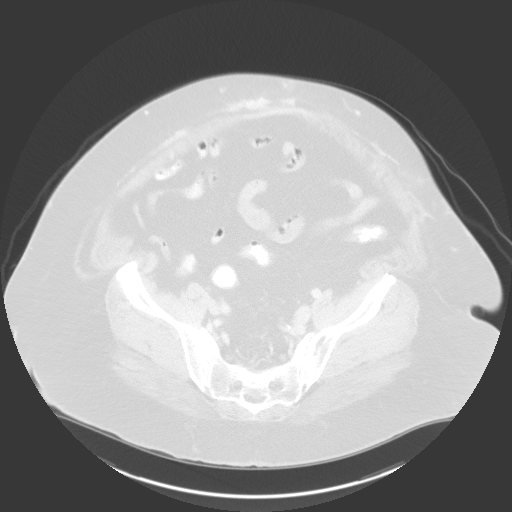
[im 46/126  lung]
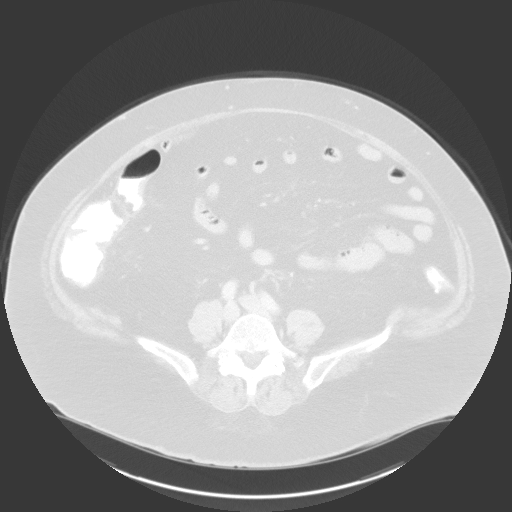
[im 57/126  mediastinal]
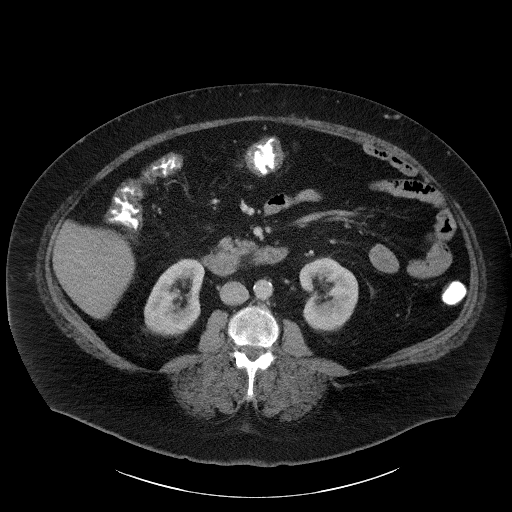
[im 57/126  lung]
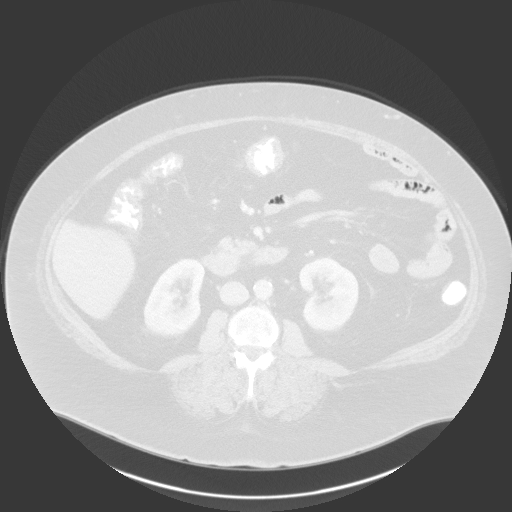
[im 69/126  lung]
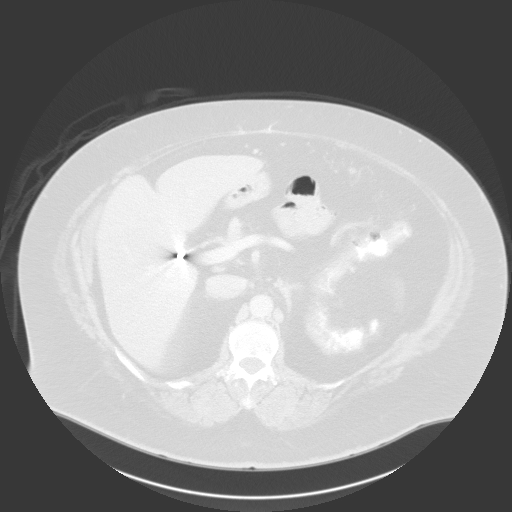
[im 80/126  lung]
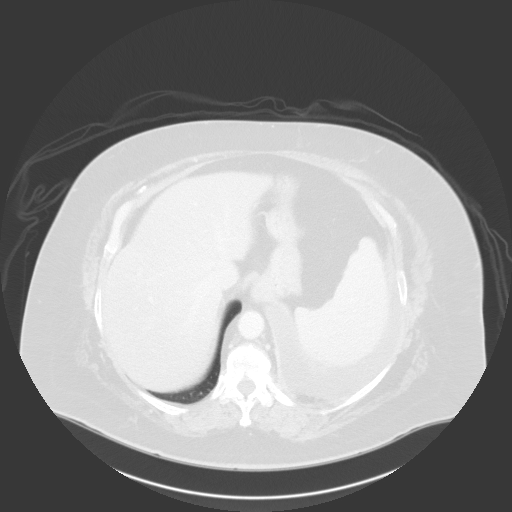
[im 91/126  lung]
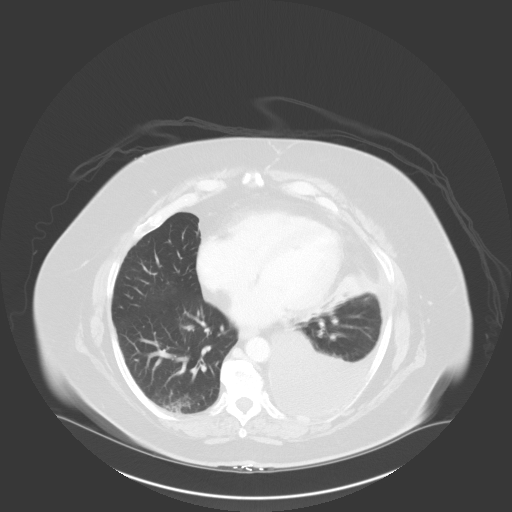
[im 103/126  mediastinal]
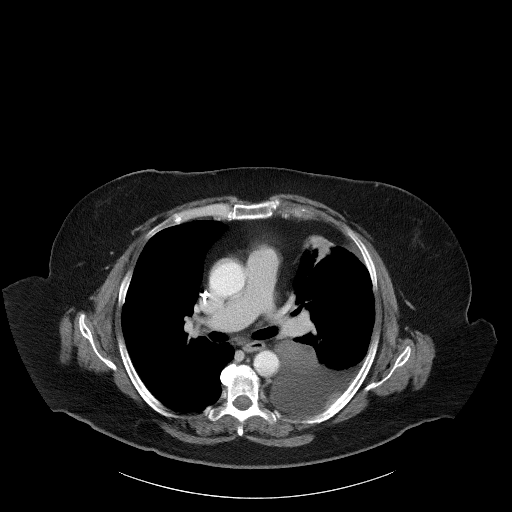
[im 103/126  lung]
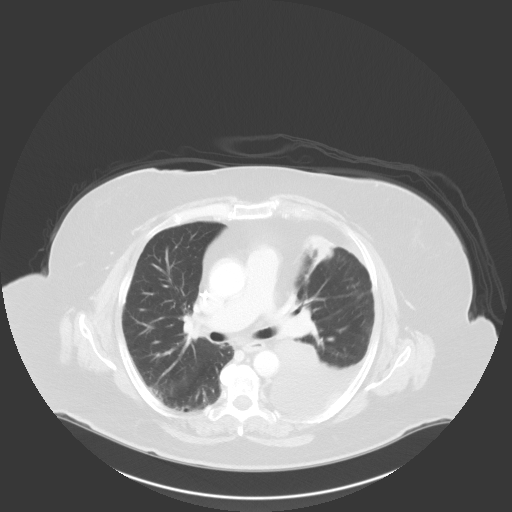
[im 114/126  lung]
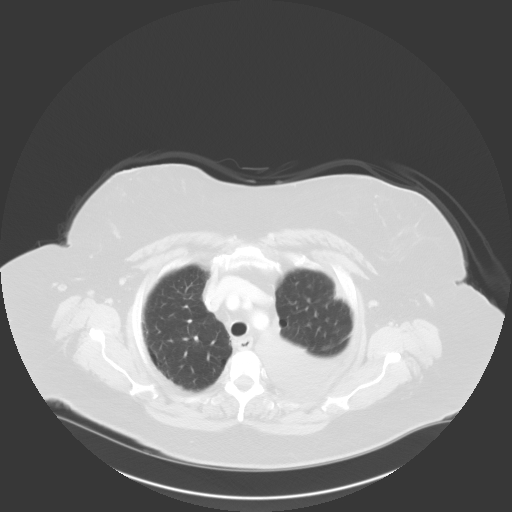

[Series 507: coronals · coronal · 0.98mm/px · 3 of 219 slices shown]
[im 44/219  lung]
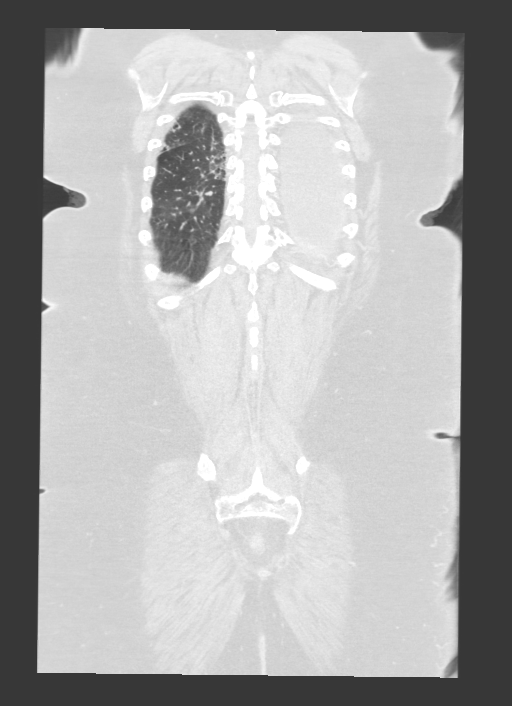
[im 88/219  lung]
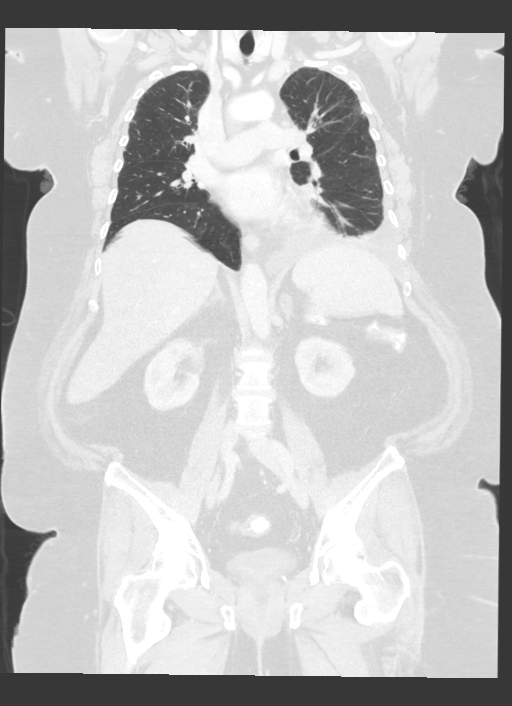
[im 131/219  lung]
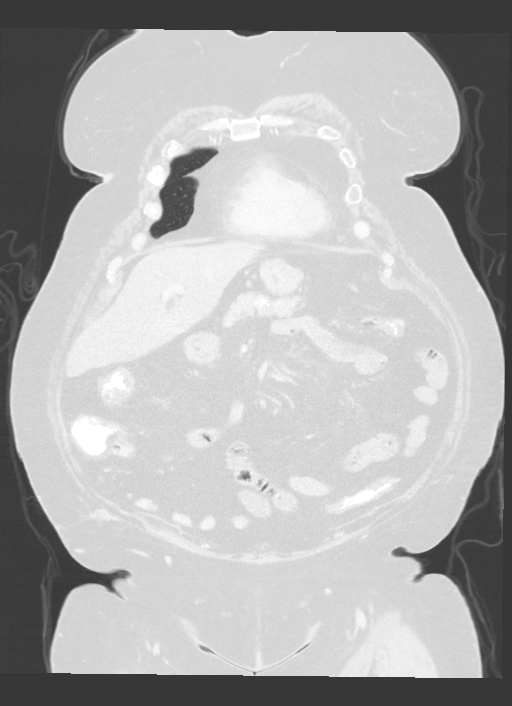

[13 of 36 positions shown; findings below may reference images not displayed]

RADIATION DOSE REDUCTION: This exam was performed according to the
departmental dose-optimization program which includes automated
exposure control, adjustment of the mA and/or kV according to
patient size and/or use of iterative reconstruction technique.

CONTRAST:  100mL OMNIPAQUE IOHEXOL 300 MG/ML SOLN, additional oral
enteric contrast
FINDINGS: CT CHEST FINDINGS

Cardiovascular: Right chest port catheter. Aortic atherosclerosis.
Normal heart size. Left coronary artery calcifications. No
pericardial effusion.

Mediastinum/Nodes: Interval decrease in size of an enlarged,
previously FDG avid left epicardial lymph node, measuring 0.8 cm,
previously 1.2 cm (series 2, image 27). Unchanged subcentimeter AP
window lymph nodes (series 2, image 19). No other enlarged
mediastinal, hilar, or axillary lymph nodes. Thyroid gland, trachea,
and esophagus demonstrate no significant findings.

Lungs/Pleura: Mild paraseptal emphysema. Interval decrease in size
and solid character of a mass of the subpleural anterior left upper
lobe, measuring 2.8 x 1.4 cm, previously 3.0 x 1.9 cm (series 4,
image 37). Slightly diminished volume of a moderate left pleural
effusion with associated atelectasis or consolidation and diminished
pleural thickening and nodularity, index nodule dependently
measuring 1.8 x 0.9 cm, previously 3.1 x 1.5 cm (series 2, image
22).

Musculoskeletal: No chest wall mass.

CT ABDOMEN PELVIS FINDINGS

Hepatobiliary: No focal liver abnormality is seen. Status post
cholecystectomy. No biliary dilatation.

Pancreas: Unremarkable. No pancreatic ductal dilatation or
surrounding inflammatory changes.

Spleen: Normal in size without significant abnormality.

Adrenals/Urinary Tract: Subcentimeter benign adenoma of the left
adrenal gland, of macroscopic fatty attenuation and previously not
FDG avid (series 2, image 54). Kidneys are normal, without renal
calculi, solid lesion, or hydronephrosis. Small calculus noted in
the left ovarian vein, which does not reflect a urinary tract
calculus (series 2, image 74). Bladder is unremarkable.

Stomach/Bowel: Stomach is within normal limits. Appendix appears
normal. No evidence of bowel wall thickening, distention, or
inflammatory changes.

Vascular/Lymphatic: Aortic atherosclerosis. Unchanged prominent left
crural lymph node measuring 1.3 x 0.8 cm, previously FDG avid
(series 2, image 58). No other enlarged abdominal or pelvic lymph
nodes.

Reproductive: Status post hysterectomy.

Other: No abdominal wall hernia or abnormality. No ascites.

Musculoskeletal: No acute osseous findings. Subtle sclerosis of the
anterior right fifth rib near the costochondral junction (series 2,
image 29).
IMPRESSION: 1. Interval decrease in size and solid character of a mass of the
subpleural anterior left upper lobe, consistent with treatment
response.
2. Slightly diminished volume of a moderate left pleural effusion
with associated atelectasis or consolidation and diminished pleural
thickening and nodularity, consistent with treatment response of
pleural metastatic disease
3. Interval decrease in size of an enlarged, previously FDG avid
left epicardial lymph node, with unchanged subcentimeter AP window
and left crural lymph nodes. Findings are consistent with treatment
response of nodal metastatic disease.
4. No evidence of soft tissue metastatic disease to the abdomen or
pelvis.
5. Subtle sclerosis of the anterior right fifth rib, at the site of
FDG avid osseous metastasis. No other CT correlate for previously
noted FDG avid osseous metastases.
6. Emphysema.
7. Coronary artery disease.

Aortic Atherosclerosis ([id number]-[id number].R) and Emphysema ([id number].Y).

## 2023-05-12 IMAGING — DX DG CHEST 1V PORT
1 series · 1 of 1 positions shown · non-contrast
Comparison: Chest x-ray 06/22/2021.

CLINICAL DATA: Status post thoracentesis.

EXAM:
PORTABLE CHEST 1 VIEW

[chest ap]
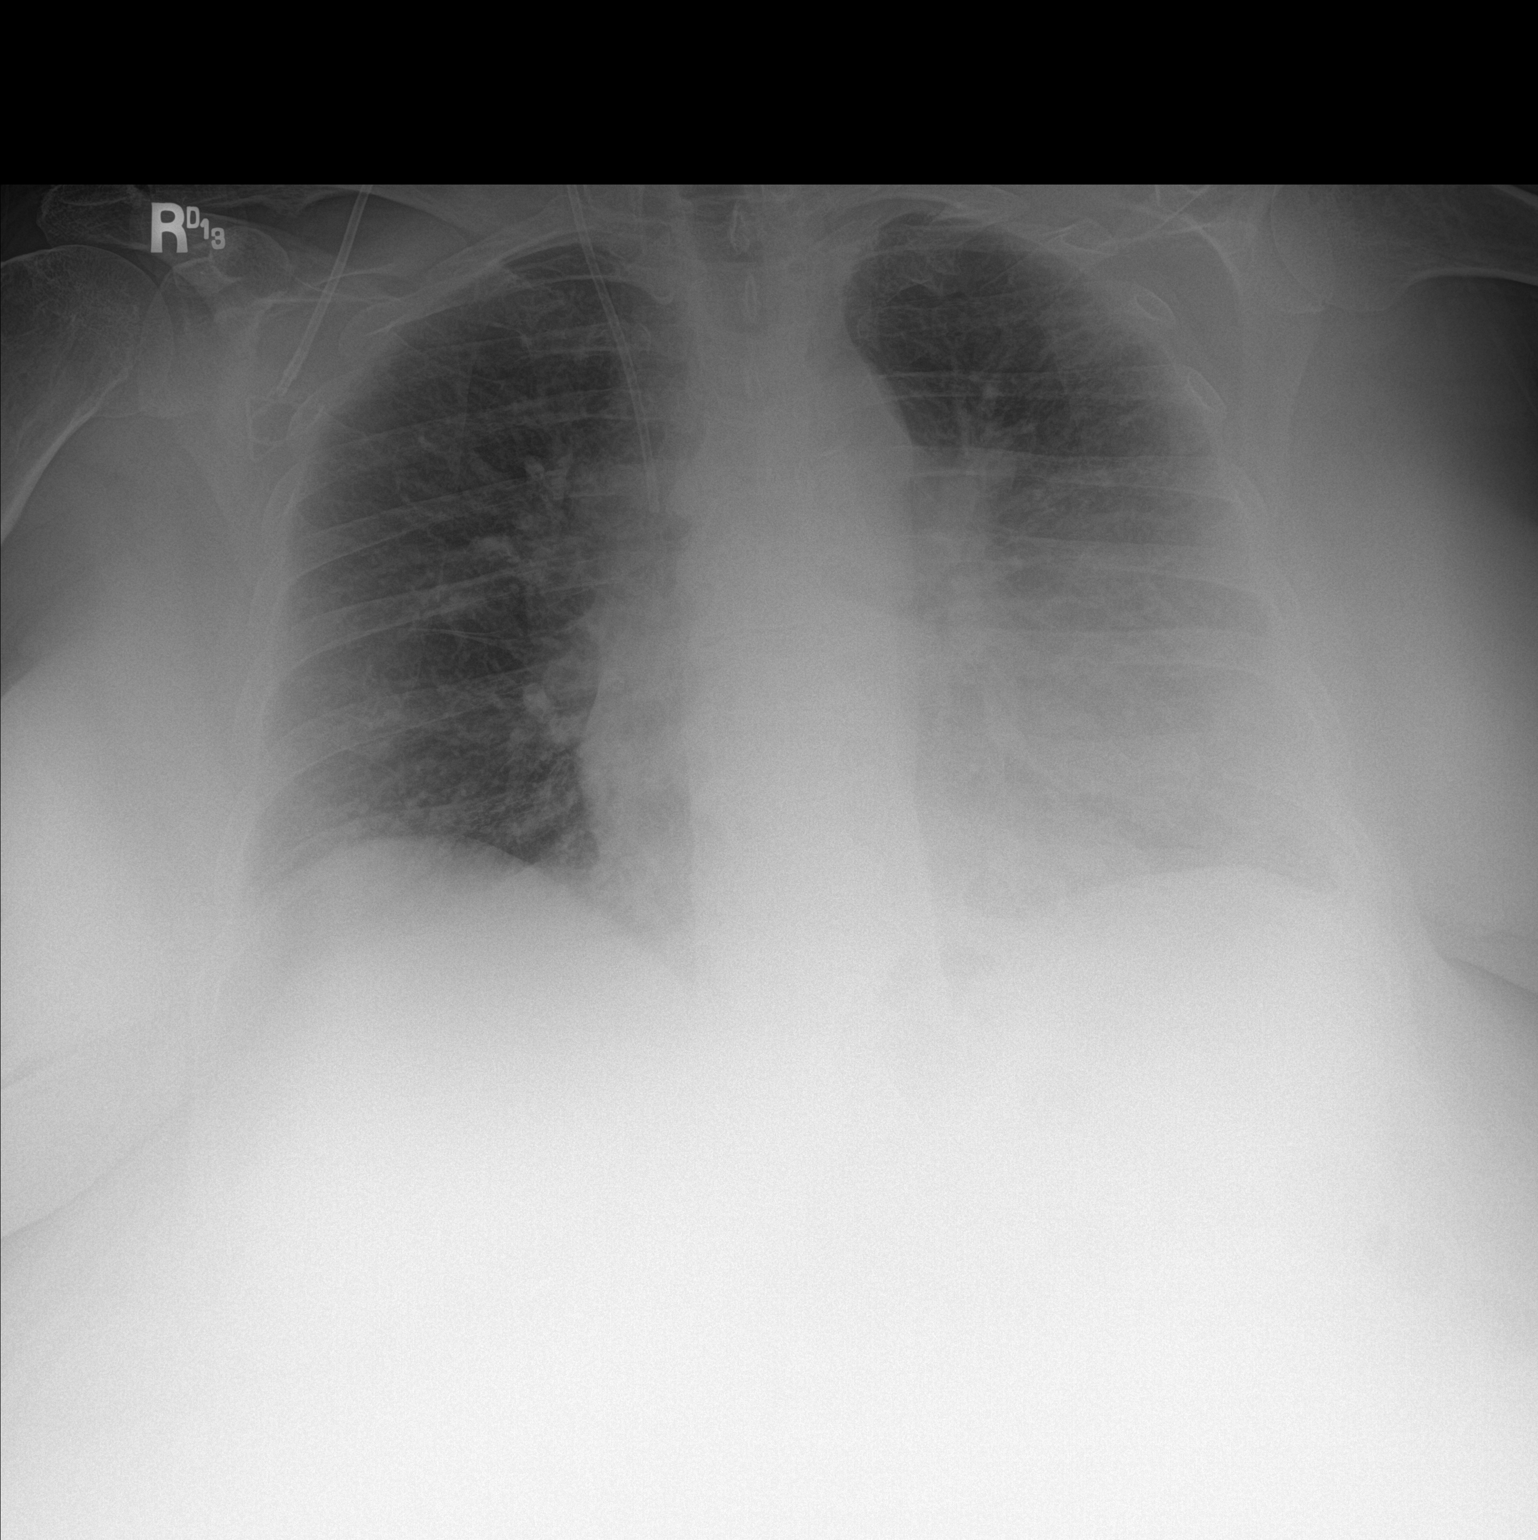

[1 of 1 positions shown; findings below may reference images not displayed]

FINDINGS: Right chest port catheter tip projects over the SVC. Left pleural
effusion has decreased from prior. There is new haziness in the left
mid lung silhouetting the left cardiac border. Right lung is clear.
No evidence for pneumothorax or acute fracture. Cardiomediastinal
silhouette appears stable.
IMPRESSION: 1. Increasing left mid lung airspace disease.
2. Decreasing small left pleural effusion.

## 2023-05-24 DIAGNOSIS — C3490 Malignant neoplasm of unspecified part of unspecified bronchus or lung: Secondary | ICD-10-CM | POA: Diagnosis not present

## 2023-05-24 DIAGNOSIS — I10 Essential (primary) hypertension: Secondary | ICD-10-CM | POA: Diagnosis not present

## 2023-05-24 DIAGNOSIS — I5189 Other ill-defined heart diseases: Secondary | ICD-10-CM | POA: Diagnosis not present

## 2023-05-24 DIAGNOSIS — M1990 Unspecified osteoarthritis, unspecified site: Secondary | ICD-10-CM | POA: Diagnosis not present

## 2023-05-29 ENCOUNTER — Observation Stay (HOSPITAL_COMMUNITY)

## 2023-05-29 ENCOUNTER — Observation Stay (HOSPITAL_COMMUNITY)
Admission: EM | Admit: 2023-05-29 | Discharge: 2023-05-30 | Discharge status: Against Medical Advice | Attending: Internal Medicine | Admitting: Internal Medicine

## 2023-05-29 ENCOUNTER — Emergency Department (HOSPITAL_COMMUNITY)

## 2023-05-29 ENCOUNTER — Other Ambulatory Visit: Payer: Self-pay

## 2023-05-29 DIAGNOSIS — C3490 Malignant neoplasm of unspecified part of unspecified bronchus or lung: Secondary | ICD-10-CM | POA: Diagnosis not present

## 2023-05-29 DIAGNOSIS — Z87891 Personal history of nicotine dependence: Secondary | ICD-10-CM | POA: Diagnosis not present

## 2023-05-29 DIAGNOSIS — I10 Essential (primary) hypertension: Secondary | ICD-10-CM | POA: Insufficient documentation

## 2023-05-29 DIAGNOSIS — C799 Secondary malignant neoplasm of unspecified site: Secondary | ICD-10-CM | POA: Diagnosis not present

## 2023-05-29 DIAGNOSIS — J9621 Acute and chronic respiratory failure with hypoxia: Secondary | ICD-10-CM | POA: Diagnosis present

## 2023-05-29 DIAGNOSIS — Z79899 Other long term (current) drug therapy: Secondary | ICD-10-CM | POA: Diagnosis not present

## 2023-05-29 DIAGNOSIS — Z7984 Long term (current) use of oral hypoglycemic drugs: Secondary | ICD-10-CM | POA: Insufficient documentation

## 2023-05-29 DIAGNOSIS — R0602 Shortness of breath: Secondary | ICD-10-CM | POA: Diagnosis not present

## 2023-05-29 DIAGNOSIS — Z1152 Encounter for screening for COVID-19: Secondary | ICD-10-CM | POA: Insufficient documentation

## 2023-05-29 DIAGNOSIS — C3492 Malignant neoplasm of unspecified part of left bronchus or lung: Principal | ICD-10-CM | POA: Insufficient documentation

## 2023-05-29 LAB — CBC WITH DIFFERENTIAL/PLATELET
Abs Immature Granulocytes: 0.08 10*3/uL — ABNORMAL HIGH (ref 0.00–0.07)
Basophils Absolute: 0 10*3/uL (ref 0.0–0.1)
Basophils Relative: 0 %
Eosinophils Absolute: 0 10*3/uL (ref 0.0–0.5)
Eosinophils Relative: 0 %
HCT: 35.2 % — ABNORMAL LOW (ref 36.0–46.0)
Hemoglobin: 11 g/dL — ABNORMAL LOW (ref 12.0–15.0)
Immature Granulocytes: 1 %
Lymphocytes Relative: 3 %
Lymphs Abs: 0.4 10*3/uL — ABNORMAL LOW (ref 0.7–4.0)
MCH: 33.7 pg (ref 26.0–34.0)
MCHC: 31.3 g/dL (ref 30.0–36.0)
MCV: 108 fL — ABNORMAL HIGH (ref 80.0–100.0)
Monocytes Absolute: 0.4 10*3/uL (ref 0.1–1.0)
Monocytes Relative: 3 %
Neutro Abs: 11.8 10*3/uL — ABNORMAL HIGH (ref 1.7–7.7)
Neutrophils Relative %: 93 %
Platelets: 166 10*3/uL (ref 150–400)
RBC: 3.26 MIL/uL — ABNORMAL LOW (ref 3.87–5.11)
RDW: 14.5 % (ref 11.5–15.5)
WBC: 12.7 10*3/uL — ABNORMAL HIGH (ref 4.0–10.5)
nRBC: 0 % (ref 0.0–0.2)

## 2023-05-29 LAB — COMPREHENSIVE METABOLIC PANEL WITH GFR
ALT: 14 U/L (ref 0–44)
AST: 20 U/L (ref 15–41)
Albumin: 3.8 g/dL (ref 3.5–5.0)
Alkaline Phosphatase: 78 U/L (ref 38–126)
Anion gap: 10 (ref 5–15)
BUN: 18 mg/dL (ref 6–20)
CO2: 26 mmol/L (ref 22–32)
Calcium: 9.6 mg/dL (ref 8.9–10.3)
Chloride: 102 mmol/L (ref 98–111)
Creatinine, Ser: 0.89 mg/dL (ref 0.44–1.00)
GFR, Estimated: >60 mL/min (ref >60)
Glucose, Bld: 149 mg/dL — ABNORMAL HIGH (ref 70–99)
Potassium: 4.2 mmol/L (ref 3.5–5.1)
Sodium: 138 mmol/L (ref 135–145)
Total Bilirubin: 0.5 mg/dL (ref 0.0–1.2)
Total Protein: 8.1 g/dL (ref 6.5–8.1)

## 2023-05-29 LAB — RESP PANEL BY RT-PCR (RSV, FLU A&B, COVID)  RVPGX2
Influenza A by PCR: NEGATIVE
Influenza B by PCR: NEGATIVE
Resp Syncytial Virus by PCR: NEGATIVE
SARS Coronavirus 2 by RT PCR: NEGATIVE

## 2023-05-29 MED ORDER — MELATONIN 5 MG PO TABS: 5.0000 mg | ORAL_TABLET | Freq: Every evening | ORAL | Status: DC | PRN

## 2023-05-29 MED ORDER — ENOXAPARIN SODIUM 60 MG/0.6ML IJ SOSY
60.0000 mg | PREFILLED_SYRINGE | INTRAMUSCULAR | Status: DC
  Filled 2023-05-29: qty 0.6

## 2023-05-29 MED ORDER — IOHEXOL 300 MG/ML  SOLN
75.0000 mL | Freq: Once | INTRAMUSCULAR | Status: AC | PRN
  Administered 2023-05-30: 75 mL via INTRAVENOUS

## 2023-05-29 MED ORDER — IPRATROPIUM-ALBUTEROL 0.5-2.5 (3) MG/3ML IN SOLN: 3.0000 mL | RESPIRATORY_TRACT | Status: DC | PRN

## 2023-05-29 MED ORDER — PROCHLORPERAZINE EDISYLATE 10 MG/2ML IJ SOLN: 5.0000 mg | Freq: Four times a day (QID) | INTRAMUSCULAR | Status: DC | PRN

## 2023-05-29 MED ORDER — PIPERACILLIN-TAZOBACTAM 3.375 G IVPB
3.3750 g | Freq: Three times a day (TID) | INTRAVENOUS | Status: DC
  Administered 2023-05-30 (×3): 3.375 g via INTRAVENOUS
  Filled 2023-05-29 (×3): qty 50

## 2023-05-29 MED ORDER — ACETAMINOPHEN 325 MG PO TABS
650.0000 mg | ORAL_TABLET | Freq: Four times a day (QID) | ORAL | Status: DC | PRN
  Administered 2023-05-30: 650 mg via ORAL
  Filled 2023-05-29: qty 2

## 2023-05-29 MED ORDER — OXYCODONE HCL 5 MG PO TABS: 5.0000 mg | ORAL_TABLET | Freq: Four times a day (QID) | ORAL | Status: DC | PRN

## 2023-05-29 MED ORDER — GUAIFENESIN-DM 100-10 MG/5ML PO SYRP: 5.0000 mL | ORAL_SOLUTION | ORAL | Status: DC | PRN

## 2023-05-29 MED ORDER — POLYETHYLENE GLYCOL 3350 17 G PO PACK: 17.0000 g | PACK | Freq: Every day | ORAL | Status: DC | PRN

## 2023-05-29 NOTE — ED Triage Notes (Signed)
 Pt brought by EMS from home,  Pt complains of shortness of breathing and started 3 days ago. Pt use Oxygen  at home baseline of 2L [place]. As per PT her oxygen  dropping at 80% at 2l so they bump it up to 8L. Pt alert x 4. Dont want to be and bed and most comfortable sitting on a WC

## 2023-05-29 NOTE — ED Provider Notes (Signed)
 [place] EMERGENCY DEPARTMENT AT [facility] Provider Note   CSN: [id number] Arrival date & time: 05/29/23  2103     History  Chief Complaint  Patient presents with   Shortness of Breath    Patricia Pena is a 58 y.o. female.   Shortness of Breath Patient with small cell lung cancer.  Shortness of breath.  Worse for the last 3 days.  Did have infusion today.  Is on 2 L chronically but has had to increase.  Does not think she would be able to walk very far at all.  Occasional cough.  Has had pleural effusion previously.  States she is supposed to have an infusion tomorrow and is hoping to be able to get it done.  States she had a CT scan a week ago which did not show large effusion.    Past Medical History:  Diagnosis Date   Arthritis    Cancer (HCC)    Hypertension    Incisional hernia without mention of obstruction or gangrene    Morbid obesity (HCC)    Seizures (HCC)    last one was in 1985    Home Medications Prior to Admission medications   Medication Sig Start Date End Date Taking? Authorizing Provider  CALCIUM -VITAMIN D  PO Take 2,000 mg by mouth daily.    [provider]  cholecalciferol  (VITAMIN D ) 1000 units tablet Take 1,000 Units by mouth daily.    [provider]  Cyanocobalamin (VITAMIN B-12 PO) Take 1 tablet by mouth daily. Patient not taking: Reported on 06/20/2021    [provider]  Desoximetasone 0.05 % OINT Apply 1 application. topically daily as needed (For skin rash). 04/07/17   [provider]  furosemide  (LASIX ) 20 MG tablet Take 1 tablet (20 mg total) by mouth daily as needed (weight gain of 3lbs in 1 day or 5lbs in 1 week). 03/20/22 03/20/23  Jerrlyn Morel, NP  hydrochlorothiazide  (MICROZIDE ) 12.5 MG capsule Take 1 capsule (12.5 mg total) by mouth daily. 03/22/22   Jerrlyn Morel, NP  hydrOXYzine  (ATARAX ) 10 MG tablet Take 1 tablet (10 mg total) by mouth 3 (three) times daily as needed. Patient not  taking: Reported on 01/24/2023 05/20/21   Jerrlyn Morel, NP  lidocaine -prilocaine  (EMLA ) cream Apply to the Port-A-Cath site 30 minutes before chemotherapy Patient not taking: Reported on 01/24/2023 05/24/21   Marlene Simas, MD  loperamide  (IMODIUM ) 2 MG capsule Take 2 mg by mouth as needed for diarrhea or loose stools. Take 2 capsules  after first diarrhea stool, then take one capsule after each subsequent stool up to 6-8/day    [provider]  LORazepam  (ATIVAN ) 1 MG tablet 1 tablet 30 minutes before MRI of the brain. 06/13/21   Marlene Simas, MD  magnesium 30 MG tablet Take 30 mg by mouth 2 (two) times daily.    [provider]  ondansetron  (ZOFRAN ) 8 MG tablet Take 1 tablet (8 mg total) by mouth every 8 (eight) hours as needed for nausea or vomiting. 07/11/21   Heilingoetter, Cassandra L, PA-C  potassium chloride  SA (KLOR-CON  M) 20 MEQ tablet Take 2 tablets (40 mEq total) by mouth daily. Patient not taking: Reported on 01/24/2023 08/15/21   Marlene Simas, MD  predniSONE  (DELTASONE ) 10 MG tablet Take 4 tabs for 2 days, then 3 tabs for 2 days, then 2 tabs for 2 days, then 1 tab for 2 days, then stop 01/24/23   Nichols, Tonya S, NP  prochlorperazine  (COMPAZINE )  10 MG tablet Take 1 tablet (10 mg total) by mouth every 6 (six) hours as needed. Patient not taking: Reported on 01/24/2023 07/11/21   Heilingoetter, Cassandra L, PA-C      Allergies    Codeine    Review of Systems   Review of Systems  Respiratory:  Positive for shortness of breath.     Physical Exam Updated Vital Signs BP (!) 152/95 (BP Location: Left Arm)   Pulse 87   Temp 98.4 F (36.9 C) (Oral)   Resp 20   Ht 5\' 3"  (1.6 m)   Wt 117.9 kg   SpO2 97%   BMI 46.06 kg/m  Physical Exam Vitals and nursing note reviewed.  Cardiovascular:     Rate and Rhythm: Normal rate and regular rhythm.  Pulmonary:     Comments: Decreased breath sounds left lung fields. Chest:     Chest wall: No tenderness.   Abdominal:     Tenderness: There is no abdominal tenderness.  Musculoskeletal:     Right lower leg: No tenderness.     Left lower leg: No tenderness.  Neurological:     Mental Status: She is alert.     ED Results / Procedures / Treatments   Labs (all labs ordered are listed, but only abnormal results are displayed) Labs Reviewed  COMPREHENSIVE METABOLIC PANEL WITH GFR - Abnormal; Notable for the following components:      Result Value   Glucose, Bld 149 (*)    All other components within normal limits  CBC WITH DIFFERENTIAL/PLATELET - Abnormal; Notable for the following components:   WBC 12.7 (*)    RBC 3.26 (*)    Hemoglobin 11.0 (*)    HCT 35.2 (*)    MCV 108.0 (*)    Neutro Abs 11.8 (*)    Lymphs Abs 0.4 (*)    Abs Immature Granulocytes 0.08 (*)    All other components within normal limits  RESP PANEL BY RT-PCR (RSV, FLU A&B, COVID)  RVPGX2  HIV ANTIBODY (ROUTINE TESTING W REFLEX)  CBC  BASIC METABOLIC PANEL WITH GFR  MAGNESIUM  PHOSPHORUS    EKG None  Radiology DG Chest Portable 1 View Result Date: 05/29/2023 CLINICAL DATA:  Shortness of breath EXAM: PORTABLE CHEST 1 VIEW COMPARISON:  Chest x-ray 08/04/2021 FINDINGS: Moderate size left pleural effusion is increased in size and may now be loculated. Dense parenchymal opacities in the left upper lobe and left lower lobe have increased. The heart is enlarged, unchanged. There is no pneumothorax. Additionally there are minimal patchy opacities in the right lung base which are new from prior. Right chest port catheter tip projects over the distal SVC. No acute fractures are seen. IMPRESSION: 1. Moderate size left pleural effusion is increased in size and may now be loculated. 2. Dense parenchymal opacities in the left upper lobe and left lower lobe have increased. Underlying mass not excluded. 3. Minimal new airspace opacities in the right lung base. Electronically Signed   By: Tyron Gallon M.D.   On: 05/29/2023 21:56     Procedures Procedures    Medications Ordered in ED Medications  enoxaparin  (LOVENOX ) injection 60 mg (has no administration in time range)  ipratropium-albuterol  (DUONEB) 0.5-2.5 (3) MG/3ML nebulizer solution 3 mL (has no administration in time range)  acetaminophen  (TYLENOL ) tablet 650 mg (has no administration in time range)  prochlorperazine  (COMPAZINE ) injection 5 mg (has no administration in time range)  melatonin tablet 5 mg (has no administration in time range)  polyethylene  glycol (MIRALAX  / GLYCOLAX ) packet 17 g (has no administration in time range)  oxyCODONE  (Oxy IR/ROXICODONE ) immediate release tablet 5 mg (has no administration in time range)  guaiFENesin -dextromethorphan (ROBITUSSIN DM) 100-10 MG/5ML syrup 5 mL (has no administration in time range)  piperacillin -tazobactam (ZOSYN ) IVPB 3.375 g (has no administration in time range)    ED Course/ Medical Decision Making/ A&P                                 Medical Decision Making Amount and/or Complexity of Data Reviewed Labs: ordered. Radiology: ordered.  Risk Decision regarding hospitalization.    patient with shortness of breath.  Has known lung cancer.  On chronic oxygen  at 2 L.  Had recently been in [place]  been doing well and went down to half a liter a day.  However more hypoxic today.  Sats in the 70s for EMS.  Differential diagnosis does include cause such as pulmonary embolism pleural effusion and pneumonia.  Now on nasal cannula with oxygenation improved.  Will get x-ray and basic blood work.  Reviewed recent CT scan.  Reviewed oncology note from today.  Patient's x-ray does show likely worsening effusion.  With hypoxia will require admission to the hospital.  However patient would like to go to [facility].  Have power shared x-ray and will discuss with the transfer service.   [facility] is at capacity and cannot accept transfers.  Further discussing with patient.  Will get CT scan here to evaluate for  loculated effusion versus drainable collection.  With dyspnea will require admission to the hospital.  Oncology here can help follow also.  Potential transfer later if needed.  Patient states she wants to live and is full code and does not want to go on hospice.  Discussed with Dr. Del Favia from hospitalist, who will admit.          Final Clinical Impression(s) / ED Diagnoses Final diagnoses:  Metastatic malignant neoplasm, unspecified site [facility])    Rx / DC Orders ED Discharge Orders     None         Mozell Arias, MD 05/29/23 2316

## 2023-05-30 ENCOUNTER — Observation Stay (HOSPITAL_COMMUNITY)

## 2023-05-30 DIAGNOSIS — R0602 Shortness of breath: Secondary | ICD-10-CM | POA: Diagnosis present

## 2023-05-30 DIAGNOSIS — Z87891 Personal history of nicotine dependence: Secondary | ICD-10-CM | POA: Diagnosis not present

## 2023-05-30 DIAGNOSIS — J9621 Acute and chronic respiratory failure with hypoxia: Secondary | ICD-10-CM

## 2023-05-30 DIAGNOSIS — Z79899 Other long term (current) drug therapy: Secondary | ICD-10-CM | POA: Diagnosis not present

## 2023-05-30 DIAGNOSIS — C3492 Malignant neoplasm of unspecified part of left bronchus or lung: Secondary | ICD-10-CM | POA: Diagnosis not present

## 2023-05-30 DIAGNOSIS — Z7984 Long term (current) use of oral hypoglycemic drugs: Secondary | ICD-10-CM | POA: Diagnosis not present

## 2023-05-30 DIAGNOSIS — Z1152 Encounter for screening for COVID-19: Secondary | ICD-10-CM | POA: Diagnosis not present

## 2023-05-30 DIAGNOSIS — I10 Essential (primary) hypertension: Secondary | ICD-10-CM | POA: Diagnosis not present

## 2023-05-30 DIAGNOSIS — C799 Secondary malignant neoplasm of unspecified site: Secondary | ICD-10-CM | POA: Diagnosis not present

## 2023-05-30 LAB — CBC
HCT: 34 % — ABNORMAL LOW (ref 36.0–46.0)
Hemoglobin: 10.8 g/dL — ABNORMAL LOW (ref 12.0–15.0)
MCH: 34.2 pg — ABNORMAL HIGH (ref 26.0–34.0)
MCHC: 31.8 g/dL (ref 30.0–36.0)
MCV: 107.6 fL — ABNORMAL HIGH (ref 80.0–100.0)
Platelets: 160 10*3/uL (ref 150–400)
RBC: 3.16 MIL/uL — ABNORMAL LOW (ref 3.87–5.11)
RDW: 14.4 % (ref 11.5–15.5)
WBC: 12.9 10*3/uL — ABNORMAL HIGH (ref 4.0–10.5)
nRBC: 0 % (ref 0.0–0.2)

## 2023-05-30 LAB — BASIC METABOLIC PANEL WITH GFR
Anion gap: 12 (ref 5–15)
BUN: 21 mg/dL — ABNORMAL HIGH (ref 6–20)
CO2: 26 mmol/L (ref 22–32)
Calcium: 9.8 mg/dL (ref 8.9–10.3)
Chloride: 101 mmol/L (ref 98–111)
Creatinine, Ser: 0.89 mg/dL (ref 0.44–1.00)
GFR, Estimated: >60 mL/min (ref >60)
Glucose, Bld: 137 mg/dL — ABNORMAL HIGH (ref 70–99)
Potassium: 4.4 mmol/L (ref 3.5–5.1)
Sodium: 139 mmol/L (ref 135–145)

## 2023-05-30 LAB — MAGNESIUM: Magnesium: 2.2 mg/dL (ref 1.7–2.4)

## 2023-05-30 LAB — HIV ANTIBODY (ROUTINE TESTING W REFLEX): HIV Screen 4th Generation wRfx: NONREACTIVE

## 2023-05-30 LAB — PHOSPHORUS: Phosphorus: 4.5 mg/dL (ref 2.5–4.6)

## 2023-05-30 MED ORDER — MEMANTINE HCL 5 MG PO TABS
10.0000 mg | ORAL_TABLET | Freq: Two times a day (BID) | ORAL | Status: DC
  Administered 2023-05-30: 10 mg via ORAL
  Filled 2023-05-30: qty 2

## 2023-05-30 MED ORDER — LACTATED RINGERS IV SOLN: INTRAVENOUS | Status: DC

## 2023-05-30 MED ORDER — ENOXAPARIN SODIUM 60 MG/0.6ML IJ SOSY
60.0000 mg | PREFILLED_SYRINGE | Freq: Every day | INTRAMUSCULAR | Status: DC
  Administered 2023-05-30: 60 mg via SUBCUTANEOUS
  Filled 2023-05-30: qty 0.6

## 2023-05-30 MED ORDER — FUROSEMIDE 40 MG PO TABS
40.0000 mg | ORAL_TABLET | Freq: Every day | ORAL | Status: DC
  Administered 2023-05-30: 40 mg via ORAL
  Filled 2023-05-30: qty 1

## 2023-05-30 MED ORDER — ONDANSETRON HCL 4 MG PO TABS
4.0000 mg | ORAL_TABLET | Freq: Four times a day (QID) | ORAL | Status: DC | PRN
Start: 2023-05-30 — End: 2023-05-30

## 2023-05-30 MED ORDER — ONDANSETRON HCL 4 MG/2ML IJ SOLN: 4.0000 mg | Freq: Four times a day (QID) | INTRAMUSCULAR | Status: DC | PRN

## 2023-05-30 MED ORDER — FUROSEMIDE 10 MG/ML IJ SOLN
20.0000 mg | Freq: Once | INTRAMUSCULAR | Status: AC
  Administered 2023-05-30: 20 mg via INTRAVENOUS
  Filled 2023-05-30: qty 4

## 2023-05-30 MED ORDER — OXYCODONE HCL 5 MG PO TABS: 10.0000 mg | ORAL_TABLET | ORAL | Status: DC | PRN

## 2023-05-30 MED ORDER — ATORVASTATIN CALCIUM 40 MG PO TABS
40.0000 mg | ORAL_TABLET | Freq: Every day | ORAL | Status: DC
  Administered 2023-05-30: 40 mg via ORAL
  Filled 2023-05-30: qty 1

## 2023-05-30 MED ORDER — SPIRONOLACTONE 25 MG PO TABS
25.0000 mg | ORAL_TABLET | Freq: Every day | ORAL | Status: DC
  Administered 2023-05-30: 25 mg via ORAL
  Filled 2023-05-30: qty 1

## 2023-05-30 MED ORDER — BUPROPION HCL ER (XL) 150 MG PO TB24
150.0000 mg | ORAL_TABLET | Freq: Every day | ORAL | Status: DC
  Administered 2023-05-30: 150 mg via ORAL
  Filled 2023-05-30: qty 1

## 2023-05-30 MED ORDER — VITAMIN D 25 MCG (1000 UNIT) PO TABS
1000.0000 [IU] | ORAL_TABLET | Freq: Every day | ORAL | Status: DC
  Administered 2023-05-30: 1000 [IU] via ORAL
  Filled 2023-05-30: qty 1

## 2023-05-30 MED ORDER — AMIODARONE HCL 200 MG PO TABS
400.0000 mg | ORAL_TABLET | Freq: Every day | ORAL | Status: DC
  Administered 2023-05-30: 400 mg via ORAL
  Filled 2023-05-30: qty 2

## 2023-05-30 MED ORDER — CARVEDILOL 12.5 MG PO TABS
12.5000 mg | ORAL_TABLET | Freq: Two times a day (BID) | ORAL | Status: DC
Start: 2023-05-30 — End: 2023-05-30
  Administered 2023-05-30 (×2): 12.5 mg via ORAL
  Filled 2023-05-30 (×2): qty 1

## 2023-05-30 MED ORDER — LOSARTAN POTASSIUM 25 MG PO TABS
25.0000 mg | ORAL_TABLET | Freq: Every day | ORAL | Status: DC
  Administered 2023-05-30: 25 mg via ORAL
  Filled 2023-05-30: qty 1

## 2023-05-30 MED ORDER — MORPHINE SULFATE ER 15 MG PO TBCR: 15.0000 mg | EXTENDED_RELEASE_TABLET | Freq: Two times a day (BID) | ORAL | Status: DC

## 2023-05-30 MED ORDER — HYDROMORPHONE HCL 1 MG/ML IJ SOLN: 0.5000 mg | INTRAMUSCULAR | Status: DC | PRN

## 2023-05-30 MED ORDER — SODIUM CHLORIDE (PF) 0.9 % IJ SOLN
INTRAMUSCULAR | Status: AC
  Filled 2023-05-30: qty 50

## 2023-05-30 NOTE — ED Notes (Signed)
 Pt accepted @ [facility], unit [facility] Rm [id number]. Report called to [place]. Accepting provider C. Fahey. [facility] called and informed their Tx may be delayed possibly up to 2200.

## 2023-05-30 NOTE — Consult Note (Signed)
 [facility] Initial Consultation Note  Patricia Pena ZOX:[id number] DOB: [birth year] DOA: 05/29/2023  PCP: Jerrlyn Morel, NP   Requesting Physician: Dr. Val Garin  Reason for Consultation: Consider admission for hypoxic respiratory failure  HPI: Patricia Pena is a 58 y.o. female with medical history significant for hypertension, morbid obesity, with metastatic and progressing small cell carcinoma.  She was initially treated starting in April 2023 by Dr. Liam Redhead here, was eventually referred to [facility] where she has had multiple treatment modalities, which unfortunately have not controlled her disease.  She has evidence of continued metastatic disease.  Oncology notes as recent as 4/15 indicate recommendation for hospice, however patient states she is not open to this and wants the most aggressive therapy possible.  She presented to the emergency department with complaints of shortness of breath, which she states has been going on for at least 4 to 5 days has been persistent and progressive.  She denies any fever, no significant cough.  She denies any chest pain.  Per report, [facility] was called for potential transfer overnight, and they had no beds.  As such, hospitalist was contacted overnight for admission and accepted.  I went to see the patient this morning, she remains on high flow oxygen .  She is frustrated that she wants to be admitted to [facility], tells me that she is refusing admission to this hospital.  I discussed with her that I would like to have an IR evaluation with ultrasound for possible thoracentesis, states that she does not want any procedures done here.  I was able to speak on the phone with [facility] oncology as well as the on-call oncology fellow.  They have accepted the patient for transfer, as far as they know they have bed availability.  Review of Systems: Please see HPI for pertinent positives and negatives. A complete 10 system review of systems are otherwise negative.  Past  Medical History:  Diagnosis Date   Arthritis    Cancer (HCC)    Hypertension    Incisional hernia without mention of obstruction or gangrene    Morbid obesity (HCC)    Seizures (HCC)    last one was in 1985   Past Surgical History:  Procedure Laterality Date   ABDOMINAL HYSTERECTOMY  2010   at [facility]   BREAST CYST ASPIRATION Right 2010   BREAST CYST EXCISION  1994   right breast   BREAST EXCISIONAL BIOPSY Right    benign cyst   GALLBLADDER SURGERY  1990   IR IMAGING GUIDED PORT INSERTION  05/31/2021   LEFT HEART CATHETERIZATION WITH CORONARY ANGIOGRAM N/A 06/14/2011   Procedure: LEFT HEART CATHETERIZATION WITH CORONARY ANGIOGRAM;  Surgeon: Dorothye Gathers, MD;  Location: [facility] CATH LAB;  Service: Cardiovascular;  Laterality: N/A;   VAGINA SURGERY  2007    Social History:  reports that she quit smoking about 13 years ago. Her smoking use included cigarettes. She started smoking about 48 years ago. She has a 52.5 pack-year smoking history. She has never used smokeless tobacco. She reports current alcohol use. She reports that she does not use drugs.  Allergies  Allergen Reactions   Codeine Other (See Comments)    headache    Family History  Problem Relation Age of Onset   Cancer Father        lung   Breast cancer Paternal Aunt      Prior to Admission medications   Medication Sig Start Date End Date Taking? Authorizing Provider  amiodarone  (PACERONE ) 400  MG tablet Take 400 mg by mouth daily. 03/20/23 03/19/24 Yes [provider]  atorvastatin  (LIPITOR) 40 MG tablet Take 40 mg by mouth daily.   Yes [provider]  buPROPion  (WELLBUTRIN  XL) 150 MG 24 hr tablet Take 1 tablet by mouth daily. 03/16/23 03/15/24 Yes [provider]  CALCIUM -VITAMIN D  PO Take 2,000 mg by mouth 2 (two) times daily.   Yes [provider]  carvedilol  (COREG ) 12.5 MG tablet Take 12.5 mg by mouth 2 (two) times daily with a meal. 04/06/23 05/10/24 Yes [provider]   Cholecalciferol  (VITAMIN D -1000 MAX ST) 25 MCG (1000 UT) tablet Take 1,000 Units by mouth daily.   Yes [provider]  dronabinol (MARINOL) 5 MG capsule Take 5 mg by mouth as needed (Appitite). 02/27/23  Yes [provider]  furosemide  (LASIX ) 40 MG tablet Take 1 tablet by mouth daily. 10/27/22 05/20/24 Yes [provider]  JARDIANCE 10 MG TABS tablet Take 10 mg by mouth daily.   Yes [provider]  lidocaine -prilocaine  (EMLA ) cream Apply to the Port-A-Cath site 30 minutes before chemotherapy 05/24/21  Yes Marlene Simas, MD  loperamide  (IMODIUM ) 2 MG capsule Take 2 mg by mouth as needed for diarrhea or loose stools. Take 2 capsules  after first diarrhea stool, then take one capsule after each subsequent stool up to 6-8/day   Yes [provider]  losartan  (COZAAR ) 25 MG tablet Take 25 mg by mouth daily.   Yes [provider]  magnesium 30 MG tablet Take 30 mg by mouth 2 (two) times daily.   Yes [provider]  memantine  (NAMENDA ) 10 MG tablet Take 10 mg by mouth 2 (two) times daily. 11/22/22 11/17/23 Yes [provider]  morphine  (MS CONTIN ) 15 MG 12 hr tablet Take 15 mg by mouth every 12 (twelve) hours. 01/30/23  Yes [provider]  ondansetron  (ZOFRAN ) 8 MG tablet Take 1 tablet (8 mg total) by mouth every 8 (eight) hours as needed for nausea or vomiting. 07/11/21  Yes Heilingoetter, Cassandra L, PA-C  Oxycodone  HCl 10 MG TABS Take 10 mg by mouth every 4 (four) hours as needed (pain). 10/02/22  Yes [provider]  predniSONE  (DELTASONE ) 10 MG tablet Take 4 tabs for 2 days, then 3 tabs for 2 days, then 2 tabs for 2 days, then 1 tab for 2 days, then stop Patient taking differently: as needed. Take 4 tabs for 2 days, then 3 tabs for 2 days, then 2 tabs for 2 days, then 1 tab for 2 days, then stop 01/24/23  Yes Jerrlyn Morel, NP  spironolactone  (ALDACTONE ) 25 MG tablet Take 25 mg by mouth daily.   Yes [provider]  temozolomide (TEMODAR) 100 MG capsule Take 100 mg by mouth daily. 04/20/23  Yes [provider]  Cyanocobalamin (VITAMIN B-12 PO) Take 1 tablet by mouth daily. Patient not taking: Reported on 06/20/2021    [provider]  Desoximetasone 0.05 % OINT Apply 1 application. topically daily as needed (For skin rash). 04/07/17   [provider]  hydrochlorothiazide  (MICROZIDE ) 12.5 MG capsule Take 1 capsule (12.5 mg total) by mouth daily. Patient not taking: Reported on 05/30/2023 03/22/22   Jerrlyn Morel, NP  hydrOXYzine  (ATARAX ) 10 MG tablet Take 1 tablet (10 mg total) by mouth 3 (three) times daily as needed. Patient not taking: Reported on 03/22/2022 05/20/21   Jerrlyn Morel, NP  LORazepam  (ATIVAN ) 1 MG tablet 1 tablet 30 minutes before MRI  of the brain. Patient not taking: Reported on 05/30/2023 06/13/21   Marlene Simas, MD  metoprolol succinate (TOPROL-XL) 50 MG 24 hr tablet Take 50 mg by mouth at bedtime. Patient not taking: Reported on 05/30/2023    [provider]  potassium chloride  SA (KLOR-CON  M) 20 MEQ tablet Take 2 tablets (40 mEq total) by mouth daily. Patient not taking: Reported on 01/24/2023 08/15/21   Marlene Simas, MD  prochlorperazine  (COMPAZINE ) 10 MG tablet Take 1 tablet (10 mg total) by mouth every 6 (six) hours as needed. Patient not taking: Reported on 05/30/2023 07/11/21   Heilingoetter, Cassandra L, PA-C  temozolomide (TEMODAR) 250 MG capsule Take 250 mg by mouth daily. Patient not taking: Reported on 05/30/2023 04/20/23   [provider]    Physical Exam: BP (!) 145/88   Pulse 86   Temp 97.9 F (36.6 C)   Resp (!) 25   Ht 5\' 3"  (1.6 m)   Wt 117.9 kg   SpO2 99%   BMI 46.06 kg/m   General:  Alert, oriented, calm, in no acute distress, frustrated.  Morbidly obese.  Speaking in full sentences, no cough, looks nontoxic.  She is wearing 10 L high flow oxygen . Cardiovascular: RRR, no murmurs or rubs, no  peripheral edema  Respiratory: clear to auscultation bilaterally, no wheezes, no crackles  Abdomen: soft, nontender, nondistended, normal bowel tones heard  Skin: dry, no rashes  Musculoskeletal: no joint effusions, normal range of motion  Psychiatric: appropriate affect, normal speech  Neurologic: extraocular muscles intact, clear speech, moving all extremities with intact sensorium         Recent Labs and Imaging Reviewed:  Basic Metabolic Panel: Recent Labs  Lab 05/29/23 2132 05/30/23 0512  NA 138 139  K 4.2 4.4  CL 102 101  CO2 26 26  GLUCOSE 149* 137*  BUN 18 21*  CREATININE 0.89 0.89  CALCIUM  9.6 9.8  MG  --  2.2  PHOS  --  4.5   Liver Function Tests: Recent Labs  Lab 05/29/23 2132  AST 20  ALT 14  ALKPHOS 78  BILITOT 0.5  PROT 8.1  ALBUMIN 3.8   No results for input(s): "LIPASE", "AMYLASE" in the last 168 hours. No results for input(s): "AMMONIA" in the last 168 hours. CBC: Recent Labs  Lab 05/29/23 2132 05/30/23 0512  WBC 12.7* 12.9*  NEUTROABS 11.8*  --   HGB 11.0* 10.8*  HCT 35.2* 34.0*  MCV 108.0* 107.6*  PLT 166 160   Cardiac Enzymes: No results for input(s): "CKTOTAL", "CKMB", "CKMBINDEX", "TROPONINI" in the last 168 hours.  BNP (last 3 results) No results for input(s): "BNP" in the last 8760 hours.  ProBNP (last 3 results) No results for input(s): "PROBNP" in the last 8760 hours.  CBG: No results for input(s): "GLUCAP" in the last 168 hours.  Radiological Exams on Admission: CT Chest W Contrast Result Date: 05/30/2023 CLINICAL DATA:  Small cell lung carcinoma.  Pleural effusion. EXAM: CT CHEST WITH CONTRAST TECHNIQUE: Multidetector CT imaging of the chest was performed during intravenous contrast administration. RADIATION DOSE REDUCTION: This exam was performed according to the departmental dose-optimization program which includes automated exposure control, adjustment of the mA and/or kV according to patient size and/or use of iterative  reconstruction technique. CONTRAST:  75mL OMNIPAQUE  IOHEXOL  300 MG/ML  SOLN COMPARISON:  08/15/2021 FINDINGS: Cardiovascular: Calcific aortic atherosclerosis.  Normal heart size. Mediastinum/Nodes: 17 mm precarinal lymph node. 25 mm subcarinal node. 20 mm left cardiophrenic node/soft tissue implant. Lungs/Pleura: Widespread  nodular and crenulated left pleural thickening. Ground-glass opacity throughout much of the right colon. The airways are patent. Upper Abdomen: Multiple enlarged upper abdominal lymph nodes that measure up to 25 mm. Unchanged left adrenal adenoma. Musculoskeletal: No chest wall abnormality. No acute or significant osseous findings. IMPRESSION: 1. Widespread nodular and crenulated left pleural thickening, consistent with pleural metastatic disease. 2. Mediastinal and upper abdominal lymphadenopathy and/or metastatic soft tissue implants. 3. Ground-glass opacity throughout much of the right lung, which may indicate pulmonary edema. 4. Unchanged left adrenal adenoma. Aortic Atherosclerosis (ICD10-I70.0). Electronically Signed   By: Juanetta Nordmann M.D.   On: 05/30/2023 03:49   DG Chest Portable 1 View Result Date: 05/29/2023 CLINICAL DATA:  Shortness of breath EXAM: PORTABLE CHEST 1 VIEW COMPARISON:  Chest x-ray 08/04/2021 FINDINGS: Moderate size left pleural effusion is increased in size and may now be loculated. Dense parenchymal opacities in the left upper lobe and left lower lobe have increased. The heart is enlarged, unchanged. There is no pneumothorax. Additionally there are minimal patchy opacities in the right lung base which are new from prior. Right chest port catheter tip projects over the distal SVC. No acute fractures are seen. IMPRESSION: 1. Moderate size left pleural effusion is increased in size and may now be loculated. 2. Dense parenchymal opacities in the left upper lobe and left lower lobe have increased. Underlying mass not excluded. 3. Minimal new airspace opacities in the  right lung base. Electronically Signed   By: Tyron Gallon M.D.   On: 05/29/2023 21:56    Summary and Recommendations: Patricia Pena is a 58 y.o. female with medical history significant for hypertension, morbid obesity, with metastatic and progressing small cell carcinoma.  She presents with acute on chronic hypoxic respiratory failure, possibly due to deep disease progression, pleural effusion, pneumonia or perhaps a combination of the above.  I am concerned that the patient is disease progression despite aggressive treatment.  She remains a full code.  She would most likely benefit from a goals of care discussion, and it may be most appropriate to change her CODE STATUS however patient is not receptive to these conversations currently, strongly prefers to be evaluated by her oncology team at [facility]. -Continue empiric IV Zosyn  -Continue oxygen  supplementation -Orders have been placed for IR thoracentesis which may be therapeutic if indeed she does have an effusion -Will schedule DuoNebs, with as needed albuterol  -She has a mild leukocytosis, but has been on steroids -CT without airway obstruction  I was able to speak on the phone and discuss her case with oncology fellow at [facility]. They have no further management recommendations for the time being, and have accepted the patient for transfer.  They will call back once they confirm bed availability, state that beds were available when they last checked this morning.  Thank you for involving us  in the care of your patient.  [facility] will continue to follow along with you until transfer.    Code Status: Full Code  Time spent: 75 minutes  Graeson Nouri Rickey Charm MD [facility] Pager [phone]  If 7PM-7AM, please contact night-coverage [email or web address] Password [facility]  05/30/2023, 10:43 AM

## 2023-05-30 NOTE — Progress Notes (Signed)
 Request to IR for thoracentesis, per patient's RN and admission note she is declining thoracentesis. She is planned for transfer to [facility] where she would like to have any necessary procedures done.  Order for thoracentesis will be cancelled, please place a new order if patient is agreeable to thoracentesis here at [facility].  Nathan Bake, PA-C

## 2023-05-30 NOTE — Plan of Care (Signed)
 Multiple conversations with [facility] oncology team.  Per their request, patient was given IV lasix  and O2 was weaned to 8L [place].   Patient re-examined twice this AM, and also discussed with her son at bedside.   She has remained stable and is saturating 98% on 8L [place].  She has been accepted for transfer to [facility].  Patient and son updated at bedside.

## 2023-06-01 ENCOUNTER — Other Ambulatory Visit: Payer: Self-pay

## 2023-06-07 DEATH — deceased

## 2023-11-23 ENCOUNTER — Telehealth: Payer: Self-pay

## 2023-11-23 DIAGNOSIS — C801 Malignant (primary) neoplasm, unspecified: Secondary | ICD-10-CM

## 2023-11-23 NOTE — Progress Notes (Unsigned)
 Complex Care Management Note Care Guide Note  11/23/2023 Name: Patricia Pena MRN: [id number] DOB: 1966-01-02   Complex Care Management Outreach Attempts: An unsuccessful telephone outreach was attempted today to offer the patient information about available complex care management services.  Follow Up Plan:  Additional outreach attempts will be made to offer the patient complex care management information and services.   Encounter Outcome:  No Answer  Dreama [facility]  [facility], [facility] VBCI Assistant Direct Dial: [phone]  Fax: [phone]

## 2023-11-27 NOTE — Progress Notes (Unsigned)
 Complex Care Management Note Care Guide Note  11/27/2023 Name: Patricia Pena MRN: [id number] DOB: 10/22/1965   Complex Care Management Outreach Attempts: A second unsuccessful outreach was attempted today to offer the patient with information about available complex care management services.  Follow Up Plan:  Additional outreach attempts will be made to offer the patient complex care management information and services.   Encounter Outcome:  No Answer  Dreama [facility]  [facility], [facility] VBCI Assistant Direct Dial: [phone]  Fax: [phone]

## 2023-11-29 NOTE — Progress Notes (Signed)
 Complex Care Management Note Care Guide Note  11/29/2023 Name: Patricia Pena MRN: [id number] DOB: [birth year]   Complex Care Management Outreach Attempts: A third unsuccessful outreach was attempted today to offer the patient with information about available complex care management services.  Follow Up Plan:  No further outreach attempts will be made at this time. We have been unable to contact the patient to offer or enroll patient in complex care management services.  Encounter Outcome:  No Answer  Dreama [facility]  [facility], [facility] VBCI Assistant Direct Dial: [phone]  Fax: [phone]
